# Patient Record
Sex: Female | Born: 1955
Health system: Southern US, Community
[De-identification: ages and names within clinical notes are randomized; demographics above are authoritative.]

## PROBLEM LIST (undated history)

## (undated) DIAGNOSIS — I10 Essential (primary) hypertension: Secondary | ICD-10-CM

## (undated) DIAGNOSIS — E0500 Thyrotoxicosis with diffuse goiter without thyrotoxic crisis or storm: Secondary | ICD-10-CM

## (undated) DIAGNOSIS — E039 Hypothyroidism, unspecified: Secondary | ICD-10-CM

## (undated) DIAGNOSIS — I8390 Asymptomatic varicose veins of unspecified lower extremity: Secondary | ICD-10-CM

## (undated) DIAGNOSIS — M1990 Unspecified osteoarthritis, unspecified site: Secondary | ICD-10-CM

## (undated) DIAGNOSIS — K5730 Diverticulosis of large intestine without perforation or abscess without bleeding: Secondary | ICD-10-CM

## (undated) DIAGNOSIS — J309 Allergic rhinitis, unspecified: Secondary | ICD-10-CM

## (undated) DIAGNOSIS — G4730 Sleep apnea, unspecified: Secondary | ICD-10-CM

## (undated) DIAGNOSIS — I519 Heart disease, unspecified: Secondary | ICD-10-CM

## (undated) DIAGNOSIS — I272 Pulmonary hypertension, unspecified: Secondary | ICD-10-CM

## (undated) DIAGNOSIS — M549 Dorsalgia, unspecified: Secondary | ICD-10-CM

## (undated) DIAGNOSIS — G8929 Other chronic pain: Secondary | ICD-10-CM

## (undated) DIAGNOSIS — J45990 Exercise induced bronchospasm: Secondary | ICD-10-CM

## (undated) DIAGNOSIS — E669 Obesity, unspecified: Secondary | ICD-10-CM

## (undated) DIAGNOSIS — I5032 Chronic diastolic (congestive) heart failure: Secondary | ICD-10-CM

## (undated) DIAGNOSIS — K219 Gastro-esophageal reflux disease without esophagitis: Secondary | ICD-10-CM

## (undated) HISTORY — DX: Morbid (severe) obesity due to excess calories: E66.01

## (undated) HISTORY — DX: Allergic rhinitis, unspecified: J30.9

## (undated) HISTORY — DX: Dorsalgia, unspecified: M54.9

## (undated) HISTORY — PX: ABDOMINAL HYSTERECTOMY: SHX81

## (undated) HISTORY — PX: FOOT ARTHROPLASTY: SHX1657

## (undated) HISTORY — DX: Heart disease, unspecified: I51.9

## (undated) HISTORY — PX: CHOLECYSTECTOMY: SHX55

## (undated) HISTORY — DX: Obesity, unspecified: E66.9

## (undated) HISTORY — DX: Exercise induced bronchospasm: J45.990

## (undated) HISTORY — PX: VARICOSE VEIN SURGERY: SHX832

## (undated) HISTORY — DX: Chronic diastolic (congestive) heart failure: I50.32

## (undated) HISTORY — PX: EYE SURGERY: SHX253

## (undated) HISTORY — PX: APPENDECTOMY: SHX54

## (undated) HISTORY — DX: Pulmonary hypertension, unspecified: I27.20

## (undated) HISTORY — DX: Thyrotoxicosis with diffuse goiter without thyrotoxic crisis or storm: E05.00

## (undated) HISTORY — PX: UPPER GASTROINTESTINAL ENDOSCOPY: SHX188

## (undated) HISTORY — DX: Other chronic pain: G89.29

---

## 1992-04-12 HISTORY — PX: BREAST BIOPSY: SHX20

## 2004-06-02 ENCOUNTER — Encounter: Admission: RE | Admit: 2004-06-02 | Discharge: 2004-06-02 | Payer: Self-pay | Admitting: Obstetrics and Gynecology

## 2005-03-24 ENCOUNTER — Encounter: Admission: RE | Admit: 2005-03-24 | Discharge: 2005-03-24 | Payer: Self-pay | Admitting: Obstetrics and Gynecology

## 2005-08-24 ENCOUNTER — Encounter (HOSPITAL_COMMUNITY): Admission: RE | Admit: 2005-08-24 | Discharge: 2005-11-22 | Payer: Self-pay | Admitting: Family Medicine

## 2005-09-20 ENCOUNTER — Encounter: Admission: RE | Admit: 2005-09-20 | Discharge: 2005-09-20 | Payer: Self-pay | Admitting: Family Medicine

## 2005-11-22 ENCOUNTER — Ambulatory Visit: Payer: Self-pay | Admitting: Internal Medicine

## 2006-03-07 ENCOUNTER — Ambulatory Visit: Payer: Self-pay | Admitting: Internal Medicine

## 2006-04-18 ENCOUNTER — Ambulatory Visit (HOSPITAL_COMMUNITY): Admission: RE | Admit: 2006-04-18 | Discharge: 2006-04-18 | Payer: Self-pay | Admitting: Family Medicine

## 2006-08-09 ENCOUNTER — Encounter: Admission: RE | Admit: 2006-08-09 | Discharge: 2006-11-07 | Payer: Self-pay | Admitting: Family Medicine

## 2007-05-24 ENCOUNTER — Ambulatory Visit (HOSPITAL_COMMUNITY): Admission: RE | Admit: 2007-05-24 | Discharge: 2007-05-24 | Payer: Self-pay | Admitting: Gastroenterology

## 2007-05-24 ENCOUNTER — Encounter (INDEPENDENT_AMBULATORY_CARE_PROVIDER_SITE_OTHER): Payer: Self-pay | Admitting: Gastroenterology

## 2007-09-11 ENCOUNTER — Ambulatory Visit (HOSPITAL_COMMUNITY): Admission: RE | Admit: 2007-09-11 | Discharge: 2007-09-11 | Payer: Self-pay | Admitting: Family Medicine

## 2007-11-19 ENCOUNTER — Emergency Department (HOSPITAL_COMMUNITY): Admission: EM | Admit: 2007-11-19 | Discharge: 2007-11-20 | Payer: Self-pay | Admitting: Emergency Medicine

## 2007-12-04 ENCOUNTER — Emergency Department (HOSPITAL_COMMUNITY): Admission: EM | Admit: 2007-12-04 | Discharge: 2007-12-04 | Payer: Self-pay | Admitting: Emergency Medicine

## 2008-05-03 ENCOUNTER — Ambulatory Visit (HOSPITAL_COMMUNITY): Admission: RE | Admit: 2008-05-03 | Discharge: 2008-05-03 | Payer: Self-pay | Admitting: Gastroenterology

## 2008-05-08 ENCOUNTER — Encounter (INDEPENDENT_AMBULATORY_CARE_PROVIDER_SITE_OTHER): Payer: Self-pay | Admitting: Gastroenterology

## 2008-05-08 ENCOUNTER — Ambulatory Visit (HOSPITAL_COMMUNITY): Admission: RE | Admit: 2008-05-08 | Discharge: 2008-05-08 | Payer: Self-pay | Admitting: Gastroenterology

## 2008-08-14 ENCOUNTER — Ambulatory Visit (HOSPITAL_COMMUNITY): Admission: RE | Admit: 2008-08-14 | Discharge: 2008-08-14 | Payer: Self-pay | Admitting: Gastroenterology

## 2008-10-28 ENCOUNTER — Ambulatory Visit (HOSPITAL_COMMUNITY): Admission: RE | Admit: 2008-10-28 | Discharge: 2008-10-28 | Payer: Self-pay | Admitting: Family Medicine

## 2008-11-25 ENCOUNTER — Encounter: Payer: Self-pay | Admitting: Internal Medicine

## 2009-04-21 ENCOUNTER — Emergency Department (HOSPITAL_COMMUNITY): Admission: EM | Admit: 2009-04-21 | Discharge: 2009-04-21 | Payer: Self-pay | Admitting: Family Medicine

## 2009-11-18 ENCOUNTER — Ambulatory Visit (HOSPITAL_COMMUNITY): Admission: RE | Admit: 2009-11-18 | Discharge: 2009-11-18 | Payer: Self-pay | Admitting: Family Medicine

## 2010-01-30 ENCOUNTER — Ambulatory Visit (HOSPITAL_COMMUNITY): Admission: RE | Admit: 2010-01-30 | Discharge: 2010-01-30 | Payer: Self-pay | Admitting: Orthopaedic Surgery

## 2010-05-03 ENCOUNTER — Encounter: Payer: Self-pay | Admitting: Family Medicine

## 2010-08-25 NOTE — Op Note (Signed)
NAME:  Melanie Hood, Melanie Hood                  ACCOUNT NO.:  [id number]   MEDICAL RECORD NO.:  [id number]          PATIENT TYPE:  AMB   LOCATION:  ENDO                         FACILITY:  [facility]   PHYSICIAN:  Anselmo Rod, M.D.  DATE OF BIRTH:  03-Jan-1956   DATE OF PROCEDURE:  05/24/2007  DATE OF DISCHARGE:  05/24/2007                               OPERATIVE REPORT   PROCEDURE PERFORMED:  Colonoscopy with cold biopsies x 2.   ENDOSCOPIST:  Anselmo Rod, M.D.   INSTRUMENT USED:  Pentax video colonoscope.   INDICATIONS FOR PROCEDURE:  A 55 year old [place] American female with a  family history of colon cancer in a sister undergoing screening  colonoscopy to rule out colonic polyps, mass, etc.   PREPROCEDURE PREPARATION:  Informed consent was procured from the  patient.  The patient fasted for 8 hours prior to the procedure after  being prepped with a bottle of magnesium citrate and a gallon of  NuLytely the night prior to the procedure.  The risks and benefits of  the procedure including a 10% miss rate of cancer and polyp were  discussed with the patient as well.   PREPROCEDURE PHYSICAL:  The patient had stable vital signs.  Neck  supple.  Chest clear to auscultation.  S1, S2 regular.  Abdomen soft  with normal bowel sounds.   DESCRIPTION OF PROCEDURE:  The patient was placed in left lateral  decubitus position and sedated with an additional 75 mcg of Fentanyl and  6 mg of Versed given intravenously in slow incremental doses.  Once the  patient was adequately sedated and maintained on low-flow oxygen and  continuous cardiac monitoring, the Pentax video colonoscope was advanced  from the rectum to cecum.  The appendiceal orifice and ileocecal valve  were clearly visualized and photographed.  The patient had very  redundant colon.  The procedure was technically difficult due to her  large abdomen.  Sigmoid diverticulosis was noted.  The polyp was  biopsied at 50 cm.  Retroflexion in the  rectum revealed no  abnormalities.  The patient tolerated the procedure well without  complications.  The appendiceal orifice and ileocecal valve were  visualized and photographed.   IMPRESSION:  1. Sigmoid diverticulosis.  2. Small sessile polyp biopsied at 50 cm.  3. No abnormalities noted on retroflexion.   RECOMMENDATIONS:  1. Await pathology results.  2. Repeat colonoscopy depending on pathology results.  3. Continue high fiber diet with liberal fluid intake.  4. Outpatient follow-up as need arises in the future.  5. Avoid all nonsteroidals for the next two weeks.      Anselmo Rod, M.D.  Electronically Signed     JNM/MEDQ  D:  05/30/2007  T:  05/31/2007  Job:  [id number]   cc:   Pam Drown, M.D.  Fax: [phone]

## 2010-08-25 NOTE — Op Note (Signed)
NAME:  Melanie Hood, Melanie Hood                  ACCOUNT NO.:  [id number]   MEDICAL RECORD NO.:  [id number]          PATIENT TYPE:  AMB   LOCATION:  ENDO                         FACILITY:  [facility]   PHYSICIAN:  Anselmo Rod, M.D.  DATE OF BIRTH:  04-23-1955   DATE OF PROCEDURE:  05/24/2007  DATE OF DISCHARGE:  05/24/2007                               OPERATIVE REPORT   PROCEDURE PERFORMED:  Esophagogastroduodenoscopy with multiple cold  biopsies.   ENDOSCOPIST:  Anselmo Rod, MD   INSTRUMENT USED:  Pentax video panendoscope.   INDICATIONS FOR PROCEDURE:  A 55 year old [place] American female with a  history of epigastric pain undergoing EGD to rule out peptic ulcer  disease, esophagitis, gastritis, etc.   PREPROCEDURE PREPARATION:  Informed consent was procured from the  patient.  The patient fasted for 8 hours prior to the procedure.  Risks  and benefits of the procedure were discussed with the patient in detail.   PREPROCEDURE PHYSICAL:  The patient had stable vital signs.  Neck  supple.  Chest clear to auscultation.  S1, S2 regular.  Abdomen soft  with normal bowel sounds.   DESCRIPTION OF PROCEDURE:  The patient was placed in the left lateral  decubitus position and sedated with 75 mcg of Fentanyl and 8 mg of  Versed given intravenously in slow incremental doses.  Once the patient  was adequately sedated and maintained on low flow oxygen and continuous  cardiac monitoring the Pentax video panendoscope was advanced through  the mouthpiece over the tongue into the esophagus under direct vision.  The vocal cords appeared healthy.  The entire esophagus was widely  patent.  A small patch of pinkish mucosa was biopsied above the Z-line  to rule out Barrett's esophagus.  The scope was advanced in the stomach.  There was no evidence of a hiatal hernia.  Antral gastritis was noted.  Biopsies were done to rule out presence of Helicobacter pylori by  CLOtest.  The proximal small bowel appeared  normal.  There was no  obstruction.  The patient tolerated the procedure well without  complications.   IMPRESSION:  1. Small patch of pinkish mucosa above the Z-line, biopsied to rule      out Barrett's.  2. Antral gastritis CLOtest done.  3. Normal proximal small bowel.   RECOMMENDATIONS:  1. Await pathology results.  2. Treat with antibiotics if it is CLOtest positive.  3. Proceed with colonoscopy at this time.  Further recommendations      made thereafter.      Anselmo Rod, M.D.  Electronically Signed     JNM/MEDQ  D:  05/30/2007  T:  05/31/2007  Job:  [id number]   cc:   Pam Drown, M.D.  Fax: [phone]

## 2010-08-25 NOTE — Op Note (Signed)
NAME:  Melanie Hood, Melanie Hood                  ACCOUNT NO.:  [id number]   MEDICAL RECORD NO.:  [id number]          PATIENT TYPE:  AMB   LOCATION:  ENDO                         FACILITY:  [facility]   PHYSICIAN:  Anselmo Rod, M.D.  DATE OF BIRTH:  [birth year]   DATE OF PROCEDURE:  05/08/2008  DATE OF DISCHARGE:                               OPERATIVE REPORT   PROCEDURE PERFORMED:  Esophagogastroduodenoscopy with esophageal  biopsies.   ENDOSCOPIST:  Dr. Anselmo Rod.   INSTRUMENT USED:  Pentax video panendoscope.   INDICATIONS FOR PROCEDURE:  A 55 year old black female undergoing an EGD  for dysphagia and abdominal pain, rule out peptic ulcer disease,  esophagitis, gastritis, etc.   PREPROCEDURE PREPARATION:  Informed consent was procured from the  patient.  The patient fasted for 8 hours prior to the procedure.  The  risks and benefits of the procedure were discussed with the patient in  great detail.   PREPROCEDURE PHYSICAL:  VITAL SIGNS:  The patient has stable vital  signs.  NECK:  Supple.  CHEST:  Clear to auscultation.  S1-S2 regular.  ABDOMEN:  Soft with normal bowel sounds.   DESCRIPTION OF PROCEDURE:  The patient was placed in the left lateral  decubitus position and sedated with 100 mcg of Fentanyl and 9 mg of  Versed given intravenously in slow incremental doses.  She had gargled  with 10 mL of 2% viscous lidocaine prior to the endoscopy being started.  Once the patient was adequately positioned and maintained on low-flow  oxygen and continuous cardiac monitoring, the Pentax video panendoscope  was advanced through the mouthpiece over the tongue into the esophagus  under direct vision.  The proximal and the mid esophagus appeared  normal.  A small patch of Barrett's mucosa was biopsied above the Z-line  to rule out dysplasia.  Distal esophageal biopsies were also done to  rule out eosinophilic esophagitis.  The gastric mucosa and the proximal  small bowel appeared normal.   There was no outlet obstruction.  The  patient tolerated the procedure well without complications.   IMPRESSION:  1. Widely patent esophagus with Barrett's like changes in the distal      esophagus.  Biopsies done to rule out dysplasia.  2. Distal esophageal biopsies done to rule out eosinophilic      esophagitis.  3. Normal-appearing gastric mucosa and proximal bowel.   RECOMMENDATIONS:  1. Await pathology results.  2. Avoid all nonsteroidals for now.  3. Avoid very hot or very cold liquids to prevent esophageal spasm.  4. Continue PPIs.  5. Outpatient follow-up in the next 2 weeks for further      recommendations.      Anselmo Rod, M.D.  Electronically Signed     JNM/MEDQ  D:  05/08/2008  T:  05/08/2008  Job:  [id number]   cc:   Pam Drown, M.D.  Fax: [phone]

## 2010-08-28 NOTE — Assessment & Plan Note (Signed)
[facility]                              CARDIOLOGY OFFICE NOTE   NAME:Mulvaney, Melanie Hood                         MRN:          [id number]  DATE:11/22/2005                            DOB:          04-Oct-1955    IDENTIFICATION:  The patient is a 55 year old woman who was referred by Dr.  Alberteen Sam for continued cardiac care.   The patient has a history of chest discomfort.  She has had intermittent  chest discomfort in the past.  She has had treadmill tests done.  The latest  was in 2004 that was negative for ischemia.  The patient walked into stage 3  of the Bruce protocol.   On talking about the chest discomfort the patient says this is more recent.  The chest pressure can come and go with and without activity.  Sometimes it  is a pressure.  Sometimes it is a pain.  She notes occasional reflux.  Does  not think she has been bothered by it recently.   She also has palpitations.  She has had these in the past.  She has been  told she has mitral valve prolapse attributed to this.  Echocardiogram again  in 1993 showed mild MVP, but the one prior did not.  She says these are not  too bad.   Her biggest complaint is she was diagnosed with Grave's disease this spring,  underwent radiation therapy and is being followed as in the recovery.  For  this, she is followed by Dr. Marisa Sprinkles at [place].   The patient just now is beginning to really exercise.  She denies any  problems right now with this.  No chest pain with activity.   ALLERGIES:  Erythromycin, tetracycline, NSAIDs, Tylenol with codeine.   CURRENT MEDICATIONS:  Claritin-D, multivitamin, cod liver oil and calcium.   PAST MEDICAL HISTORY:  MVP by report.  Occasional palpitations.  Allergies.  Grave's disease.   PAST SURGICAL HISTORY:  Tonsillectomy, vein ligation, gallbladder surgery,  appendectomy, hysterectomy.   FAMILY HISTORY:  Grandfather had coronary artery disease.  Had an MI in his  45s.   Was a smoker.  Grandmother had CHF question nonischemic versus  ischemic.  No records.  Mother and father are not alive.  No known history  of heart disease.  One sister alive.  One sister died of colon cancer.  One  brother alive.  One brother deceased.   SOCIAL HISTORY:  The patient is a Engineer, civil (consulting) at [employer].  She moved down  here about 2 years ago.  Quit tobacco in 1998.  Had a history of remote  marijuana use.  Drinks alcohol rarely.   REVIEW OF SYSTEMS:  All systems reviewed and are negative to the above  problem except as noted above.   PHYSICAL EXAMINATION:  GENERAL:  The patient is in no distress.  VITAL SIGNS:  Blood pressure 126/88, pulse 71 and regular.  Weight 261  pounds.  LUNGS:  Clear.  NECK:  JVP is normal.  No bruits.  CARDIAC:  Regular rate and rhythm.  S1 and S2.  No S3.  No significant  murmurs.  ABDOMEN:  Benign.  EXTREMITIES:  Good distal pulses.   A 12-lead EKG:  Normal sinus rhythm at 71 beats/min.   IMPRESSION:  1. Melanie Hood is a 55 year old woman with no known history of coronary artery      disease.  She has intermittent chest pressure that I am not convinced      is cardiac.  It does not go along with this as it comes and goes with      and without activity.  She is working out and seems to be tolerating      it.  Thyroid has thrown things off of it in interpreting because she      got short of breath when her thyroid was way off.  I do not know what      the status is now, but she is beginning to work out and tolerating it.      I will set followup to December to see how she is doing.  2. Mitral valve prolapse.  On exam I do not see any evidence of this.      Indeed if she has it it must be a very mild case, and we have discussed      this.  3. Palpitations.  Seems to be tolerating.  Do not seem to be severe.   I would like to have the patient bring her lipid panel the next time she  comes.                                Pricilla Riffle, MD,  [facility]    PVR/MedQ  DD:  11/22/2005  DT:  11/23/2005  Job #:  [id number]   cc:   Birdena Jubilee, MD  Holli Humbles

## 2010-12-11 ENCOUNTER — Other Ambulatory Visit (HOSPITAL_COMMUNITY): Payer: Self-pay | Admitting: Family Medicine

## 2010-12-11 DIAGNOSIS — Z1231 Encounter for screening mammogram for malignant neoplasm of breast: Secondary | ICD-10-CM

## 2010-12-21 ENCOUNTER — Ambulatory Visit (HOSPITAL_COMMUNITY)
Admission: RE | Admit: 2010-12-21 | Discharge: 2010-12-21 | Disposition: A | Discharge status: Home / Self Care | Payer: 59 | Source: Ambulatory Visit | Attending: Family Medicine | Admitting: Family Medicine

## 2010-12-21 DIAGNOSIS — Z1231 Encounter for screening mammogram for malignant neoplasm of breast: Secondary | ICD-10-CM | POA: Insufficient documentation

## 2011-01-01 LAB — CLOTEST (H. PYLORI), BIOPSY: Helicobacter screen: NEGATIVE

## 2011-03-18 ENCOUNTER — Other Ambulatory Visit (HOSPITAL_COMMUNITY)
Admission: RE | Admit: 2011-03-18 | Discharge: 2011-03-18 | Disposition: A | Discharge status: Home / Self Care | Payer: 59 | Source: Ambulatory Visit | Attending: Family Medicine | Admitting: Family Medicine

## 2011-03-18 ENCOUNTER — Other Ambulatory Visit: Payer: Self-pay | Admitting: Family Medicine

## 2011-03-18 DIAGNOSIS — Z124 Encounter for screening for malignant neoplasm of cervix: Secondary | ICD-10-CM | POA: Insufficient documentation

## 2011-03-25 ENCOUNTER — Other Ambulatory Visit: Payer: Self-pay | Admitting: Orthopaedic Surgery

## 2011-03-31 ENCOUNTER — Other Ambulatory Visit (HOSPITAL_COMMUNITY): Payer: Self-pay | Admitting: Gastroenterology

## 2011-04-08 ENCOUNTER — Ambulatory Visit (HOSPITAL_COMMUNITY)
Admission: RE | Admit: 2011-04-08 | Discharge: 2011-04-08 | Disposition: A | Discharge status: Home / Self Care | Payer: 59 | Source: Ambulatory Visit | Attending: Gastroenterology | Admitting: Gastroenterology

## 2011-04-08 DIAGNOSIS — J984 Other disorders of lung: Secondary | ICD-10-CM | POA: Insufficient documentation

## 2011-04-08 DIAGNOSIS — R109 Unspecified abdominal pain: Secondary | ICD-10-CM | POA: Insufficient documentation

## 2011-04-08 DIAGNOSIS — K5730 Diverticulosis of large intestine without perforation or abscess without bleeding: Secondary | ICD-10-CM | POA: Insufficient documentation

## 2011-04-15 ENCOUNTER — Other Ambulatory Visit: Payer: Self-pay

## 2011-04-15 ENCOUNTER — Encounter (HOSPITAL_BASED_OUTPATIENT_CLINIC_OR_DEPARTMENT_OTHER): Payer: Self-pay | Admitting: *Deleted

## 2011-04-15 ENCOUNTER — Encounter (HOSPITAL_BASED_OUTPATIENT_CLINIC_OR_DEPARTMENT_OTHER)
Admission: RE | Admit: 2011-04-15 | Discharge: 2011-04-15 | Disposition: A | Discharge status: Home / Self Care | Payer: 59 | Source: Ambulatory Visit | Attending: Orthopaedic Surgery | Admitting: Orthopaedic Surgery

## 2011-04-15 LAB — BASIC METABOLIC PANEL
BUN: 19 mg/dL (ref 6–23)
CO2: 26 mEq/L (ref 19–32)
Chloride: 104 mEq/L (ref 96–112)
Creatinine, Ser: 1.09 mg/dL (ref 0.50–1.10)
Potassium: 3.6 mEq/L (ref 3.5–5.1)

## 2011-04-15 NOTE — Progress Notes (Signed)
Pt used to work OR-now at [employer] To come in for bmet-ekg-had an echo with [place] [facility]-no ekg done [place] for notes

## 2011-04-18 NOTE — H&P (Signed)
Melanie Hood is an 56 y.o. female.   Chief Complaint: Right knee pain HPI: Pt has had months of right knee medial joint line pain .  MRI confirms a medial meniscus tear. She has failed conservative treatment so we have discussed going ahead with a arthroscopy  Past Medical History  Diagnosis Date  . Hypertension   . Hypothyroidism   . GERD (gastroesophageal reflux disease)   . Diverticular disease of colon   . Arthritis   . Vein, varicose     Past Surgical History  Procedure Date  . Appendectomy   . Cholecystectomy   . Abdominal hysterectomy   . Varicose vein surgery   . Foot arthroplasty     rt foot as child  . Upper gastrointestinal endoscopy     History reviewed. No pertinent family history. Social History:  reports that she has never smoked. She does not have any smokeless tobacco history on file. She reports that she does not drink alcohol or use illicit drugs.  Allergies:  Allergies  Allergen Reactions  . Betadine (Povidone Iodine)     Topical-itch  . Codeine     hallucinate  . Nsaids Nausea And Vomiting  . Septra (Sulfamethoxazole W/Trimethoprim (Co-Trimoxazole))     Ht rate up  . Tetracyclines & Related Itching  . Erythromycin Rash    No current facility-administered medications on file as of .   Medications Prior to Admission  Medication Sig Dispense Refill  . ciprofloxacin (CIPRO) 500 MG tablet Take 500 mg by mouth 2 (two) times daily.        Marland Kitchen diltiazem (CARDIZEM) 120 MG tablet Take 120 mg by mouth 1 day or 1 dose.       . gabapentin (NEURONTIN) 300 MG capsule Take 300 mg by mouth at bedtime as needed.        Marland Kitchen levothyroxine (SYNTHROID, LEVOTHROID) 150 MCG tablet Take 150 mcg by mouth daily.        Marland Kitchen loratadine-pseudoephedrine (CLARITIN-D 12-HOUR) 5-120 MG per tablet Take 1 tablet by mouth 1 day or 1 dose.        . metroNIDAZOLE (FLAGYL) 250 MG tablet Take 250 mg by mouth 3 (three) times daily.        . pantoprazole (PROTONIX) 40 MG tablet Take 40 mg by  mouth 2 (two) times daily.        Marland Kitchen therapeutic multivitamin-minerals (THERAGRAN-M) tablet Take 1 tablet by mouth daily.        . traMADol (ULTRAM) 50 MG tablet Take 50 mg by mouth every 6 (six) hours as needed.        . Vitamin D, Ergocalciferol, (DRISDOL) 50000 UNITS CAPS Take 50,000 Units by mouth every 7 (seven) days.          No results found for this or any previous visit (from the past 48 hour(s)). No results found.  Review of Systems  All other systems reviewed and are negative.    Height 5\' 10"  (1.778 m), weight 129.275 kg (285 lb). Physical Exam  Constitutional: She is oriented to person, place, and time. She appears well-developed.  HENT:  Head: Normocephalic.  Eyes: Pupils are equal, round, and reactive to light.  Neck: Normal range of motion.  Cardiovascular: Normal rate and regular rhythm.   Respiratory: Effort normal and breath sounds normal.  GI: Soft.  Musculoskeletal:       Right knee 0-120 rom. Pain along medial joint line and a positive McMurrays  Neurological: She is alert and oriented  to person, place, and time. She has normal reflexes.  Skin: Skin is warm.  Psychiatric: She has a normal mood and affect.     Assessment/Plan A: Right knee torn medial meniscus. P.Plan is to do a right knee arthroscopy to take care of the tmm.  Faye Sanfilippo R 04/18/2011, 10:31 AM

## 2011-04-20 ENCOUNTER — Ambulatory Visit (HOSPITAL_BASED_OUTPATIENT_CLINIC_OR_DEPARTMENT_OTHER): Payer: 59 | Admitting: Certified Registered"

## 2011-04-20 ENCOUNTER — Encounter (HOSPITAL_BASED_OUTPATIENT_CLINIC_OR_DEPARTMENT_OTHER)
Admission: RE | Disposition: A | Discharge status: Home / Self Care | Payer: Self-pay | Source: Ambulatory Visit | Attending: Orthopaedic Surgery

## 2011-04-20 ENCOUNTER — Ambulatory Visit (HOSPITAL_BASED_OUTPATIENT_CLINIC_OR_DEPARTMENT_OTHER)
Admission: RE | Admit: 2011-04-20 | Discharge: 2011-04-20 | Disposition: A | Discharge status: Home / Self Care | Payer: 59 | Source: Ambulatory Visit | Attending: Orthopaedic Surgery | Admitting: Orthopaedic Surgery

## 2011-04-20 ENCOUNTER — Encounter (HOSPITAL_BASED_OUTPATIENT_CLINIC_OR_DEPARTMENT_OTHER): Payer: Self-pay | Admitting: *Deleted

## 2011-04-20 ENCOUNTER — Encounter (HOSPITAL_BASED_OUTPATIENT_CLINIC_OR_DEPARTMENT_OTHER): Payer: Self-pay | Admitting: Certified Registered"

## 2011-04-20 DIAGNOSIS — M2240 Chondromalacia patellae, unspecified knee: Secondary | ICD-10-CM | POA: Insufficient documentation

## 2011-04-20 DIAGNOSIS — S83241A Other tear of medial meniscus, current injury, right knee, initial encounter: Secondary | ICD-10-CM

## 2011-04-20 DIAGNOSIS — Z01810 Encounter for preprocedural cardiovascular examination: Secondary | ICD-10-CM | POA: Insufficient documentation

## 2011-04-20 DIAGNOSIS — M23305 Other meniscus derangements, unspecified medial meniscus, unspecified knee: Secondary | ICD-10-CM | POA: Insufficient documentation

## 2011-04-20 DIAGNOSIS — Z01812 Encounter for preprocedural laboratory examination: Secondary | ICD-10-CM | POA: Insufficient documentation

## 2011-04-20 HISTORY — DX: Diverticulosis of large intestine without perforation or abscess without bleeding: K57.30

## 2011-04-20 HISTORY — PX: KNEE ARTHROSCOPY: SHX127

## 2011-04-20 HISTORY — DX: Asymptomatic varicose veins of unspecified lower extremity: I83.90

## 2011-04-20 HISTORY — DX: Gastro-esophageal reflux disease without esophagitis: K21.9

## 2011-04-20 HISTORY — DX: Unspecified osteoarthritis, unspecified site: M19.90

## 2011-04-20 HISTORY — DX: Essential (primary) hypertension: I10

## 2011-04-20 HISTORY — DX: Hypothyroidism, unspecified: E03.9

## 2011-04-20 SURGERY — ARTHROSCOPY, KNEE
Anesthesia: General | Site: Knee | Laterality: Right | Wound class: Clean

## 2011-04-20 MED ORDER — CHLORHEXIDINE GLUCONATE 4 % EX LIQD: 60.0000 mL | Freq: Once | CUTANEOUS | Status: DC

## 2011-04-20 MED ORDER — BUPIVACAINE HCL (PF) 0.5 % IJ SOLN
INTRAMUSCULAR | Status: DC | PRN
  Administered 2011-04-20: 20 mL

## 2011-04-20 MED ORDER — METHYLPREDNISOLONE ACETATE 80 MG/ML IJ SUSP
INTRAMUSCULAR | Status: DC | PRN
  Administered 2011-04-20: 80 mg via INTRA_ARTICULAR

## 2011-04-20 MED ORDER — ONDANSETRON HCL 4 MG/2ML IJ SOLN: 4.0000 mg | Freq: Once | INTRAMUSCULAR | Status: DC | PRN

## 2011-04-20 MED ORDER — LIDOCAINE HCL (CARDIAC) 20 MG/ML IV SOLN
INTRAVENOUS | Status: DC | PRN
  Administered 2011-04-20: 40 mg via INTRAVENOUS
  Administered 2011-04-20: 60 mg via INTRAVENOUS

## 2011-04-20 MED ORDER — LACTATED RINGERS IV SOLN
INTRAVENOUS | Status: DC
Start: 2011-04-20 — End: 2011-04-20

## 2011-04-20 MED ORDER — FENTANYL CITRATE 0.05 MG/ML IJ SOLN
INTRAMUSCULAR | Status: DC | PRN
  Administered 2011-04-20: 100 ug via INTRAVENOUS
  Administered 2011-04-20: 25 ug via INTRAVENOUS

## 2011-04-20 MED ORDER — HYDROMORPHONE HCL PF 1 MG/ML IJ SOLN: 0.2500 mg | INTRAMUSCULAR | Status: DC | PRN

## 2011-04-20 MED ORDER — CEFAZOLIN SODIUM-DEXTROSE 2-3 GM-% IV SOLR
2.0000 g | INTRAVENOUS | Status: AC
  Administered 2011-04-20: 2 g via INTRAVENOUS

## 2011-04-20 MED ORDER — ONDANSETRON HCL 4 MG/2ML IJ SOLN
INTRAMUSCULAR | Status: DC | PRN
  Administered 2011-04-20: 4 mg via INTRAVENOUS

## 2011-04-20 MED ORDER — MORPHINE SULFATE 2 MG/ML IJ SOLN: 0.0500 mg/kg | INTRAMUSCULAR | Status: DC | PRN

## 2011-04-20 MED ORDER — PROPOFOL 10 MG/ML IV EMUL
INTRAVENOUS | Status: DC | PRN
  Administered 2011-04-20: 300 mg via INTRAVENOUS

## 2011-04-20 MED ORDER — MEPERIDINE HCL 25 MG/ML IJ SOLN: 6.2500 mg | INTRAMUSCULAR | Status: DC | PRN

## 2011-04-20 MED ORDER — LACTATED RINGERS IV SOLN
INTRAVENOUS | Status: DC
  Administered 2011-04-20: 09:00:00 via INTRAVENOUS

## 2011-04-20 MED ORDER — DEXAMETHASONE SODIUM PHOSPHATE 4 MG/ML IJ SOLN
INTRAMUSCULAR | Status: DC | PRN
  Administered 2011-04-20: 10 mg via INTRAVENOUS

## 2011-04-20 MED ORDER — MIDAZOLAM HCL 5 MG/5ML IJ SOLN
INTRAMUSCULAR | Status: DC | PRN
  Administered 2011-04-20: 2 mg via INTRAVENOUS

## 2011-04-20 MED ORDER — CEFAZOLIN SODIUM 1-5 GM-% IV SOLN: 1.0000 g | INTRAVENOUS | Status: DC

## 2011-04-20 SURGICAL SUPPLY — 39 items
BANDAGE ELASTIC 6 VELCRO ST LF (GAUZE/BANDAGES/DRESSINGS) ×2 IMPLANT
BANDAGE GAUZE ELAST BULKY 4 IN (GAUZE/BANDAGES/DRESSINGS) ×2 IMPLANT
BLADE CUDA 5.5 (BLADE) IMPLANT
BLADE GREAT WHITE 4.2 (BLADE) ×2 IMPLANT
CANISTER OMNI JUG 16 LITER (MISCELLANEOUS) ×2 IMPLANT
CANISTER SUCTION 2500CC (MISCELLANEOUS) IMPLANT
DRAPE ARTHROSCOPY W/POUCH 114 (DRAPES) ×2 IMPLANT
DRAPE U-SHAPE 47X51 STRL (DRAPES) ×2 IMPLANT
DRSG EMULSION OIL 3X3 NADH (GAUZE/BANDAGES/DRESSINGS) ×2 IMPLANT
DURAPREP 26ML APPLICATOR (WOUND CARE) ×2 IMPLANT
ELECT MENISCUS 165MM 90D (ELECTRODE) IMPLANT
ELECT REM PT RETURN 9FT ADLT (ELECTROSURGICAL)
ELECTRODE REM PT RTRN 9FT ADLT (ELECTROSURGICAL) IMPLANT
GAUZE KERLIX 2  STERILE LF (GAUZE/BANDAGES/DRESSINGS) ×2 IMPLANT
GLOVE BIO SURGEON STRL SZ8.5 (GLOVE) ×2 IMPLANT
GLOVE BIOGEL PI IND STRL 8 (GLOVE) ×1 IMPLANT
GLOVE BIOGEL PI IND STRL 8.5 (GLOVE) ×1 IMPLANT
GLOVE BIOGEL PI INDICATOR 8 (GLOVE) ×1
GLOVE BIOGEL PI INDICATOR 8.5 (GLOVE) ×1
GLOVE ECLIPSE 6.5 STRL STRAW (GLOVE) ×2 IMPLANT
GLOVE SS BIOGEL STRL SZ 8 (GLOVE) ×1 IMPLANT
GLOVE SUPERSENSE BIOGEL SZ 8 (GLOVE) ×1
GOWN PREVENTION PLUS XLARGE (GOWN DISPOSABLE) ×4 IMPLANT
GOWN PREVENTION PLUS XXLARGE (GOWN DISPOSABLE) ×2 IMPLANT
KNEE WRAP E Z 3 GEL PACK (MISCELLANEOUS) ×2 IMPLANT
PACK ARTHROSCOPY DSU (CUSTOM PROCEDURE TRAY) ×2 IMPLANT
PACK BASIN DAY SURGERY FS (CUSTOM PROCEDURE TRAY) ×2 IMPLANT
PADDING WEBRIL 4 STERILE (GAUZE/BANDAGES/DRESSINGS) ×2 IMPLANT
PENCIL BUTTON HOLSTER BLD 10FT (ELECTRODE) IMPLANT
SET ARTHROSCOPY TUBING (MISCELLANEOUS) ×1
SET ARTHROSCOPY TUBING LN (MISCELLANEOUS) ×1 IMPLANT
SHEET MEDIUM DRAPE 40X70 STRL (DRAPES) ×2 IMPLANT
SPONGE GAUZE 4X4 12PLY (GAUZE/BANDAGES/DRESSINGS) ×2 IMPLANT
SYR 3ML 18GX1 1/2 (SYRINGE) IMPLANT
TOWEL OR 17X24 6PK STRL BLUE (TOWEL DISPOSABLE) ×2 IMPLANT
TOWEL OR NON WOVEN STRL DISP B (DISPOSABLE) ×2 IMPLANT
WAND 30 DEG SABER W/CORD (SURGICAL WAND) IMPLANT
WAND STAR VAC 90 (SURGICAL WAND) IMPLANT
WATER STERILE IRR 1000ML POUR (IV SOLUTION) ×2 IMPLANT

## 2011-04-20 NOTE — Anesthesia Procedure Notes (Signed)
Procedure Name: LMA Insertion Date/Time: 04/20/2011 10:34 AM Performed by: Radford Pax Pre-anesthesia Checklist: Patient identified, Emergency Drugs available, Suction available, Patient being monitored and Timeout performed Patient Re-evaluated:Patient Re-evaluated prior to inductionOxygen Delivery Method: Circle System Utilized Preoxygenation: Pre-oxygenation with 100% oxygen Intubation Type: IV induction Ventilation: Mask ventilation without difficulty LMA: LMA with gastric port inserted LMA Size: 4.0 Number of attempts: 1 (atraumatic) Placement Confirmation: positive ETCO2 Tube secured with: Tape (clear tape used) Dental Injury: Teeth and Oropharynx as per pre-operative assessment

## 2011-04-20 NOTE — Anesthesia Postprocedure Evaluation (Signed)
  Anesthesia Post-op Note  Patient: Melanie Hood  Procedure(s) Performed:  ARTHROSCOPY KNEE - right knee arthroscopy partial medial menisectomy and chondroplasty.  Patient Location: PACU  Anesthesia Type: General  Level of Consciousness: awake, alert  and oriented  Airway and Oxygen Therapy: Patient Spontanous Breathing  Post-op Pain: mild  Post-op Assessment: Post-op Vital signs reviewed, Patient's Cardiovascular Status Stable, Respiratory Function Stable, Patent Airway, No signs of Nausea or vomiting and Adequate PO intake  Post-op Vital Signs: Reviewed and stable  Complications: No apparent anesthesia complications

## 2011-04-20 NOTE — Interval H&P Note (Signed)
History and Physical Interval Note:  04/20/2011 9:53 AM  Melanie Hood  has presented today for surgery, with the diagnosis of right knee mmt, chondromalacia  The various methods of treatment have been discussed with the patient and family. After consideration of risks, benefits and other options for treatment, the patient has consented to  Procedure(s): ARTHROSCOPY KNEE as a surgical intervention .  The patients' history has been reviewed, patient examined, no change in status, stable for surgery.  I have reviewed the patients' chart and labs.  Questions were answered to the patient's satisfaction.     Adriel Kessen G

## 2011-04-20 NOTE — Anesthesia Preprocedure Evaluation (Signed)
Anesthesia Evaluation  Patient identified by MRN, date of birth, ID band Patient awake    Reviewed: Allergy & Precautions, H&P , NPO status , Patient's Chart, lab work & pertinent test results  Airway Mallampati: I TM Distance: >3 FB Neck ROM: Full    Dental  (+) Teeth Intact and Dental Advisory Given   Pulmonary  clear to auscultation        Cardiovascular Regular     Neuro/Psych    GI/Hepatic   Endo/Other  Morbid obesity  Renal/GU      Musculoskeletal   Abdominal   Peds  Hematology   Anesthesia Other Findings   Reproductive/Obstetrics                           Anesthesia Physical Anesthesia Plan  ASA: III  Anesthesia Plan: General   Post-op Pain Management:    Induction: Intravenous  Airway Management Planned: LMA  Additional Equipment:   Intra-op Plan:   Post-operative Plan: Extubation in OR  Informed Consent: I have reviewed the patients History and Physical, chart, labs and discussed the procedure including the risks, benefits and alternatives for the proposed anesthesia with the patient or authorized representative who has indicated his/her understanding and acceptance.   Dental advisory given  Plan Discussed with: CRNA, Anesthesiologist and Surgeon  Anesthesia Plan Comments:         Anesthesia Quick Evaluation

## 2011-04-20 NOTE — Transfer of Care (Signed)
Immediate Anesthesia Transfer of Care Note  Patient: Melanie Hood  Procedure(s) Performed:  ARTHROSCOPY KNEE - right knee arthroscopy partial medial menisectomy and chondroplasty.  Patient Location: PACU  Anesthesia Type: General  Level of Consciousness: awake, alert , oriented and patient cooperative  Airway & Oxygen Therapy: Patient Spontanous Breathing and Patient connected to face mask oxygen  Post-op Assessment: Report given to PACU RN and Post -op Vital signs reviewed and stable  Post vital signs: Reviewed and stable  Complications: No apparent anesthesia complications

## 2011-04-20 NOTE — Brief Op Note (Signed)
Melanie Hood [id number] 04/20/2011   PRE-OP DIAGNOSIS: right TMM and CP  POST-OP DIAGNOSIS: same  PROCEDURE: right knee scope PMM and AB/CP  ANESTHESIA: GA  Velna Ochs   Dictation #:  [id number]

## 2011-04-21 ENCOUNTER — Encounter (HOSPITAL_BASED_OUTPATIENT_CLINIC_OR_DEPARTMENT_OTHER): Payer: Self-pay | Admitting: Orthopaedic Surgery

## 2011-04-21 NOTE — Op Note (Signed)
Melanie Hood, Melanie Hood                  ACCOUNT NO.:  [id number]  MEDICAL RECORD NO.:  [id number]  LOCATION:  CATS                         FACILITY:  [facility]  PHYSICIAN:  Lubertha Basque. Sumer Moorehouse, M.D.DATE OF BIRTH:  03-18-1956  DATE OF PROCEDURE:  04/20/2011 DATE OF DISCHARGE:  04/08/2011                              OPERATIVE REPORT   PREOPERATIVE DIAGNOSES: 1. Right knee torn medial meniscus. 2. Right knee chondromalacia patella.  POSTOPERATIVE DIAGNOSES: 1. Right knee torn medial meniscus. 2. Right knee chondromalacia patella.  PROCEDURES: 1. Right knee partial medial meniscectomy. 2. Right knee abrasion chondroplasty patellofemoral.  ANESTHESIA:  General.  ATTENDING SURGEON:  Lubertha Basque. 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  This has persisted despite various pills and injections.  By MRI scan done some time ago, she has a medial meniscus tear along with some chondromalacia medial and patellofemoral. She has pain which limits her ability to rest and walk and she is offered an arthroscopy.  Informed operative consent was obtained after discussion of possible complications including reaction to anesthesia and infection.  SUMMARY OF FINDINGS AND PROCEDURE:  Under general anesthesia, an arthroscopy of the right knee was performed.  The suprapatellar pouch was benign while the patellofemoral joint exhibited grade 3 and 4 change across broad areas on both aspects of this joint.  A thorough chondroplasty was done along with abrasion to bleeding bone in small areas.  The medial compartment did have a far posterior horn medial meniscus tear addressed about 10% partial medial meniscectomy.  She also had some grade 3 change in this compartment and chondroplasties were done here.  The lateral compartment was benign and ACL was intact.  DESCRIPTION OF PROCEDURE:  The patient was  taken to operative suite where general anesthetic was applied without difficulty.  She was positioned supine and prepped and draped in normal sterile fashion. After administration of IV Kefzol, an arthroscopy of the right knee was performed through a total of 2 portals.  Findings were as noted above and procedure consisted predominantly of the partial medial meniscectomy done with basket and shaver.  We also performed the chondroplasty medial and abrasion chondroplasty patellofemoral to small areas of bleeding bone in the intertrochlear groove.  The knee was thoroughly irrigated, followed by placement of Marcaine with epinephrine and morphine plus Depo-Medrol.  Adaptic was placed over the portals, followed by dry gauze, and a loose Ace wrap.  Estimated blood loss and intraoperative fluids can be obtained from anesthesia records.  DISPOSITION:  The patient was extubated in the operating room and taken to recovery room in stable addition.  She was to go home the same day and follow up in the office in less than a week.  I will contact her by phone tonight.     Lubertha Basque Jerl Santos, M.D.     PGD/MEDQ  D:  04/20/2011  T:  04/21/2011  Job:  [id number]

## 2011-05-12 ENCOUNTER — Encounter: Payer: 59 | Attending: Family Medicine | Admitting: *Deleted

## 2011-05-13 ENCOUNTER — Encounter: Payer: Self-pay | Admitting: *Deleted

## 2011-05-13 NOTE — Progress Notes (Signed)
  Patient was seen on 05/12/11 for the first of a series of three diabetes self-management courses at the [facility]. The following learning objectives were met by the patient during this course:   Defines the role of glucose and insulin  Identifies type of diabetes and pathophysiology  Defines the diagnostic criteria for diabetes and prediabetes  States the risk factors for Type 2 Diabetes  States the symptoms of Type 2 Diabetes  Defines Type 2 Diabetes treatment goals  Defines Type 2 Diabetes treatment options  States the rationale for glucose monitoring  Identifies A1C, glucose targets, and testing times  Identifies proper sharps disposal  Defines the purpose of a diabetes food plan  Identifies carbohydrate food groups  Defines effects of carbohydrate foods on glucose levels  Identifies carbohydrate choices/grams/food labels  States benefits of physical activity and effect on glucose  Review of suggested activity guidelines  Last A1c: 6.3% (03/18/11 per pt; 04/15/11 @ [facility])  Handouts given during class include:  Type 2 Diabetes: Basics Book  My Food Plan Book  Food and Activity Log  My Plate Planner (tear off)  Patient has established the following initial goals:  Increase exercise.  Monitor blood glucose.  Follow a diabetes meal plan.  Lose weight.  Follow-Up Plan: Patient will attend Core Diabetes Courses II and III in Feb 2013 as scheduled or follow up prn.

## 2011-05-13 NOTE — Patient Instructions (Signed)
Patient will attend Core Diabetes Courses II and III as scheduled or follow up prn.  

## 2011-05-27 ENCOUNTER — Encounter: Payer: 59 | Attending: Family Medicine | Admitting: *Deleted

## 2011-05-31 ENCOUNTER — Encounter: Payer: Self-pay | Admitting: *Deleted

## 2011-05-31 NOTE — Progress Notes (Signed)
  Patient was seen on 05/27/2011 for the second of a series of three diabetes self-management courses at the [facility]. The following learning objectives were met by the patient during this course:   Explain basic nutrition maintenance and quality assurance  Describe causes, symptoms and treatment of hypoglycemia and hyperglycemia  Explain how to manage diabetes during illness  Describe the importance of good nutrition for health and healthy eating strategies  List strategies to follow meal plan when dining out  Describe the effects of alcohol on glucose and how to use it safely  Describe problem solving skills for day-to-day glucose challenges  Describe strategies to use when treatment plan needs to change  Identify important factors involved in successful weight loss  Describe ways to remain physically active  Describe the impact of regular activity on insulin resistance  Patient updated their initials goals from Core Class I to include:  Count Carbs and most of my meals and snacks  Increase my activity at least 4-5 times a week  Be active 30-40 minutes or more 7 times a week  Test my BG 1 time a day, 7 days a week  Handouts given in class:  Carb Counting and Beyond handout  [facility] Oral medication/insulin handout  Follow-Up Plan: Patient will attend the final class of the ADA Diabetes Self-Care Education.

## 2011-06-03 ENCOUNTER — Encounter: Payer: Self-pay | Admitting: *Deleted

## 2011-06-03 ENCOUNTER — Encounter: Payer: 59 | Admitting: *Deleted

## 2011-06-03 NOTE — Progress Notes (Signed)
  Patient was seen on 06/03/2011 for the third of a series of three diabetes self-management courses at the [facility]. The following learning objectives were met by the patient during this course:    Describe how diabetes changes over time   Identify diabetes complications and ways to prevent them   Describe strategies that can promote heart health including lowering blood pressure and cholesterol   Describe strategies to lower dietary fat and sodium in the diet   Identify physical activities that benefit cardiovascular health   Evaluate success in meeting personal goal   Describe the belief that they can live successfully with diabetes day to day   Establish 2-3 goals that they will plan to diligently work on until they return for the free 18-month follow-up visit  The following handouts were given in class:  3 Month Follow Up Visit handout  Goal setting handout  Class evaluation form  Your patient has established the following 3 month goals for diabetes self-care:  Count carbs at most of my meals and snacks  Be active 45 minutes or more 4-5 days a week  Follow-Up Plan: Patient will attend a 3 month follow-up visit for diabetes self-management education.

## 2011-09-09 ENCOUNTER — Ambulatory Visit: Payer: 59 | Admitting: *Deleted

## 2011-09-24 ENCOUNTER — Ambulatory Visit: Payer: 59 | Admitting: *Deleted

## 2011-10-18 ENCOUNTER — Ambulatory Visit: Payer: 59 | Admitting: *Deleted

## 2011-10-21 ENCOUNTER — Encounter: Payer: 59 | Attending: Family Medicine | Admitting: *Deleted

## 2011-10-21 ENCOUNTER — Encounter: Payer: Self-pay | Admitting: *Deleted

## 2011-10-21 NOTE — Patient Instructions (Signed)
Plan

## 2011-10-21 NOTE — Progress Notes (Signed)
  Patient was seen on 10/21/2011 for their 3 month follow-up as a part of the diabetes self-management courses at the [facility]. The following learning objectives were met by your patient during this course:  Patient self reports the following: Last A1c in July was 6.6% Diabetes control has improved since diabetes self-management training: YES Number of days blood glucose is >200: NONE Last MD appointment for diabetes: [facility], 2013 Changes in treatment plan: CHECKING BG 2 HOURS AFTER MEALS @ 2-3 TIMES A DAY Confidence with ability to manage diabetes: GOOD Areas for improvement with diabetes self-care: CONTINUE WITH FITNESS PAL TO TRACK FOOD AND ACTIVITY LEVELS Willingness to participate in diabetes support group: NO, THAT IS HER SWIMMING CLASS NIGHT Has increased Carb Choices to 3-4 per meal (45-60 grams) with increase in activity level Current activity level includes: [employer], step aerobics, swimming lessons, treadmill with small incline  Please see Diabetes Flow sheet for findings related to patient's self-care.  Follow-Up Plan: Patient is eligible for a 30 minute diabetes self-care appointment in the next year. Patient to call and schedule as needed.

## 2012-01-26 ENCOUNTER — Other Ambulatory Visit (HOSPITAL_COMMUNITY): Payer: Self-pay | Admitting: Family Medicine

## 2012-01-26 DIAGNOSIS — Z780 Asymptomatic menopausal state: Secondary | ICD-10-CM

## 2012-01-26 DIAGNOSIS — Z1231 Encounter for screening mammogram for malignant neoplasm of breast: Secondary | ICD-10-CM

## 2012-02-14 ENCOUNTER — Ambulatory Visit (HOSPITAL_COMMUNITY): Payer: 59

## 2012-03-08 ENCOUNTER — Other Ambulatory Visit (HOSPITAL_COMMUNITY): Payer: 59

## 2012-03-08 ENCOUNTER — Ambulatory Visit (HOSPITAL_COMMUNITY): Payer: 59

## 2012-03-23 ENCOUNTER — Ambulatory Visit (HOSPITAL_COMMUNITY)
Admission: RE | Admit: 2012-03-23 | Discharge: 2012-03-23 | Disposition: A | Discharge status: Home / Self Care | Payer: 59 | Source: Ambulatory Visit | Attending: Family Medicine | Admitting: Family Medicine

## 2012-03-23 ENCOUNTER — Other Ambulatory Visit: Payer: Self-pay | Admitting: Family Medicine

## 2012-03-23 DIAGNOSIS — Z13820 Encounter for screening for osteoporosis: Secondary | ICD-10-CM | POA: Insufficient documentation

## 2012-03-23 DIAGNOSIS — Z780 Asymptomatic menopausal state: Secondary | ICD-10-CM

## 2012-03-23 DIAGNOSIS — Z1231 Encounter for screening mammogram for malignant neoplasm of breast: Secondary | ICD-10-CM | POA: Insufficient documentation

## 2012-03-23 DIAGNOSIS — N644 Mastodynia: Secondary | ICD-10-CM

## 2012-04-07 ENCOUNTER — Ambulatory Visit
Admission: RE | Admit: 2012-04-07 | Discharge: 2012-04-07 | Disposition: A | Discharge status: Home / Self Care | Payer: 59 | Source: Ambulatory Visit | Attending: Family Medicine | Admitting: Family Medicine

## 2012-04-07 DIAGNOSIS — N644 Mastodynia: Secondary | ICD-10-CM

## 2012-04-28 ENCOUNTER — Other Ambulatory Visit (HOSPITAL_COMMUNITY): Payer: Self-pay | Admitting: *Deleted

## 2012-04-28 DIAGNOSIS — R109 Unspecified abdominal pain: Secondary | ICD-10-CM

## 2012-05-03 ENCOUNTER — Encounter (HOSPITAL_COMMUNITY): Payer: Self-pay

## 2012-05-03 ENCOUNTER — Other Ambulatory Visit (HOSPITAL_COMMUNITY): Payer: Self-pay | Admitting: Gastroenterology

## 2012-05-03 ENCOUNTER — Ambulatory Visit (HOSPITAL_COMMUNITY): Payer: 59

## 2012-05-03 ENCOUNTER — Ambulatory Visit (HOSPITAL_COMMUNITY)
Admission: RE | Admit: 2012-05-03 | Discharge: 2012-05-03 | Disposition: A | Discharge status: Home / Self Care | Payer: 59 | Source: Ambulatory Visit | Attending: Family Medicine | Admitting: Family Medicine

## 2012-05-03 DIAGNOSIS — R1013 Epigastric pain: Secondary | ICD-10-CM | POA: Insufficient documentation

## 2012-05-03 DIAGNOSIS — R109 Unspecified abdominal pain: Secondary | ICD-10-CM

## 2012-05-03 DIAGNOSIS — R911 Solitary pulmonary nodule: Secondary | ICD-10-CM | POA: Insufficient documentation

## 2012-05-03 DIAGNOSIS — K5730 Diverticulosis of large intestine without perforation or abscess without bleeding: Secondary | ICD-10-CM | POA: Insufficient documentation

## 2012-05-03 DIAGNOSIS — Z90710 Acquired absence of both cervix and uterus: Secondary | ICD-10-CM | POA: Insufficient documentation

## 2012-05-03 MED ORDER — IOHEXOL 300 MG/ML  SOLN
100.0000 mL | Freq: Once | INTRAMUSCULAR | Status: AC | PRN
  Administered 2012-05-03: 100 mL via INTRAVENOUS

## 2012-05-04 ENCOUNTER — Other Ambulatory Visit: Payer: Self-pay | Admitting: Dermatology

## 2013-02-12 ENCOUNTER — Ambulatory Visit (INDEPENDENT_AMBULATORY_CARE_PROVIDER_SITE_OTHER): Payer: Self-pay | Admitting: Family Medicine

## 2013-02-12 DIAGNOSIS — E119 Type 2 diabetes mellitus without complications: Secondary | ICD-10-CM

## 2013-02-14 NOTE — Progress Notes (Signed)
Subjective/Objective:  Patient presents today at the [facility] for 3 month DM follow-up through the employer-sponsored Link to Wellness program. Patient is in good spirits. Admits that she had been struggling in previous months due to the anniversary of her grandson's death and had "fallen off the wagon." She is now back on track with managing her diabetes and other health conditions. Currently takes metformin ER for diabetes management and is taking ACE-inhibitor and a daily aspirin.  Last A1c (6.4%) was done at appointment with Dr. Corliss Blacker in April. Patient is due for A1c check and all labs in December. Offered to do POC A1c check here at the pharmacy, but patient wanted to wait and do A1c when she has all other lab work done before appointment with Dr. Corliss Blacker in December.  BP 114/81; P 71; Ht 5'10"; Wt 276 lbs; 7 day CBG average 98; 14 day CBG average 98; 30 day CBG average 98   Assessment:  Diagnosed with Type 2 Diabetes in 2010. Currently sees Dr. Corliss Blacker 2 times per year. Patient is managing diabetes fairly well. Last reported A1c was 6.4% in April. Patient sees Dr. Corliss Blacker for lab work and exam in early December and will get A1c at that time. Did not want POC A1c at today's visit. Patient admits that in previous months she had "fallen off the wagon" at the one year anniversary of her grandson's death. Since that time, she is now back on track with managing her diabetes and other medical conditions. Patient is very pleased at her recent weight loss of almost 20 pounds. She is highly knowledgeable about diet and carbohydrate counting. She usually consumes 45-60 grams of carbohydrates per meal when she exercises and decreases to 30-45 grams of carbohydrates per meal when she does not exercise. Patient is currently exercising about 3-4 times per week. She normally walks on the treadmill as tolerated and uses the stationary bike. Patient has knee problems and is in need of a right knee  replacement currently. She cannot have the surgery until she loses weight. She does have a "chair gym" at home that she uses for resistance and strengthening. Of note, patient states that she may begin drinking a 4-ounce glass of red wine a night due to a recent study she saw that showed the benefits of drinking one glass of red wine per day.  Checks blood glucose 2 times a day with her highest CBG being 108 and lowest CBG 98. Patient reports no hypoglycemia but has hypoglycemia awareness. Currently takes metformin ER 500mg  at bedtime to control diabetes. Has dental appointment in December. Annual eye exam is due in November, and patient will make appointment. Checks feet daily. Is on a daily aspirin and ACE-inhibitor.  Plan:  1. Goal weight of 250 pounds within 3 months of today's appointment (02/08/13). 2. Make yearly eye exam appointment. 3. Follow up with Paulino Door at Outpatient Pharmacy in 3 months. Morrie Sheldon will call patient to make appointment.

## 2013-03-20 ENCOUNTER — Other Ambulatory Visit (HOSPITAL_COMMUNITY): Payer: Self-pay | Admitting: Family Medicine

## 2013-03-20 DIAGNOSIS — Z1231 Encounter for screening mammogram for malignant neoplasm of breast: Secondary | ICD-10-CM

## 2013-04-09 ENCOUNTER — Ambulatory Visit (HOSPITAL_COMMUNITY)
Admission: RE | Admit: 2013-04-09 | Discharge: 2013-04-09 | Disposition: A | Discharge status: Home / Self Care | Payer: 59 | Source: Ambulatory Visit | Attending: Family Medicine | Admitting: Family Medicine

## 2013-04-09 DIAGNOSIS — Z1231 Encounter for screening mammogram for malignant neoplasm of breast: Secondary | ICD-10-CM | POA: Insufficient documentation

## 2013-06-28 ENCOUNTER — Ambulatory Visit (INDEPENDENT_AMBULATORY_CARE_PROVIDER_SITE_OTHER): Payer: Self-pay | Admitting: Family Medicine

## 2013-06-28 DIAGNOSIS — IMO0001 Reserved for inherently not codable concepts without codable children: Secondary | ICD-10-CM

## 2013-06-28 DIAGNOSIS — E1165 Type 2 diabetes mellitus with hyperglycemia: Principal | ICD-10-CM

## 2013-06-28 NOTE — Progress Notes (Signed)
Subjective/Objective:  Patient presents today at the [facility] for 3 month DM follow-up through the employer-sponsored Link to Wellness program. Patient admits that she had been struggling with management of diabetes in previous months due to the loss of her cat that she had for 18 years. She is now ready to get back on track with managing her diabetes and other health conditions. Patient currently takes metformin ER for diabetes medication management.  A1c: 6.4% (03/28/13); BP 110/70 mmHg; P 82 bpm; Ht: 5'9"; Wt 296 lbs; BMI 43.9; Pain 3/10  Fasting average home blood glucose readings are as follows: 7-day: 104; 14-day: 105; 30-day: 110   Assessment:  Patient is managing diabetes fairly well considering patient had some setbacks since our last visit. Last reported A1c was 6.4% in December at visit with Dr. Addison Lank. Patient will see Dr. Addison Lank for lab work and exam in early April and will get A1c at that time. Patient admits that in previous months she had "fallen off the wagon" since the passing of her cat of 18 years. She has been slightly depressed and unmotivated since that time and has not been exercising or being compliant with her diet. She is going to start seeing her counselor again to work through some of her emotional issues. Patient has gained about 20 pounds since her last visit with me. However, she is now ready to get back on track with managing her diabetes and other medical conditions. She is highly knowledgeable about diet and carbohydrate counting. Patient is joining [employer] and actually has her first meeting tomorrow (06/29/13). Patient has just recently started back exercising about 3-4 times per week. She normally walks on the treadmill as tolerated and uses the stationary bike. Patient has knee problems and is in need of a right knee replacement currently. She cannot have the surgery until she loses weight. Checks blood sugar approximately two times daily  (fasting and post-prandial). 7, 14, and 30-day CBG averages are 104, 105, and 110, respectively. Patient reports no hypoglycemia but has hypoglycemia awareness. Currently takes metformin ER 500mg  at bedtime to control diabetes. Has dental appointment every 3 months. Annual eye exam was due in November, and patient will make appointment within the next month. Checks feet daily. Is on a daily aspirin and ACE-inhibitor.    Plan:  Patient's personal goals before next visit are as follows: 1. Goal weight loss: 30 pounds within 3 months starting today.  2. Decrease blood sugars by 10 points.  3. Make yearly eye exam appointment.  4. Exercise 3-4 times a week (treadmill and stationary bike).  5. Follow up with Caryl Pina at the Outpatient Pharmacy on Thursday, June 18th at 8:30am

## 2013-08-07 ENCOUNTER — Other Ambulatory Visit: Payer: Self-pay | Admitting: Family Medicine

## 2013-08-07 ENCOUNTER — Other Ambulatory Visit (HOSPITAL_COMMUNITY)
Admission: RE | Admit: 2013-08-07 | Discharge: 2013-08-07 | Disposition: A | Discharge status: Home / Self Care | Payer: 59 | Source: Ambulatory Visit | Attending: Family Medicine | Admitting: Family Medicine

## 2013-08-07 DIAGNOSIS — Z124 Encounter for screening for malignant neoplasm of cervix: Secondary | ICD-10-CM | POA: Insufficient documentation

## 2013-10-16 ENCOUNTER — Ambulatory Visit (INDEPENDENT_AMBULATORY_CARE_PROVIDER_SITE_OTHER): Payer: Self-pay | Admitting: Family Medicine

## 2013-10-16 VITALS — BP 118/72 | Wt 295.0 lb

## 2013-10-16 DIAGNOSIS — E119 Type 2 diabetes mellitus without complications: Secondary | ICD-10-CM

## 2013-10-16 NOTE — Progress Notes (Signed)
Patient presents today for 3 month diabetes follow-up as part of the employer-sponsored Link to Wellness program. Current diabetes regimen includes Metformin. Patient also continues on daily ASA and ACEi. Pt is not on a statin at this time, but will be evaluated for one at her next PCP visit due to elevated labs at previous visit. Most recent MD follow-up was March 2015. Patient has a pending appt for October. No med changes at this time. Of note, patient has recently been struggling with depression but seems to be improving, she also has underwent colonoscopy and endoscopy for blood in stool. Results were clear, and pt was very relieved. Patient has also started a weight loss competition with a coworker and is working hard to lose weight and gain better control of health.   Diabetes Assessment: Type of Diabetes: Type 2; Year of diagnosis 2010; [employer] classes [place]; uses glucometer; takes medications as prescribed; takes an aspirin a day; checks feet weekly; Sees Diabetes provider 2 times per year; MD managing Diabetes Dr. Theadore [facility]; checks blood glucose 2 times a day; A1c 6.6 (prev 6.4) Other Diabetes History: Current med regimen includes Metformin ER 500 mg daily. Patient does maintain good medication compliance. Patient did bring meter today and is currently testing 1-2 times per day; however, she does sometimes get off course and neglects to test daily. Glucose monitoring occurs fasting/post-prandial/when symptomatic. Fasting glucose typically runs 90s and post prandial generally 110-120s. Hypoglycemia frequency is rare. Patient does demonstrate appropriate correction of hypoglycemia. Patient denies signs and symptoms of neuropathy including numbness/tingling/burning and symptoms of foot infection. Patient is not due for yearly eye exam. A1c has increased slightly today, but is still at goal of <7.0, with a reading of 6.6 via point-of-care.   Lifestyle  Factors: Exercise - Patient has increased physical activity and is now exercising three days per week. She is using treadmill for 30 min, and stationary bike for 10 minutes three days per week, and is working up to 5 days per week. She owns her own exercise equipment and admits there is no reason not to exercise. She also enjoys zumba on xbox, which she modifies to accomadate her knee pain. Also, patient will begin seeing a personal trainer 1-2 times per week, starting this week.  Diet - Patient is making dietary changes. She is eating more fruit and vegetables including cherries, watermelon, brocolli (roasted with onion and olive oil), and spinach. She is also making mason jar salads for lunch. She is also limiting fried foods and has fried fish or chicken only once every 2-3 weeks, she mostly grills. Patient is attempting to limit sweets and is trying to avoid baking and is instead choosing 100 calorie snacks. Patient is a stress eater and compares her food addiction to being an alcoholic. I hope patient will be able to stay on track with diet.   Assessment: Patient presents today with increased A1c of 6.6 (prev 6.4), but remains at goal of <7.0. Patient has struggled with lifestyle over the past several months, but is getting back on track now. She is dieting, exercising 3 times per week, has entered a weight loss challenge, and seems to be doing well overall. She will attempt to continue on this path and will follow-up in three months.  Plan: 1) Continue making healthy dietary choices and limiting sweets, great job with changes in this area! 2) Continue exercising at least three days per week, attempt to work up to 5 days per  week 3) Continue taking medications every day 4) Continue testing regularly 5) Follow-up with [facility] in October on Thursday Oct 8th @ 4:00 pm Wt loss goal of 30 lbs over next three months

## 2013-11-19 ENCOUNTER — Other Ambulatory Visit: Payer: Self-pay | Admitting: Orthopaedic Surgery

## 2013-12-03 ENCOUNTER — Other Ambulatory Visit (HOSPITAL_COMMUNITY): Payer: Self-pay | Admitting: *Deleted

## 2013-12-03 ENCOUNTER — Encounter (HOSPITAL_COMMUNITY): Payer: Self-pay | Admitting: Pharmacy Technician

## 2013-12-03 NOTE — Pre-Procedure Instructions (Signed)
Melanie Hood  12/03/2013   Your procedure is scheduled on:  Tuesday, December 11, 2013 at 10:15 AM.   Report to [facility] Entrance "A" Admitting Office at 8:15 AM.   Call this number if you have problems the morning of surgery: [phone]   Remember:   Do not eat food or drink liquids after midnight Monday, 12/10/13.   Take these medicines the morning of surgery with A SIP OF WATER: cetirizine (ZYRTEC), diltiazem (CARDIZEM LA), levothyroxine (SYNTHROID, LEVOTHROID),  valACYclovir (VALTREX), pantoprazole (PROTONIX), HYDROcodone-acetaminophen (NORCO) or traMADol (ULTRAM) - if needed, albuterol (PROVENTIL HFA;VENTOLIN HFA) inhaler - if needed.  Stop Aspirin and Vitamins as of today.  Please bring Albuterol inhaler with you day of surgery.    Do not wear jewelry, make-up or nail polish.  Do not wear lotions, powders, or perfumes. You may wear deodorant.  Do not shave 48 hours prior to surgery.   Do not bring valuables to the hospital.  [facility] is not responsible                  for any belongings or valuables.               Contacts, dentures or bridgework may not be worn into surgery.  Leave suitcase in the car. After surgery it may be brought to your room.  For patients admitted to the hospital, discharge time is determined by your                treatment team.             Special Instructions: Melanie Hood - Preparing for Surgery  Before surgery, you can play an important role.  Because skin is not sterile, your skin needs to be as free of germs as possible.  You can reduce the number of germs on you skin by washing with CHG (chlorahexidine gluconate) soap before surgery.  CHG is an antiseptic cleaner which kills germs and bonds with the skin to continue killing germs even after washing.  Please DO NOT use if you have an allergy to CHG or antibacterial soaps.  If your skin becomes reddened/irritated stop using the CHG and inform your nurse when you arrive at Short  Stay.  Do not shave (including legs and underarms) for at least 48 hours prior to the first CHG shower.  You may shave your face.  Please follow these instructions carefully:   1.  Shower with CHG Soap the night before surgery and the                                morning of Surgery.  2.  If you choose to wash your hair, wash your hair first as usual with your       normal shampoo.  3.  After you shampoo, rinse your hair and body thoroughly to remove the                      Shampoo.  4.  Use CHG as you would any other liquid soap.  You can apply chg directly       to the skin and wash gently with scrungie or a clean washcloth.  5.  Apply the CHG Soap to your body ONLY FROM THE NECK DOWN.        Do not use on open wounds or open sores.  Avoid contact with your  eyes, ears, mouth and genitals (private parts).  Wash genitals (private parts) with your normal soap.  6.  Wash thoroughly, paying special attention to the area where your surgery        will be performed.  7.  Thoroughly rinse your body with warm water from the neck down.  8.  DO NOT shower/wash with your normal soap after using and rinsing off       the CHG Soap.  9.  Pat yourself dry with a clean towel.            10.  Wear clean pajamas.            11.  Place clean sheets on your bed the night of your first shower and do not        sleep with pets.  Day of Surgery  Do not apply any lotions the morning of surgery.  Please wear clean clothes to the hospital/surgery center.     Please read over the following fact sheets that you were given: Pain Booklet, Coughing and Deep Breathing, Blood Transfusion Information, MRSA Information and Surgical Site Infection Prevention

## 2013-12-04 ENCOUNTER — Encounter (HOSPITAL_COMMUNITY): Payer: Self-pay

## 2013-12-04 ENCOUNTER — Encounter (HOSPITAL_COMMUNITY)
Admission: RE | Admit: 2013-12-04 | Discharge: 2013-12-04 | Disposition: A | Discharge status: Home / Self Care | Payer: 59 | Source: Ambulatory Visit | Attending: Orthopaedic Surgery | Admitting: Orthopaedic Surgery

## 2013-12-04 DIAGNOSIS — M1710 Unilateral primary osteoarthritis, unspecified knee: Secondary | ICD-10-CM | POA: Diagnosis not present

## 2013-12-04 DIAGNOSIS — Z01818 Encounter for other preprocedural examination: Secondary | ICD-10-CM | POA: Diagnosis present

## 2013-12-04 HISTORY — DX: Sleep apnea, unspecified: G47.30

## 2013-12-04 LAB — URINALYSIS, ROUTINE W REFLEX MICROSCOPIC
Bilirubin Urine: NEGATIVE
GLUCOSE, UA: NEGATIVE mg/dL
HGB URINE DIPSTICK: NEGATIVE
Ketones, ur: NEGATIVE mg/dL
Leukocytes, UA: NEGATIVE
Nitrite: NEGATIVE
Protein, ur: NEGATIVE mg/dL
SPECIFIC GRAVITY, URINE: 1.022 (ref 1.005–1.030)
Urobilinogen, UA: 0.2 mg/dL (ref 0.0–1.0)
pH: 5 (ref 5.0–8.0)

## 2013-12-04 LAB — CBC WITH DIFFERENTIAL/PLATELET
BASOS ABS: 0 10*3/uL (ref 0.0–0.1)
BASOS PCT: 0 % (ref 0–1)
EOS PCT: 2 % (ref 0–5)
Eosinophils Absolute: 0.1 10*3/uL (ref 0.0–0.7)
HEMATOCRIT: 37.5 % (ref 36.0–46.0)
Hemoglobin: 12.4 g/dL (ref 12.0–15.0)
Lymphocytes Relative: 39 % (ref 12–46)
Lymphs Abs: 3 10*3/uL (ref 0.7–4.0)
MCH: 29.2 pg (ref 26.0–34.0)
MCHC: 33.1 g/dL (ref 30.0–36.0)
MCV: 88.2 fL (ref 78.0–100.0)
MONO ABS: 0.7 10*3/uL (ref 0.1–1.0)
Monocytes Relative: 9 % (ref 3–12)
Neutro Abs: 3.8 10*3/uL (ref 1.7–7.7)
Neutrophils Relative %: 50 % (ref 43–77)
PLATELETS: 266 10*3/uL (ref 150–400)
RBC: 4.25 MIL/uL (ref 3.87–5.11)
RDW: 13.9 % (ref 11.5–15.5)
WBC: 7.6 10*3/uL (ref 4.0–10.5)

## 2013-12-04 LAB — BASIC METABOLIC PANEL
Anion gap: 13 (ref 5–15)
BUN: 12 mg/dL (ref 6–23)
CALCIUM: 9 mg/dL (ref 8.4–10.5)
CO2: 23 meq/L (ref 19–32)
Chloride: 104 mEq/L (ref 96–112)
Creatinine, Ser: 0.83 mg/dL (ref 0.50–1.10)
GFR calc Af Amer: 88 mL/min — ABNORMAL LOW (ref >90)
GFR, EST NON AFRICAN AMERICAN: 76 mL/min — AB (ref >90)
Glucose, Bld: 108 mg/dL — ABNORMAL HIGH (ref 70–99)
Potassium: 3.9 mEq/L (ref 3.7–5.3)
SODIUM: 140 meq/L (ref 137–147)

## 2013-12-04 LAB — SURGICAL PCR SCREEN
MRSA, PCR: NEGATIVE
STAPHYLOCOCCUS AUREUS: NEGATIVE

## 2013-12-04 LAB — PROTIME-INR
INR: 1.08 (ref 0.00–1.49)
PROTHROMBIN TIME: 14 s (ref 11.6–15.2)

## 2013-12-04 LAB — APTT: APTT: 30 s (ref 24–37)

## 2013-12-04 LAB — TYPE AND SCREEN
ABO/RH(D): B POS
Antibody Screen: NEGATIVE

## 2013-12-04 LAB — ABO/RH: ABO/RH(D): B POS

## 2013-12-06 ENCOUNTER — Encounter: Payer: Self-pay | Admitting: Family Medicine

## 2013-12-06 NOTE — Progress Notes (Signed)
Patient ID: Melanie Hood, female   DOB: August 06, 1955, 58 y.o.   MRN: [id number] Reviewed: Agree with the documentation and management by my [place].

## 2013-12-06 NOTE — Progress Notes (Signed)
Patient ID: Melanie Hood, female   DOB: October 22, 1955, 58 y.o.   MRN: [id number] Reviewed: Agree with the documentation and management by my [place].

## 2013-12-06 NOTE — Progress Notes (Signed)
Patient ID: Melanie Hood, female   DOB: 10/01/1955, 58 y.o.   MRN: [id number] Reviewed: Agree with the documentation and management by my [place].

## 2013-12-10 MED ORDER — DEXTROSE 5 % IV SOLN
3.0000 g | INTRAVENOUS | Status: AC
  Administered 2013-12-11: 3 g via INTRAVENOUS
  Filled 2013-12-10: qty 3000

## 2013-12-10 NOTE — H&P (Signed)
TOTAL KNEE ADMISSION H&P  Patient is being admitted for right total knee arthroplasty.  Subjective:  Chief Complaint:right knee pain.  HPI: Melanie Hood, 58 y.o. female, has a history of pain and functional disability in the right knee due to arthritis and has failed non-surgical conservative treatments for greater than 12 weeks to includeNSAID's and/or analgesics, corticosteriod injections, viscosupplementation injections, flexibility and strengthening excercises, weight reduction as appropriate and activity modification.  Onset of symptoms was gradual, starting 5 years ago with gradually worsening course since that time. The patient noted prior procedures on the knee to include  arthroscopy on the right knee(s).  Patient currently rates pain in the right knee(s) at 10 out of 10 with activity. Patient has night pain, worsening of pain with activity and weight bearing, pain that interferes with activities of daily living, crepitus and joint swelling.  Patient has evidence of subchondral cysts, subchondral sclerosis, periarticular osteophytes and joint space narrowing by imaging studies. There is no active infection.  Patient Active Problem List   Diagnosis Date Noted  . Diabetes 10/16/2013   Past Medical History  Diagnosis Date  . Hypothyroidism   . GERD (gastroesophageal reflux disease)   . Diverticular disease of colon   . Arthritis   . Vein, varicose   . Heart disease   . Hypertension     mild left vent dysfunction   . Sleep apnea   . Diabetes mellitus     type 11     Past Surgical History  Procedure Laterality Date  . Appendectomy    . Cholecystectomy    . Abdominal hysterectomy    . Varicose vein surgery    . Foot arthroplasty      rt foot as child  . Upper gastrointestinal endoscopy    . Knee arthroscopy  04/20/2011    Procedure: ARTHROSCOPY KNEE;  Surgeon: Hessie Dibble, MD;  Location: [place];  Service: Orthopedics;  Laterality: Right;  right knee  arthroscopy partial medial menisectomy and chondroplasty.    No prescriptions prior to admission   Allergies  Allergen Reactions  . Codeine     hallucinate  . Nsaids Nausea And Vomiting  . Septra [Sulfamethoxazole W/Trimethoprim (Co-Trimoxazole)]     Ht rate up  . Tetracyclines & Related Itching  . Erythromycin     abd pain    History  Substance Use Topics  . Smoking status: Former Research scientist (life sciences)  . Smokeless tobacco: Not on file  . Alcohol Use: Yes     Comment: rarely     No family history on file.   Review of Systems  Musculoskeletal: Positive for joint pain.  All other systems reviewed and are negative.   Objective:  Physical Exam  Constitutional: She is oriented to person, place, and time.  HENT:  Head: Normocephalic and atraumatic.  Eyes: Conjunctivae are normal. Pupils are equal, round, and reactive to light.  Neck: Normal range of motion.  Cardiovascular: Normal rate and regular rhythm.   Respiratory: Effort normal.  Musculoskeletal:  Right knee motion is about 0-95. She has trace effusion. There is some crepitation and medial joint line pain. Hip motion is good and straight leg raise is negative. Sensation and motor function are intact in her feet with palpable pulses on both sides. There is no palpable lymphadenopathy behind her knee.   Neurological: She is alert and oriented to person, place, and time.  Skin: Skin is warm and dry.  Psychiatric: She has a normal mood and affect. Her  behavior is normal. Judgment and thought content normal.    Vital signs in last 24 hours:    Labs:   Estimated body mass index is 42.95 kg/(m^2) as calculated from the following:   Height as of 10/21/11: 5' 9.5" (1.765 m).   Weight as of 10/16/13: 133.811 kg (295 lb).   Imaging Review Plain radiographs demonstrate severe degenerative joint disease of the right knee(s). The overall alignment ismild varus. The bone quality appears to be good for age and reported activity  level.  Assessment/Plan:  End stage arthritis, right knee   The patient history, physical examination, clinical judgment of the provider and imaging studies are consistent with end stage degenerative joint disease of the right knee(s) and total knee arthroplasty is deemed medically necessary. The treatment options including medical management, injection therapy arthroscopy and arthroplasty were discussed at length. The risks and benefits of total knee arthroplasty were presented and reviewed. The risks due to aseptic loosening, infection, stiffness, patella tracking problems, thromboembolic complications and other imponderables were discussed. The patient acknowledged the explanation, agreed to proceed with the plan and consent was signed. Patient is being admitted for inpatient treatment for surgery, pain control, PT, OT, prophylactic antibiotics, VTE prophylaxis, progressive ambulation and ADL's and discharge planning. The patient is planning to be discharged home with home health services

## 2013-12-11 ENCOUNTER — Encounter (HOSPITAL_COMMUNITY)
Admission: RE | Disposition: A | Discharge status: Home Health | Payer: Self-pay | Source: Ambulatory Visit | Attending: Orthopaedic Surgery

## 2013-12-11 ENCOUNTER — Inpatient Hospital Stay (HOSPITAL_COMMUNITY): Payer: 59 | Admitting: Anesthesiology

## 2013-12-11 ENCOUNTER — Encounter (HOSPITAL_COMMUNITY): Payer: 59 | Admitting: Anesthesiology

## 2013-12-11 ENCOUNTER — Encounter (HOSPITAL_COMMUNITY): Payer: Self-pay | Admitting: *Deleted

## 2013-12-11 ENCOUNTER — Inpatient Hospital Stay (HOSPITAL_COMMUNITY)
Admission: RE | Admit: 2013-12-11 | Discharge: 2013-12-13 | DRG: 470 | Disposition: A | Discharge status: Home Health | Payer: 59 | Source: Ambulatory Visit | Attending: Orthopaedic Surgery | Admitting: Orthopaedic Surgery

## 2013-12-11 DIAGNOSIS — M1711 Unilateral primary osteoarthritis, right knee: Secondary | ICD-10-CM

## 2013-12-11 DIAGNOSIS — Z7982 Long term (current) use of aspirin: Secondary | ICD-10-CM | POA: Diagnosis not present

## 2013-12-11 DIAGNOSIS — K219 Gastro-esophageal reflux disease without esophagitis: Secondary | ICD-10-CM | POA: Diagnosis present

## 2013-12-11 DIAGNOSIS — E039 Hypothyroidism, unspecified: Secondary | ICD-10-CM | POA: Diagnosis present

## 2013-12-11 DIAGNOSIS — Z886 Allergy status to analgesic agent status: Secondary | ICD-10-CM

## 2013-12-11 DIAGNOSIS — Z79899 Other long term (current) drug therapy: Secondary | ICD-10-CM | POA: Diagnosis not present

## 2013-12-11 DIAGNOSIS — Z87891 Personal history of nicotine dependence: Secondary | ICD-10-CM

## 2013-12-11 DIAGNOSIS — Z888 Allergy status to other drugs, medicaments and biological substances status: Secondary | ICD-10-CM

## 2013-12-11 DIAGNOSIS — I10 Essential (primary) hypertension: Secondary | ICD-10-CM | POA: Diagnosis present

## 2013-12-11 DIAGNOSIS — G8918 Other acute postprocedural pain: Secondary | ICD-10-CM | POA: Diagnosis not present

## 2013-12-11 DIAGNOSIS — Z6841 Body Mass Index (BMI) 40.0 and over, adult: Secondary | ICD-10-CM | POA: Diagnosis not present

## 2013-12-11 DIAGNOSIS — M25569 Pain in unspecified knee: Secondary | ICD-10-CM | POA: Diagnosis present

## 2013-12-11 DIAGNOSIS — G4730 Sleep apnea, unspecified: Secondary | ICD-10-CM | POA: Diagnosis present

## 2013-12-11 DIAGNOSIS — K5730 Diverticulosis of large intestine without perforation or abscess without bleeding: Secondary | ICD-10-CM | POA: Diagnosis present

## 2013-12-11 DIAGNOSIS — E119 Type 2 diabetes mellitus without complications: Secondary | ICD-10-CM | POA: Diagnosis present

## 2013-12-11 DIAGNOSIS — M1710 Unilateral primary osteoarthritis, unspecified knee: Principal | ICD-10-CM | POA: Diagnosis present

## 2013-12-11 HISTORY — PX: TOTAL KNEE ARTHROPLASTY: SHX125

## 2013-12-11 LAB — GLUCOSE, CAPILLARY
GLUCOSE-CAPILLARY: 106 mg/dL — AB (ref 70–99)
GLUCOSE-CAPILLARY: 161 mg/dL — AB (ref 70–99)
Glucose-Capillary: 113 mg/dL — ABNORMAL HIGH (ref 70–99)
Glucose-Capillary: 100 mg/dL — ABNORMAL HIGH (ref 70–99)

## 2013-12-11 SURGERY — ARTHROPLASTY, KNEE, TOTAL
Anesthesia: General | Laterality: Right

## 2013-12-11 MED ORDER — INSULIN ASPART 100 UNIT/ML ~~LOC~~ SOLN
0.0000 [IU] | Freq: Three times a day (TID) | SUBCUTANEOUS | Status: DC
  Administered 2013-12-12: 4 [IU] via SUBCUTANEOUS
  Administered 2013-12-12 – 2013-12-13 (×4): 3 [IU] via SUBCUTANEOUS

## 2013-12-11 MED ORDER — SUCCINYLCHOLINE CHLORIDE 20 MG/ML IJ SOLN
INTRAMUSCULAR | Status: AC
  Filled 2013-12-11: qty 1

## 2013-12-11 MED ORDER — ARTIFICIAL TEARS OP OINT
TOPICAL_OINTMENT | OPHTHALMIC | Status: DC | PRN
  Administered 2013-12-11: 1 via OPHTHALMIC

## 2013-12-11 MED ORDER — ACETAMINOPHEN 325 MG PO TABS: 650.0000 mg | ORAL_TABLET | Freq: Four times a day (QID) | ORAL | Status: DC | PRN

## 2013-12-11 MED ORDER — TRANEXAMIC ACID 100 MG/ML IV SOLN
1000.0000 mg | INTRAVENOUS | Status: AC
  Administered 2013-12-11: 1000 mg via INTRAVENOUS
  Filled 2013-12-11: qty 10

## 2013-12-11 MED ORDER — VALACYCLOVIR HCL 500 MG PO TABS
500.0000 mg | ORAL_TABLET | Freq: Every day | ORAL | Status: DC
  Administered 2013-12-12 – 2013-12-13 (×2): 500 mg via ORAL
  Filled 2013-12-11 (×2): qty 1

## 2013-12-11 MED ORDER — LIDOCAINE HCL (CARDIAC) 20 MG/ML IV SOLN
INTRAVENOUS | Status: AC
  Filled 2013-12-11: qty 5

## 2013-12-11 MED ORDER — METHOCARBAMOL 1000 MG/10ML IJ SOLN
500.0000 mg | Freq: Four times a day (QID) | INTRAVENOUS | Status: DC | PRN
  Filled 2013-12-11: qty 5

## 2013-12-11 MED ORDER — METHOCARBAMOL 500 MG PO TABS
500.0000 mg | ORAL_TABLET | Freq: Four times a day (QID) | ORAL | Status: DC | PRN
  Administered 2013-12-12 (×2): 500 mg via ORAL
  Filled 2013-12-11 (×3): qty 1

## 2013-12-11 MED ORDER — METOCLOPRAMIDE HCL 5 MG/ML IJ SOLN: 5.0000 mg | Freq: Three times a day (TID) | INTRAMUSCULAR | Status: DC | PRN

## 2013-12-11 MED ORDER — BUPIVACAINE LIPOSOME 1.3 % IJ SUSP
20.0000 mL | Freq: Once | INTRAMUSCULAR | Status: DC
  Filled 2013-12-11: qty 20

## 2013-12-11 MED ORDER — PROPOFOL 10 MG/ML IV BOLUS
INTRAVENOUS | Status: AC
  Filled 2013-12-11: qty 20

## 2013-12-11 MED ORDER — LORAZEPAM 0.5 MG PO TABS: 0.2500 mg | ORAL_TABLET | Freq: Two times a day (BID) | ORAL | Status: DC | PRN

## 2013-12-11 MED ORDER — CHROMIUM-CINNAMON 50-500 MCG-MG PO CAPS: 1.0000 | ORAL_CAPSULE | Freq: Every day | ORAL | Status: DC

## 2013-12-11 MED ORDER — ONDANSETRON HCL 4 MG/2ML IJ SOLN
INTRAMUSCULAR | Status: DC | PRN
Start: 2013-12-11 — End: 2013-12-11
  Administered 2013-12-11: 4 mg via INTRAVENOUS

## 2013-12-11 MED ORDER — ONDANSETRON HCL 4 MG/2ML IJ SOLN
INTRAMUSCULAR | Status: AC
  Filled 2013-12-11: qty 2

## 2013-12-11 MED ORDER — METOCLOPRAMIDE HCL 5 MG PO TABS
5.0000 mg | ORAL_TABLET | Freq: Three times a day (TID) | ORAL | Status: DC | PRN
  Filled 2013-12-11: qty 2

## 2013-12-11 MED ORDER — PROPOFOL 10 MG/ML IV BOLUS
INTRAVENOUS | Status: DC | PRN
  Administered 2013-12-11: 320 mg via INTRAVENOUS

## 2013-12-11 MED ORDER — SODIUM CHLORIDE 0.9 % IJ SOLN
INTRAMUSCULAR | Status: DC | PRN
  Administered 2013-12-11 (×2): 10 mL

## 2013-12-11 MED ORDER — FENTANYL CITRATE 0.05 MG/ML IJ SOLN
INTRAMUSCULAR | Status: AC
  Filled 2013-12-11: qty 2

## 2013-12-11 MED ORDER — ONDANSETRON HCL 4 MG/2ML IJ SOLN
4.0000 mg | Freq: Four times a day (QID) | INTRAMUSCULAR | Status: DC | PRN
Start: 2013-12-11 — End: 2013-12-13

## 2013-12-11 MED ORDER — NEOSTIGMINE METHYLSULFATE 10 MG/10ML IV SOLN
INTRAVENOUS | Status: DC | PRN
  Administered 2013-12-11: 3 mg via INTRAVENOUS

## 2013-12-11 MED ORDER — GLYCOPYRROLATE 0.2 MG/ML IJ SOLN
INTRAMUSCULAR | Status: AC
  Filled 2013-12-11: qty 3

## 2013-12-11 MED ORDER — METFORMIN HCL ER 500 MG PO TB24
500.0000 mg | ORAL_TABLET | Freq: Every day | ORAL | Status: DC
  Administered 2013-12-11 – 2013-12-12 (×2): 500 mg via ORAL
  Filled 2013-12-11 (×3): qty 1

## 2013-12-11 MED ORDER — MENTHOL 3 MG MT LOZG: 1.0000 | LOZENGE | OROMUCOSAL | Status: DC | PRN

## 2013-12-11 MED ORDER — HYDROMORPHONE HCL PF 1 MG/ML IJ SOLN
INTRAMUSCULAR | Status: AC
  Filled 2013-12-11: qty 1

## 2013-12-11 MED ORDER — DICYCLOMINE HCL 10 MG PO CAPS
10.0000 mg | ORAL_CAPSULE | Freq: Four times a day (QID) | ORAL | Status: DC | PRN
  Filled 2013-12-11: qty 1

## 2013-12-11 MED ORDER — HYDROMORPHONE HCL PF 1 MG/ML IJ SOLN
0.2500 mg | INTRAMUSCULAR | Status: DC | PRN
  Administered 2013-12-11 (×2): 0.5 mg via INTRAVENOUS

## 2013-12-11 MED ORDER — DILTIAZEM HCL ER COATED BEADS 120 MG PO TB24
120.0000 mg | ORAL_TABLET | Freq: Every day | ORAL | Status: DC
  Administered 2013-12-12 – 2013-12-13 (×2): 120 mg via ORAL
  Filled 2013-12-11 (×2): qty 1

## 2013-12-11 MED ORDER — ALBUTEROL SULFATE (2.5 MG/3ML) 0.083% IN NEBU: 2.0000 mL | INHALATION_SOLUTION | Freq: Four times a day (QID) | RESPIRATORY_TRACT | Status: DC | PRN

## 2013-12-11 MED ORDER — CHLORHEXIDINE GLUCONATE 4 % EX LIQD
60.0000 mL | Freq: Once | CUTANEOUS | Status: DC
  Filled 2013-12-11: qty 60

## 2013-12-11 MED ORDER — LEVOTHYROXINE SODIUM 175 MCG PO TABS: 87.5000 ug | ORAL_TABLET | ORAL | Status: DC

## 2013-12-11 MED ORDER — LEVOTHYROXINE SODIUM 175 MCG PO TABS
175.0000 ug | ORAL_TABLET | ORAL | Status: DC
  Administered 2013-12-12 – 2013-12-13 (×2): 175 ug via ORAL
  Filled 2013-12-11 (×3): qty 1

## 2013-12-11 MED ORDER — HYDROCHLOROTHIAZIDE 12.5 MG PO CAPS
12.5000 mg | ORAL_CAPSULE | Freq: Every day | ORAL | Status: DC
  Administered 2013-12-12 – 2013-12-13 (×2): 12.5 mg via ORAL
  Filled 2013-12-11 (×2): qty 1

## 2013-12-11 MED ORDER — LIDOCAINE HCL (CARDIAC) 20 MG/ML IV SOLN
INTRAVENOUS | Status: DC | PRN
  Administered 2013-12-11: 60 mg via INTRAVENOUS

## 2013-12-11 MED ORDER — LACTATED RINGERS IV SOLN
INTRAVENOUS | Status: DC
  Administered 2013-12-11: 08:00:00 via INTRAVENOUS

## 2013-12-11 MED ORDER — PANTOPRAZOLE SODIUM 40 MG PO TBEC
40.0000 mg | DELAYED_RELEASE_TABLET | Freq: Every day | ORAL | Status: DC
  Administered 2013-12-11 – 2013-12-13 (×3): 40 mg via ORAL
  Filled 2013-12-11 (×3): qty 1

## 2013-12-11 MED ORDER — FENTANYL CITRATE 0.05 MG/ML IJ SOLN
INTRAMUSCULAR | Status: DC | PRN
  Administered 2013-12-11: 50 ug via INTRAVENOUS
  Administered 2013-12-11: 100 ug via INTRAVENOUS
  Administered 2013-12-11: 50 ug via INTRAVENOUS

## 2013-12-11 MED ORDER — FENTANYL CITRATE 0.05 MG/ML IJ SOLN
INTRAMUSCULAR | Status: AC
  Filled 2013-12-11: qty 5

## 2013-12-11 MED ORDER — MIDAZOLAM HCL 2 MG/2ML IJ SOLN
INTRAMUSCULAR | Status: AC
Start: 2013-12-11 — End: 2013-12-11
  Filled 2013-12-11: qty 2

## 2013-12-11 MED ORDER — ARTIFICIAL TEARS OP OINT
TOPICAL_OINTMENT | OPHTHALMIC | Status: AC
  Filled 2013-12-11: qty 3.5

## 2013-12-11 MED ORDER — ONDANSETRON HCL 4 MG PO TABS: 4.0000 mg | ORAL_TABLET | Freq: Four times a day (QID) | ORAL | Status: DC | PRN

## 2013-12-11 MED ORDER — FLUTICASONE PROPIONATE 50 MCG/ACT NA SUSP
2.0000 | Freq: Every day | NASAL | Status: DC | PRN
  Filled 2013-12-11: qty 16

## 2013-12-11 MED ORDER — DOCUSATE SODIUM 100 MG PO CAPS
100.0000 mg | ORAL_CAPSULE | Freq: Two times a day (BID) | ORAL | Status: DC
Start: 2013-12-11 — End: 2013-12-13
  Administered 2013-12-11 – 2013-12-13 (×4): 100 mg via ORAL
  Filled 2013-12-11 (×5): qty 1

## 2013-12-11 MED ORDER — CEFAZOLIN SODIUM-DEXTROSE 2-3 GM-% IV SOLR
2.0000 g | Freq: Four times a day (QID) | INTRAVENOUS | Status: AC
  Administered 2013-12-11 (×2): 2 g via INTRAVENOUS
  Filled 2013-12-11 (×2): qty 50

## 2013-12-11 MED ORDER — SODIUM CHLORIDE 0.9 % IR SOLN
Status: DC | PRN
  Administered 2013-12-11 (×2): 1000 mL

## 2013-12-11 MED ORDER — FENTANYL CITRATE 0.05 MG/ML IJ SOLN
100.0000 ug | Freq: Once | INTRAMUSCULAR | Status: AC
  Administered 2013-12-11: 100 ug via INTRAVENOUS

## 2013-12-11 MED ORDER — 0.9 % SODIUM CHLORIDE (POUR BTL) OPTIME
TOPICAL | Status: DC | PRN
  Administered 2013-12-11: 1000 mL

## 2013-12-11 MED ORDER — LORATADINE 10 MG PO TABS
10.0000 mg | ORAL_TABLET | Freq: Every day | ORAL | Status: DC
  Administered 2013-12-12 – 2013-12-13 (×2): 10 mg via ORAL
  Filled 2013-12-11 (×2): qty 1

## 2013-12-11 MED ORDER — VITAMIN D3 25 MCG (1000 UNIT) PO TABS
5000.0000 [IU] | ORAL_TABLET | Freq: Every evening | ORAL | Status: DC
  Administered 2013-12-11 – 2013-12-12 (×2): 5000 [IU] via ORAL
  Filled 2013-12-11 (×3): qty 5

## 2013-12-11 MED ORDER — HYDROMORPHONE HCL PF 1 MG/ML IJ SOLN
0.5000 mg | INTRAMUSCULAR | Status: DC | PRN
  Administered 2013-12-11 – 2013-12-12 (×3): 1 mg via INTRAVENOUS
  Filled 2013-12-11 (×3): qty 1

## 2013-12-11 MED ORDER — BUPIVACAINE LIPOSOME 1.3 % IJ SUSP
INTRAMUSCULAR | Status: DC | PRN
  Administered 2013-12-11: 20 mL

## 2013-12-11 MED ORDER — ATORVASTATIN CALCIUM 10 MG PO TABS
10.0000 mg | ORAL_TABLET | Freq: Every day | ORAL | Status: DC
  Administered 2013-12-11 – 2013-12-12 (×2): 10 mg via ORAL
  Filled 2013-12-11 (×3): qty 1

## 2013-12-11 MED ORDER — DIPHENHYDRAMINE HCL 12.5 MG/5ML PO ELIX: 12.5000 mg | ORAL_SOLUTION | ORAL | Status: DC | PRN

## 2013-12-11 MED ORDER — ACETAMINOPHEN 650 MG RE SUPP: 650.0000 mg | Freq: Four times a day (QID) | RECTAL | Status: DC | PRN

## 2013-12-11 MED ORDER — ASPIRIN EC 325 MG PO TBEC
325.0000 mg | DELAYED_RELEASE_TABLET | Freq: Two times a day (BID) | ORAL | Status: DC
  Administered 2013-12-12 – 2013-12-13 (×3): 325 mg via ORAL
  Filled 2013-12-11 (×5): qty 1

## 2013-12-11 MED ORDER — HYDROCODONE-ACETAMINOPHEN 7.5-325 MG PO TABS
1.0000 | ORAL_TABLET | ORAL | Status: DC | PRN
Start: 2013-12-11 — End: 2013-12-12
  Administered 2013-12-11 – 2013-12-12 (×3): 2 via ORAL
  Filled 2013-12-11 (×4): qty 2

## 2013-12-11 MED ORDER — LACTATED RINGERS IV SOLN
INTRAVENOUS | Status: DC | PRN
  Administered 2013-12-11 (×2): via INTRAVENOUS

## 2013-12-11 MED ORDER — SUCCINYLCHOLINE CHLORIDE 20 MG/ML IJ SOLN
INTRAMUSCULAR | Status: DC | PRN
  Administered 2013-12-11: 120 mg via INTRAVENOUS

## 2013-12-11 MED ORDER — GLYCOPYRROLATE 0.2 MG/ML IJ SOLN
INTRAMUSCULAR | Status: DC | PRN
  Administered 2013-12-11: 0.4 mg via INTRAVENOUS

## 2013-12-11 MED ORDER — ALUM & MAG HYDROXIDE-SIMETH 200-200-20 MG/5ML PO SUSP: 30.0000 mL | ORAL | Status: DC | PRN

## 2013-12-11 MED ORDER — LACTATED RINGERS IV SOLN
INTRAVENOUS | Status: DC
Start: 2013-12-11 — End: 2013-12-12
  Administered 2013-12-12: via INTRAVENOUS

## 2013-12-11 MED ORDER — ROCURONIUM BROMIDE 100 MG/10ML IV SOLN
INTRAVENOUS | Status: DC | PRN
  Administered 2013-12-11: 30 mg via INTRAVENOUS

## 2013-12-11 MED ORDER — LISINOPRIL 2.5 MG PO TABS
2.5000 mg | ORAL_TABLET | Freq: Every day | ORAL | Status: DC
  Administered 2013-12-12 – 2013-12-13 (×2): 2.5 mg via ORAL
  Filled 2013-12-11 (×2): qty 1

## 2013-12-11 MED ORDER — VITAMIN D-3 125 MCG (5000 UT) PO TABS: 5000.0000 [IU] | ORAL_TABLET | Freq: Every evening | ORAL | Status: DC

## 2013-12-11 MED ORDER — NEOSTIGMINE METHYLSULFATE 10 MG/10ML IV SOLN
INTRAVENOUS | Status: AC
  Filled 2013-12-11: qty 1

## 2013-12-11 MED ORDER — BISACODYL 5 MG PO TBEC: 5.0000 mg | DELAYED_RELEASE_TABLET | Freq: Every day | ORAL | Status: DC | PRN

## 2013-12-11 MED ORDER — PHENOL 1.4 % MT LIQD: 1.0000 | OROMUCOSAL | Status: DC | PRN

## 2013-12-11 MED ORDER — ONDANSETRON HCL 4 MG/2ML IJ SOLN
4.0000 mg | Freq: Once | INTRAMUSCULAR | Status: DC | PRN
Start: 2013-12-11 — End: 2013-12-11

## 2013-12-11 SURGICAL SUPPLY — 70 items
BANDAGE ELASTIC 4 VELCRO ST LF (GAUZE/BANDAGES/DRESSINGS) IMPLANT
BANDAGE ESMARK 6X9 LF (GAUZE/BANDAGES/DRESSINGS) ×1 IMPLANT
BENZOIN TINCTURE PRP APPL 2/3 (GAUZE/BANDAGES/DRESSINGS) ×3 IMPLANT
BLADE SAG 18X100X1.27 (BLADE) IMPLANT
BLADE SAGITTAL 13X1.27X60 (BLADE) IMPLANT
BLADE SAGITTAL 13X1.27X60MM (BLADE)
BLADE SAGITTAL 25.0X1.19X90 (BLADE) ×2 IMPLANT
BLADE SAGITTAL 25.0X1.19X90MM (BLADE) ×1
BLADE SURG ROTATE 9660 (MISCELLANEOUS) IMPLANT
BNDG ELASTIC 6X10 VLCR STRL LF (GAUZE/BANDAGES/DRESSINGS) ×3 IMPLANT
BNDG ESMARK 6X9 LF (GAUZE/BANDAGES/DRESSINGS) ×3
BNDG GAUZE ELAST 4 BULKY (GAUZE/BANDAGES/DRESSINGS) ×3 IMPLANT
BOWL SMART MIX CTS (DISPOSABLE) ×3 IMPLANT
CAP UPCHARGE REVISION TRAY ×3 IMPLANT
CAPT RP KNEE ×3 IMPLANT
CEMENT HV SMART SET (Cement) ×6 IMPLANT
CLOSURE STERI-STRIP 1/2X4 (GAUZE/BANDAGES/DRESSINGS) ×1
CLSR STERI-STRIP ANTIMIC 1/2X4 (GAUZE/BANDAGES/DRESSINGS) ×2 IMPLANT
COVER SURGICAL LIGHT HANDLE (MISCELLANEOUS) ×3 IMPLANT
CUFF TOURNIQUET SINGLE 34IN LL (TOURNIQUET CUFF) ×3 IMPLANT
CUFF TOURNIQUET SINGLE 44IN (TOURNIQUET CUFF) IMPLANT
DRAPE EXTREMITY T 121X128X90 (DRAPE) ×3 IMPLANT
DRAPE PROXIMA HALF (DRAPES) ×3 IMPLANT
DRAPE U-SHAPE 47X51 STRL (DRAPES) ×3 IMPLANT
DRSG ADAPTIC 3X8 NADH LF (GAUZE/BANDAGES/DRESSINGS) IMPLANT
DRSG PAD ABDOMINAL 8X10 ST (GAUZE/BANDAGES/DRESSINGS) ×3 IMPLANT
DURAPREP 26ML APPLICATOR (WOUND CARE) ×3 IMPLANT
ELECT REM PT RETURN 9FT ADLT (ELECTROSURGICAL) ×3
ELECTRODE REM PT RTRN 9FT ADLT (ELECTROSURGICAL) ×1 IMPLANT
GAUZE SPONGE 4X4 12PLY STRL (GAUZE/BANDAGES/DRESSINGS) ×3 IMPLANT
GLOVE BIO SURGEON STRL SZ8 (GLOVE) ×6 IMPLANT
GLOVE BIOGEL PI IND STRL 7.0 (GLOVE) ×2 IMPLANT
GLOVE BIOGEL PI IND STRL 8 (GLOVE) ×2 IMPLANT
GLOVE BIOGEL PI INDICATOR 7.0 (GLOVE) ×4
GLOVE BIOGEL PI INDICATOR 8 (GLOVE) ×4
GLOVE SURG SS PI 7.0 STRL IVOR (GLOVE) ×6 IMPLANT
GOWN STRL REUS W/ TWL LRG LVL3 (GOWN DISPOSABLE) ×2 IMPLANT
GOWN STRL REUS W/ TWL XL LVL3 (GOWN DISPOSABLE) ×1 IMPLANT
GOWN STRL REUS W/TWL 2XL LVL3 (GOWN DISPOSABLE) ×3 IMPLANT
GOWN STRL REUS W/TWL LRG LVL3 (GOWN DISPOSABLE) ×4
GOWN STRL REUS W/TWL XL LVL3 (GOWN DISPOSABLE) ×2
HANDPIECE INTERPULSE COAX TIP (DISPOSABLE) ×2
HOOD PEEL AWAY FACE SHEILD DIS (HOOD) ×9 IMPLANT
IMMOBILIZER KNEE 20 (SOFTGOODS) IMPLANT
IMMOBILIZER KNEE 22 (SOFTGOODS) ×3 IMPLANT
IMMOBILIZER KNEE 22 UNIV (SOFTGOODS) ×3 IMPLANT
IMMOBILIZER KNEE 24 THIGH 36 (MISCELLANEOUS) IMPLANT
IMMOBILIZER KNEE 24 UNIV (MISCELLANEOUS)
KIT BASIN OR (CUSTOM PROCEDURE TRAY) ×3 IMPLANT
KIT ROOM TURNOVER OR (KITS) ×3 IMPLANT
MANIFOLD NEPTUNE II (INSTRUMENTS) ×3 IMPLANT
NEEDLE HYPO 21X1 ECLIPSE (NEEDLE) IMPLANT
NS IRRIG 1000ML POUR BTL (IV SOLUTION) ×3 IMPLANT
PACK TOTAL JOINT (CUSTOM PROCEDURE TRAY) ×3 IMPLANT
PAD ARMBOARD 7.5X6 YLW CONV (MISCELLANEOUS) ×6 IMPLANT
SET HNDPC FAN SPRY TIP SCT (DISPOSABLE) ×1 IMPLANT
SPONGE GAUZE 4X4 12PLY STER LF (GAUZE/BANDAGES/DRESSINGS) ×3 IMPLANT
STAPLER VISISTAT 35W (STAPLE) IMPLANT
SUCTION FRAZIER TIP 10 FR DISP (SUCTIONS) IMPLANT
SUT MNCRL AB 3-0 PS2 18 (SUTURE) IMPLANT
SUT VIC AB 0 CT1 27 (SUTURE) ×4
SUT VIC AB 0 CT1 27XBRD ANBCTR (SUTURE) ×2 IMPLANT
SUT VIC AB 2-0 CT1 27 (SUTURE) ×4
SUT VIC AB 2-0 CT1 TAPERPNT 27 (SUTURE) ×2 IMPLANT
SUT VLOC 180 0 24IN GS25 (SUTURE) ×3 IMPLANT
SYR 50ML LL SCALE MARK (SYRINGE) ×3 IMPLANT
TOWEL OR 17X24 6PK STRL BLUE (TOWEL DISPOSABLE) ×3 IMPLANT
TOWEL OR 17X26 10 PK STRL BLUE (TOWEL DISPOSABLE) ×3 IMPLANT
TRAY FOLEY CATH 14FR (SET/KITS/TRAYS/PACK) IMPLANT
WATER STERILE IRR 1000ML POUR (IV SOLUTION) IMPLANT

## 2013-12-11 NOTE — Interval H&P Note (Signed)
History and Physical Interval Note:  12/11/2013 9:18 AM  Melanie Hood  has presented today for surgery, with the diagnosis of [name]  The various methods of treatment have been discussed with the patient and family. After consideration of risks, benefits and other options for treatment, the patient has consented to  Procedure(s): TOTAL KNEE ARTHROPLASTY (Right) as a surgical intervention .  The patient's history has been reviewed, patient examined, no change in status, stable for surgery.  I have reviewed the patient's chart and labs.  Questions were answered to the patient's satisfaction.     Yulian Gosney G

## 2013-12-11 NOTE — Op Note (Signed)
PREOP DIAGNOSIS: DJD RIGHT KNEE POSTOP DIAGNOSIS: same PROCEDURE: RIGHT TKR ANESTHESIA: General and block ATTENDING SURGEON: Nazirah Tri G ASSISTANTLoni Dolly PA  INDICATIONS FOR PROCEDURE: Melanie Hood is a 58 y.o. female who has struggled for a long time with pain due to degenerative arthritis of the right knee.  The patient has failed many conservative non-operative measures and at this point has pain which limits the ability to sleep and walk.  The patient is offered total knee replacement.  Informed operative consent was obtained after discussion of possible risks of anesthesia, infection, neurovascular injury, DVT, and death.  The importance of the post-operative rehabilitation protocol to optimize result was stressed extensively with the patient.  SUMMARY OF FINDINGS AND PROCEDURE:  Melanie Hood was taken to the operative suite where under the above anesthesia a right knee replacement was performed.  There were advanced degenerative changes and the bone quality was good.  We used the DePuy LCS system and placed size standard plus femur, 4 MBT revision tibia, 38 mm all polyethylene patella, and a size 12.5 mm spacer.  Loni Dolly PA-C assisted throughout and was invaluable to the completion of the case in that he helped retract and maintain exposure while I placed components.  He also helped close thereby minimizing OR time.  The patient was admitted for appropriate post-op care to include perioperative antibiotics and mechanical and pharmacologic measures for DVT prophylaxis.  DESCRIPTION OF PROCEDURE:  Melanie Hood was taken to the operative suite where the above anesthesia was applied.  The patient was positioned supine and prepped and draped in normal sterile fashion.  An appropriate time out was performed.  After the administration of kefzol pre-op antibiotic the leg was elevated and exsanguinated and a tourniquet inflated. A standard longitudinal incision was made on the anterior knee.   Dissection was carried down to the extensor mechanism.  All appropriate anti-infective measures were used including the pre-operative antibiotic, betadine impregnated drape, and closed hooded exhaust systems for each member of the surgical team.  A medial parapatellar incision was made in the extensor mechanism and the knee cap flipped and the knee flexed.  Some residual meniscal tissues were removed along with any remaining ACL/PCL tissue.  A guide was placed on the tibia and a flat cut was made on it's superior surface.  An intramedullary guide was placed in the femur and was utilized to make anterior and posterior cuts creating an appropriate flexion gap.  A second intramedullary guide was placed in the femur to make a distal cut properly balancing the knee with an extension gap equal to the flexion gap.  The three bones sized to the above mentioned sizes and the appropriate guides were placed and utilized.  A trial reduction was done and the knee easily came to full extension and the patella tracked well on flexion.  The trial components were removed and all bones were cleaned with pulsatile lavage and then dried thoroughly.  Cement was mixed and was pressurized onto the bones followed by placement of the aforementioned components.  Excess cement was trimmed and pressure was held on the components until the cement had hardened.  The tourniquet was deflated and a small amount of bleeding was controlled with cautery and pressure.  The knee was irrigated thoroughly.  The extensor mechanism was re-approximated with V-loc suture in running fashion.  The knee was flexed and the repair was solid.  The subcutaneous tissues were re-approximated with #0 and #2-0 vicryl and the skin  closed with a subcuticular stitch and steristrips.  A sterile dressing was applied.  Intraoperative fluids, EBL, and tourniquet time can be obtained from anesthesia records.  DISPOSITION:  The patient was taken to recovery room in stable  condition and admitted for appropriate post-op care to include peri-operative antibiotic and DVT prophylaxis with mechanical and pharmacologic measures.  Montavis Schubring G 12/11/2013, 12:08 PM

## 2013-12-11 NOTE — Progress Notes (Signed)
Placed pt. On cpap. Pt. Tolerating well at this time. 

## 2013-12-11 NOTE — Progress Notes (Signed)
Orthopedic Tech Progress Note Patient Details:  Melanie Hood July 01, 1955 [id number]  CPM Right Knee CPM Right Knee: On Right Knee Flexion (Degrees): 60 Right Knee Extension (Degrees): 0 Additional Comments: Trapeze bar   Cammer, Theodoro Parma 12/11/2013, 2:27 PM

## 2013-12-11 NOTE — Anesthesia Procedure Notes (Addendum)
Procedure Name: Intubation Date/Time: 12/11/2013 10:29 AM Performed by: Susa Loffler Pre-anesthesia Checklist: Patient identified, Timeout performed, Emergency Drugs available, Suction available and Patient being monitored Patient Re-evaluated:Patient Re-evaluated prior to inductionOxygen Delivery Method: Circle system utilized Preoxygenation: Pre-oxygenation with 100% oxygen Intubation Type: IV induction Ventilation: Mask ventilation without difficulty Laryngoscope Size: Mac and 4 Grade View: Grade IV Tube type: Oral Tube size: 7.0 mm Number of attempts: 1 Airway Equipment and Method: Bougie stylet and Stylet Placement Confirmation: ETT inserted through vocal cords under direct vision,  positive ETCO2 and breath sounds checked- equal and bilateral Secured at: 23 cm Tube secured with: Tape Dental Injury: Teeth and Oropharynx as per pre-operative assessment  Comments: DLx1, inadequate view with Mac 4 blade, bougie stylet used instead without complication, atraumatic. VSS.   Anesthesia Regional Block:  Femoral nerve block  Pre-Anesthetic Checklist: ,, timeout performed, Correct Patient, Correct Site, Correct Laterality, Correct Procedure, Correct Position, site marked, Risks and benefits discussed,  Surgical consent,  Pre-op evaluation,  At surgeon's request and post-op pain management  Laterality: Right  Prep: Maximum Sterile Barrier Precautions used, chloraprep and alcohol swabs       Needles:  Injection technique: Single-shot  Needle Type: Stimulator Needle - 80        Needle insertion depth: 7 cm   Additional Needles:  Procedures: nerve stimulator Femoral nerve block  Nerve Stimulator or Paresthesia:  Response: 0.5 mA, 0.1 ms, 7 cm  Additional Responses:   Narrative:  Start time: 12/11/2013 10:15 AM End time: 12/11/2013 10:20 AM Injection made incrementally with aspirations every 5 mL.  Performed by: Personally  Anesthesiologist: Sharolyn Douglas  MD  Additional Notes: Pt accepts procedure w/ risks. 20cc 0.5% Marcaine w/ epi w/o difficulty or discomfort. GES

## 2013-12-11 NOTE — Progress Notes (Signed)
Orthopedic Tech Progress Note Patient Details:  Melanie Hood 1955/06/05 [id number] On cpm at 8:10 RLE 0-60 Patient ID: [name], female   DOB: April 08, 1956, 58 y.o.   MRN: [id number]   [name] 12/11/2013, 8:10 PM

## 2013-12-11 NOTE — Progress Notes (Signed)
Lunch relief by S. Gregson RN 

## 2013-12-11 NOTE — Progress Notes (Signed)
PHARMACIST - PHYSICIAN ORDER COMMUNICATION  CONCERNING: P&T Medication Policy on Herbal Medications  DESCRIPTION:  This patient's order for: Chromium-Cinnamon  has been noted.  This product(s) is classified as an "herbal" or natural product. Due to a lack of definitive safety studies or FDA approval, nonstandard manufacturing practices, plus the potential risk of unknown drug-drug interactions while on inpatient medications, the Pharmacy and Therapeutics Committee does not permit the use of "herbal" or natural products of this type within [facility].   ACTION TAKEN: The pharmacy department is unable to verify this order at this time and your patient has been informed of this safety policy. Please reevaluate patient's clinical condition at discharge and address if the herbal or natural product(s) should be resumed at that time.  Kelvin Cellar, RPh Pager: [phone] 12/11/2013 4:00 PM

## 2013-12-11 NOTE — Transfer of Care (Signed)
Immediate Anesthesia Transfer of Care Note  Patient: Melanie Hood  Procedure(s) Performed: Procedure(s): TOTAL KNEE ARTHROPLASTY (Right)  Patient Location: PACU  Anesthesia Type:General  Level of Consciousness: awake, alert  and oriented  Airway & Oxygen Therapy: Patient Spontanous Breathing and Patient connected to nasal cannula oxygen  Post-op Assessment: Report given to PACU RN and Post -op Vital signs reviewed and stable  Post vital signs: Reviewed and stable  Complications: No apparent anesthesia complications

## 2013-12-11 NOTE — Plan of Care (Signed)
Problem: Consults Goal: Diagnosis- Total Joint Replacement Primary Total Knee Right     

## 2013-12-11 NOTE — Evaluation (Signed)
Physical Therapy Evaluation Patient Details Name: Melanie Hood MRN: [id number] DOB: 1956-01-29 Today's Date: 12/11/2013   History of Present Illness  Pt is a 58 y/o female admitted s/p elective R TKA.   Clinical Impression  Pt is s/p TKA resulting in the deficits listed below (see PT Problem List). At the time of PT eval pt was able to transfer bed>chair with min assist - mod assist to stand from bed in lowest position. Did note buckling of knee during any weightbearing, and would recommend use of knee immobilizer (present in room) during first ambulation. Pt will benefit from skilled PT to increase their independence and safety with mobility to allow discharge to the venue listed below.     Follow Up Recommendations Home health PT;Supervision/Assistance - 24 hour    Equipment Recommendations  Rolling walker with 5" wheels;3in1 (PT)    Recommendations for Other Services       Precautions / Restrictions Precautions Precautions: Fall;Knee Precaution Comments: Discussed towel roll under heel and NO pillow under knee.  Restrictions Weight Bearing Restrictions: Yes RLE Weight Bearing: Weight bearing as tolerated      Mobility  Bed Mobility Overal bed mobility: Needs Assistance Bed Mobility: Supine to Sit     Supine to sit: Min assist     General bed mobility comments: Assist for movement and support of RLE. Pt moving well and was able to elevate trunk to full sitting position without assist.   Transfers Overall transfer level: Needs assistance Equipment used: Rolling walker (2 wheeled) Transfers: Sit to/from Stand Sit to Stand: Mod assist         General transfer comment: 3 attempts for successful sit>stand. Increased VC's required for hand placement on seated surface for safety, and rocking was utilized to build momentum for transfer. Once standing, pt cued to wait and gain balance prior to initiating ambulation.   Ambulation/Gait Ambulation/Gait assistance: Min  assist Ambulation Distance (Feet): 3 Feet Assistive device: Rolling walker (2 wheeled) Gait Pattern/deviations: Step-to pattern;Decreased stride length;Trunk flexed Gait velocity: Decreased Gait velocity interpretation: Below normal speed for age/gender General Gait Details: Pt able to take pivotal steps around to the recliner which was about 3 feet away from the bed. VC's for improved posture and sequencing with the RW.   Stairs            Wheelchair Mobility    Modified Rankin (Stroke Patients Only)       Balance Overall balance assessment: Needs assistance Sitting-balance support: Feet supported;No upper extremity supported Sitting balance-Leahy Scale: Fair     Standing balance support: Bilateral upper extremity supported;During functional activity Standing balance-Leahy Scale: Poor                               Pertinent Vitals/Pain Pain Assessment: 0-10 Pain Score: 4  Pain Location: R knee Pain Intervention(s): Monitored during session;Premedicated before session    Home Living Family/patient expects to be discharged to:: Private residence Living Arrangements: Children;Other relatives Available Help at Discharge: Family;Available 24 hours/day Type of Home: House Home Access: Stairs to enter   [employer] of Steps: 1 Home Layout: Two level;Able to live on main level with bedroom/bathroom Home Equipment: Kasandra Knudsen - single point      Prior Function Level of Independence: Independent               Hand Dominance   Dominant Hand: Right    Extremity/Trunk Assessment   Upper Extremity  Assessment: Defer to OT evaluation           Lower Extremity Assessment: RLE deficits/detail RLE Deficits / Details: Decreased strength and AROM consistent with TKA    Cervical / Trunk Assessment: Normal  Communication   Communication: No difficulties  Cognition Arousal/Alertness: Awake/alert Behavior During Therapy: WFL for tasks  assessed/performed Overall Cognitive Status: Within Functional Limits for tasks assessed                      General Comments      Exercises Total Joint Exercises Ankle Circles/Pumps: 10 reps Quad Sets: 10 reps Gluteal Sets: 10 reps      Assessment/Plan    PT Assessment Patient needs continued PT services  PT Diagnosis Difficulty walking;Acute pain   PT Problem List Decreased strength;Decreased range of motion;Decreased activity tolerance;Decreased balance;Decreased mobility;Decreased knowledge of use of DME;Decreased safety awareness;Decreased knowledge of precautions;Pain  PT Treatment Interventions DME instruction;Gait training;Stair training;Functional mobility training;Therapeutic activities;Therapeutic exercise;Neuromuscular re-education;Patient/family education   PT Goals (Current goals can be found in the Care Plan section) Acute Rehab PT Goals Patient Stated Goal: To return home independently PT Goal Formulation: With patient Time For Goal Achievement: 12/18/13 Potential to Achieve Goals: Good    Frequency 7X/week   Barriers to discharge        Co-evaluation               End of Session Equipment Utilized During Treatment: Gait belt;Oxygen Activity Tolerance: Patient tolerated treatment well Patient left: in chair;with call bell/phone within reach Nurse Communication: Mobility status         Time: 1634-1700 PT Time Calculation (min): 26 min   Charges:   PT Evaluation $Initial PT Evaluation Tier I: 1 Procedure PT Treatments $Gait Training: 8-22 mins   PT G CodesJolyn Lent 12/11/2013, 5:10 PM  Jolyn Lent, PT, DPT Acute Rehabilitation Services Pager: [phone]

## 2013-12-11 NOTE — Anesthesia Postprocedure Evaluation (Signed)
  Anesthesia Post-op Note  Patient: Melanie Hood  Procedure(s) Performed: Procedure(s): TOTAL KNEE ARTHROPLASTY (Right)  Patient Location: PACU  Anesthesia Type:General  Level of Consciousness: awake, alert , oriented and patient cooperative  Airway and Oxygen Therapy: Patient Spontanous Breathing  Post-op Pain: none  Post-op Assessment: Post-op Vital signs reviewed, Patient's Cardiovascular Status Stable, Respiratory Function Stable, Patent Airway, No signs of Nausea or vomiting and Pain level controlled  Post-op Vital Signs: stable  Last Vitals:  Filed Vitals:   12/11/13 1313  BP:   Pulse: 82  Temp:   Resp: 16    Complications: No apparent anesthesia complications

## 2013-12-11 NOTE — Anesthesia Preprocedure Evaluation (Signed)
Anesthesia Evaluation  Patient identified by MRN, date of birth, ID band Patient awake    Reviewed: Allergy & Precautions, H&P , NPO status , Patient's Chart, lab work & pertinent test results  Airway       Dental   Pulmonary sleep apnea , former smoker,          Cardiovascular hypertension,     Neuro/Psych    GI/Hepatic GERD-  ,  Endo/Other  diabetes, Type 2, Oral Hypoglycemic AgentsHypothyroidism Morbid obesity  Renal/GU      Musculoskeletal  (+) Arthritis -,   Abdominal   Peds  Hematology   Anesthesia Other Findings   Reproductive/Obstetrics                           Anesthesia Physical Anesthesia Plan  ASA: III  Anesthesia Plan: General   Post-op Pain Management:    Induction: Intravenous  Airway Management Planned: Oral ETT  Additional Equipment:   Intra-op Plan:   Post-operative Plan: Extubation in OR  Informed Consent: I have reviewed the patients History and Physical, chart, labs and discussed the procedure including the risks, benefits and alternatives for the proposed anesthesia with the patient or authorized representative who has indicated his/her understanding and acceptance.     Plan Discussed with: CRNA, Surgeon and Anesthesiologist  Anesthesia Plan Comments:         Anesthesia Quick Evaluation

## 2013-12-12 LAB — GLUCOSE, CAPILLARY
GLUCOSE-CAPILLARY: 132 mg/dL — AB (ref 70–99)
Glucose-Capillary: 140 mg/dL — ABNORMAL HIGH (ref 70–99)
Glucose-Capillary: 169 mg/dL — ABNORMAL HIGH (ref 70–99)
Glucose-Capillary: 124 mg/dL — ABNORMAL HIGH (ref 70–99)

## 2013-12-12 LAB — BASIC METABOLIC PANEL
Anion gap: 11 (ref 5–15)
BUN: 11 mg/dL (ref 6–23)
CALCIUM: 8.4 mg/dL (ref 8.4–10.5)
CO2: 25 mEq/L (ref 19–32)
Chloride: 102 mEq/L (ref 96–112)
Creatinine, Ser: 0.85 mg/dL (ref 0.50–1.10)
GFR calc Af Amer: 85 mL/min — ABNORMAL LOW (ref >90)
GFR calc non Af Amer: 74 mL/min — ABNORMAL LOW (ref >90)
Glucose, Bld: 142 mg/dL — ABNORMAL HIGH (ref 70–99)
Potassium: 3.6 mEq/L — ABNORMAL LOW (ref 3.7–5.3)
Sodium: 138 mEq/L (ref 137–147)

## 2013-12-12 LAB — CBC
HEMATOCRIT: 32.4 % — AB (ref 36.0–46.0)
Hemoglobin: 10.7 g/dL — ABNORMAL LOW (ref 12.0–15.0)
MCH: 28.9 pg (ref 26.0–34.0)
MCHC: 33 g/dL (ref 30.0–36.0)
MCV: 87.6 fL (ref 78.0–100.0)
Platelets: 229 10*3/uL (ref 150–400)
RBC: 3.7 MIL/uL — ABNORMAL LOW (ref 3.87–5.11)
RDW: 14.3 % (ref 11.5–15.5)
WBC: 10.8 10*3/uL — ABNORMAL HIGH (ref 4.0–10.5)

## 2013-12-12 MED ORDER — METHOCARBAMOL 750 MG PO TABS
750.0000 mg | ORAL_TABLET | Freq: Four times a day (QID) | ORAL | Status: DC | PRN
  Administered 2013-12-12 – 2013-12-13 (×6): 750 mg via ORAL
  Filled 2013-12-12 (×5): qty 1

## 2013-12-12 MED ORDER — HYDROCODONE-ACETAMINOPHEN 10-325 MG PO TABS
1.0000 | ORAL_TABLET | ORAL | Status: DC | PRN
  Administered 2013-12-12 – 2013-12-13 (×4): 2 via ORAL
  Administered 2013-12-13: 1 via ORAL
  Filled 2013-12-12 (×5): qty 2

## 2013-12-12 NOTE — Progress Notes (Signed)
OT Cancellation Note  Patient Details Name: MAHATI VAJDA MRN: [id number] DOB: 01-11-1956   Cancelled Treatment:    Reason Eval/Treat Not Completed: Fatigue/lethargy limiting ability to participate. Pt unable to keep eyes open during eval/education and requested that OT come back later to complete. Will follow up this afternoon as available.   Jaala, Bohle [phone] 12/12/2013, 11:49 AM

## 2013-12-12 NOTE — Progress Notes (Signed)
Occupational Therapy Evaluation Patient Details Name: Melanie Hood MRN: [id number] DOB: 1955-10-29 Today's Date: 12/12/2013    History of Present Illness Pt is a 58 y/o female admitted s/p elective R TKA.    Clinical Impression   PTA pt lived at home and was independent with use of SPC as needed for ADLs and functional mobility. Pt declined OOB activities after just working with PT but requested to practiced donning/doffing KI. Education and training completed with pt for compensatory techniques for LB ADLs. Discussed shower transfer technique with pt and provided handout. Pt would benefit from skilled OT to progress to Mod Independent level as she will be on her own at times.     Follow Up Recommendations  No OT follow up;Supervision/Assistance - 24 hour    Equipment Recommendations  3 in 1 bedside comode    Recommendations for Other Services       Precautions / Restrictions Precautions Precautions: Fall;Knee Required Braces or Orthoses: Knee Immobilizer - Right Restrictions Weight Bearing Restrictions: No RLE Weight Bearing: Weight bearing as tolerated      Mobility Bed Mobility               General bed mobility comments: Patient in chair before and after session  Transfers         General transfer comment: Pt declined OOB activities.         ADL Overall ADL's : Needs assistance/impaired Eating/Feeding: Independent;Sitting   Grooming: Set up;Sitting   Upper Body Bathing: Set up;Sitting       Upper Body Dressing : Set up;Sitting                     General ADL Comments: Pt declined OOB activities, however wanted to practice donning/doffing KI Independently as her daughter sleeps in the morning following her night shift. Educated pt on use of leg lifter (tie,towel,sheet,etc) to assist with bed mobility and donning/doffing KI. Pt able to use gait belt to lift leg and don KI with Min (A) and VC's for sequencing from OT. Pt practiced x2 before  feeling comfortable with task.      Vision  No apparent visual deficits.                    Perception Perception Perception Tested?: No   Praxis Praxis Praxis tested?: Within functional limits    Pertinent Vitals/Pain Pt denies pain; reports "it's just starting to come back"     Hand Dominance Right   Extremity/Trunk Assessment Upper Extremity Assessment Upper Extremity Assessment: Overall WFL for tasks assessed   Lower Extremity Assessment Lower Extremity Assessment: Defer to PT evaluation   Cervical / Trunk Assessment Cervical / Trunk Assessment: Normal   Communication Communication Communication: No difficulties   Cognition Arousal/Alertness: Awake/alert Behavior During Therapy: WFL for tasks assessed/performed Overall Cognitive Status: Within Functional Limits for tasks assessed                                Home Living Family/patient expects to be discharged to:: Private residence Living Arrangements: Children;Other relatives Available Help at Discharge: Family;Available 24 hours/day Type of Home: House Home Access: Stairs to enter [employer] of Steps: 1   Home Layout: Two level;Able to live on main level with bedroom/bathroom     Bathroom Shower/Tub: Occupational psychologist: Standard     Home Equipment: Cane - single point  Prior Functioning/Environment Level of Independence: Independent with assistive device(s)        Comments: pt reports use of SPC toward the end of the day and night time to improve balance.    OT Diagnosis: Generalized weakness;Acute pain   OT Problem List: Decreased strength;Decreased range of motion;Decreased knowledge of use of DME or AE;Pain   OT Treatment/Interventions: Self-care/ADL training;Therapeutic exercise;Energy conservation;DME and/or AE instruction;Therapeutic activities;Patient/family education;Balance training    OT Goals(Current goals can be found in  the care plan section) Acute Rehab OT Goals Patient Stated Goal: To return home independently OT Goal Formulation: With patient Time For Goal Achievement: 12/19/13 Potential to Achieve Goals: Good ADL Goals Pt Will Perform Lower Body Bathing: with modified independence;with adaptive equipment;sit to/from stand Pt Will Perform Lower Body Dressing: with modified independence;with adaptive equipment;sit to/from stand Pt Will Transfer to Toilet: with modified independence;ambulating;bedside commode Pt Will Perform Toileting - Clothing Manipulation and hygiene: with modified independence;sit to/from stand Pt Will Perform Tub/Shower Transfer: Shower transfer;with modified independence;ambulating;3 in 1;rolling walker  OT Frequency: Min 2X/week    End of Session Equipment Utilized During Treatment: Gait belt CPM Right Knee CPM Right Knee: Off  Activity Tolerance: Patient tolerated treatment well Patient left: in chair;with call bell/phone within reach   Time: 1457-1513 OT Time Calculation (min): 16 min Charges:  OT General Charges $OT Visit: 1 Procedure OT Evaluation $Initial OT Evaluation Tier I: 1 Procedure OT Treatments $Self Care/Home Management : 8-22 mins  Melanie Hood, Melanie Hood [phone] 12/12/2013, 3:40 PM

## 2013-12-12 NOTE — Progress Notes (Signed)
Physical Therapy Treatment Patient Details Name: Melanie Hood MRN: [id number] DOB: 28-Apr-1955 Today's Date: 12/12/2013    History of Present Illness Pt is a 58 y/o female admitted s/p elective R TKA.     PT Comments    Patient did progress well today however pain did limit hall ambulation. Will attempt later today as patient can tolerate. Patient planning to DC home tomorrow  Follow Up Recommendations  Home health PT;Supervision/Assistance - 24 hour     Equipment Recommendations  Rolling walker with 5" wheels;3in1 (PT)    Recommendations for Other Services       Precautions / Restrictions Precautions Precautions: Fall;Knee Restrictions RLE Weight Bearing: Weight bearing as tolerated    Mobility  Bed Mobility               General bed mobility comments: Patient in chair before and after session  Transfers Overall transfer level: Needs assistance Equipment used: Rolling walker (2 wheeled) Transfers: Sit to/from Stand Sit to Stand: Min guard         General transfer comment: Cues for safe hand placement  Ambulation/Gait Ambulation/Gait assistance: Min guard Ambulation Distance (Feet): 25 Feet Assistive device: Rolling walker (2 wheeled) Gait Pattern/deviations: Step-to pattern;Decreased step length - left;Decreased stance time - right Gait velocity: Decreased   General Gait Details: Patient able to walk to restroom and back with cues for RW management and gait sequence. Limited by pain.    Stairs            Wheelchair Mobility    Modified Rankin (Stroke Patients Only)       Balance                                    Cognition Arousal/Alertness: Awake/alert Behavior During Therapy: WFL for tasks assessed/performed Overall Cognitive Status: Within Functional Limits for tasks assessed                      Exercises Total Joint Exercises Quad Sets: AROM;Right;10 reps Heel Slides: AAROM;Right;10 reps Hip  ABduction/ADduction: AAROM;Right;10 reps Straight Leg Raises: AAROM;Right;10 reps    General Comments        Pertinent Vitals/Pain Pain Score: 8  Pain Location: R KNEe Pain Intervention(s): Monitored during session    Home Living                      Prior Function            PT Goals (current goals can now be found in the care plan section) Progress towards PT goals: Progressing toward goals    Frequency  7X/week    PT Plan Current plan remains appropriate    Co-evaluation             End of Session Equipment Utilized During Treatment: Gait belt Activity Tolerance: Patient tolerated treatment well;Patient limited by pain Patient left: in chair;with call bell/phone within reach     Time: 1021-1046 PT Time Calculation (min): 25 min  Charges:  $Gait Training: 8-22 mins $Therapeutic Exercise: 8-22 mins                    G Codes:      Jacqualyn Posey 12/12/2013, 2:53 PM 12/12/2013 Jacqualyn Posey PTA [phone] pager [phone] office

## 2013-12-12 NOTE — Progress Notes (Signed)
Subjective: 1 Day Post-Op Procedure(s) (LRB): TOTAL KNEE ARTHROPLASTY (Right)  Activity level:  wbat Diet tolerance:  Eating well Voiding:  ok Patient reports pain as moderate.    Objective: Vital signs in last 24 hours: Temp:  [97 F (36.1 C)-99.7 F (37.6 C)] 99.4 F (37.4 C) (09/02 0542) Pulse Rate:  [64-91] 91 (09/02 0542) Resp:  [14-32] 16 (09/02 0542) BP: (109-154)/(59-88) 126/65 mmHg (09/02 0542) SpO2:  [99 %-100 %] 100 % (09/02 0542) FiO2 (%):  [28 %] 28 % (09/01 1455) Weight:  [138.347 kg (305 lb)] 138.347 kg (305 lb) (09/01 1930)  Labs:  Recent Labs  12/12/13 0550  HGB 10.7*    Recent Labs  12/12/13 0550  WBC 10.8*  RBC 3.70*  HCT 32.4*  PLT 229    Recent Labs  12/12/13 0550  NA 138  K 3.6*  CL 102  CO2 25  BUN 11  CREATININE 0.85  GLUCOSE 142*  CALCIUM 8.4   No results found for this basename: LABPT, INR,  in the last 72 hours  Physical Exam:  Neurologically intact ABD soft Neurovascular intact Sensation intact distally Intact pulses distally Dorsiflexion/Plantar flexion intact Incision: dressing C/D/I No cellulitis present Compartment soft  Assessment/Plan:  1 Day Post-Op Procedure(s) (LRB): TOTAL KNEE ARTHROPLASTY (Right) Advance diet Up with therapy D/C IV fluids Plan for discharge tomorrow Discharge home with home health if cleared by PT and doing well Dressing change to mepilex. Increase hydrocodone from 7.5 to 10mg  Increase robaxin from 500mg  to 750mg . Continue on ASA 325mg  BID x 2 weeks for DVT prevention Follow up in office 2 weeks post op.    Melanie Hood, Larwance Sachs 12/12/2013, 7:42 AM

## 2013-12-12 NOTE — Progress Notes (Signed)
Physical Therapy Treatment Patient Details Name: Melanie Hood MRN: [id number] DOB: Oct 16, 1955 Today's Date: 12/12/2013    History of Present Illness Pt is a 58 y/o female admitted s/p elective R TKA.     PT Comments    Pt progressing well with mobility. Hopeful to D/C home tomorrow. Has 1 STE house that can be addressed in next session.   Follow Up Recommendations  Home health PT;Supervision/Assistance - 24 hour     Equipment Recommendations  Rolling walker with 5" wheels;3in1 (PT)    Recommendations for Other Services       Precautions / Restrictions Precautions Precautions: Knee Precaution Comments: reinforced no pillow under knee Required Braces or Orthoses: Knee Immobilizer - Right Restrictions Weight Bearing Restrictions: No RLE Weight Bearing: Weight bearing as tolerated    Mobility  Bed Mobility Overal bed mobility: Needs Assistance       Supine to sit: Supervision     General bed mobility comments: pt able to use gt belt to lift Rt LE into bed  Transfers Overall transfer level: Needs assistance Equipment used: Rolling walker (2 wheeled) Transfers: Sit to/from Stand Sit to Stand: Supervision         General transfer comment: cues for hand placement on RW  Ambulation/Gait Ambulation/Gait assistance: Supervision Ambulation Distance (Feet): 150 Feet Assistive device: Rolling walker (2 wheeled) Gait Pattern/deviations: Step-to pattern;Step-through pattern;Decreased step length - left;Decreased stance time - right;Antalgic;Wide base of support;Trunk flexed Gait velocity: very decreased Gait velocity interpretation: Below normal speed for age/gender General Gait Details: pt initially with step to gt; was able to progress to step through with visual and verbal cueing; pt with heavy lean anteriorly; RW adjusted; cues for upright posture    Stairs            Wheelchair Mobility    Modified Rankin (Stroke Patients Only)       Balance Overall  balance assessment: Needs assistance Sitting-balance support: No upper extremity supported;Feet supported Sitting balance-Leahy Scale: Good Sitting balance - Comments: performed exercises sitting EOB   Standing balance support: During functional activity;Bilateral upper extremity supported Standing balance-Leahy Scale: Poor Standing balance comment: relies heavily on RW for support                    Cognition Arousal/Alertness: Awake/alert Behavior During Therapy: WFL for tasks assessed/performed Overall Cognitive Status: Within Functional Limits for tasks assessed                      Exercises Total Joint Exercises Ankle Circles/Pumps: AROM;Both;10 reps;Seated Quad Sets: AROM;Right;10 reps Heel Slides: AAROM;Right;Both;10 reps;Supine Hip ABduction/ADduction: AAROM;Right;10 reps Straight Leg Raises: AAROM;Right;10 reps Long Arc Quad: AROM;Strengthening;Right;10 reps;Supine Goniometric ROM: AROM flexing in sitting is 50degrees     General Comments General comments (skin integrity, edema, etc.): CPM readjusted multiple times for comfort; pt called PT back in room for more adjusting after session concluded       Pertinent Vitals/Pain Pain Assessment: 0-10 Pain Score: 5  Pain Location: Rt knee  Pain Descriptors / Indicators: Aching Pain Intervention(s): Monitored during session;RN gave pain meds during session;Repositioned    Home Living Family/patient expects to be discharged to:: Private residence Living Arrangements: Children;Other relatives Available Help at Discharge: Family;Available 24 hours/day Type of Home: House Home Access: Stairs to enter   Home Layout: Two level;Able to live on main level with bedroom/bathroom Home Equipment: Kasandra Knudsen - single point      Prior Function Level of Independence: Independent with assistive  device(s)      Comments: pt reports use of SPC toward the end of the day and night time to improve balance.   PT Goals  (current goals can now be found in the care plan section) Acute Rehab PT Goals Patient Stated Goal: to go home tomorrow  PT Goal Formulation: With patient Time For Goal Achievement: 12/18/13 Potential to Achieve Goals: Good Progress towards PT goals: Progressing toward goals    Frequency  7X/week    PT Plan Current plan remains appropriate    Co-evaluation             End of Session Equipment Utilized During Treatment: Gait belt;Right knee immobilizer Activity Tolerance: Patient tolerated treatment well Patient left: in bed;with call bell/phone within reach;in CPM     Time: 4503-8882 PT Time Calculation (min): 37 min  Charges:  $Gait Training: 8-22 mins $Therapeutic Exercise: 8-22 mins                    G CodesGustavus Bryant, [place] 12/12/2013, 5:20 PM

## 2013-12-13 ENCOUNTER — Encounter (HOSPITAL_COMMUNITY): Payer: Self-pay | Admitting: Orthopaedic Surgery

## 2013-12-13 LAB — CBC
HCT: 30.9 % — ABNORMAL LOW (ref 36.0–46.0)
Hemoglobin: 10.4 g/dL — ABNORMAL LOW (ref 12.0–15.0)
MCH: 29.1 pg (ref 26.0–34.0)
MCHC: 33.7 g/dL (ref 30.0–36.0)
MCV: 86.3 fL (ref 78.0–100.0)
PLATELETS: 193 10*3/uL (ref 150–400)
RBC: 3.58 MIL/uL — ABNORMAL LOW (ref 3.87–5.11)
RDW: 14 % (ref 11.5–15.5)
WBC: 15.2 10*3/uL — AB (ref 4.0–10.5)

## 2013-12-13 LAB — GLUCOSE, CAPILLARY
GLUCOSE-CAPILLARY: 135 mg/dL — AB (ref 70–99)
Glucose-Capillary: 131 mg/dL — ABNORMAL HIGH (ref 70–99)
Glucose-Capillary: 138 mg/dL — ABNORMAL HIGH (ref 70–99)

## 2013-12-13 MED ORDER — ASPIRIN 325 MG PO TBEC: 325.0000 mg | DELAYED_RELEASE_TABLET | Freq: Two times a day (BID) | ORAL | Status: DC

## 2013-12-13 MED ORDER — HYDROCODONE-ACETAMINOPHEN 10-325 MG PO TABS: 1.0000 | ORAL_TABLET | ORAL | Status: DC | PRN

## 2013-12-13 MED ORDER — METHOCARBAMOL 750 MG PO TABS: 750.0000 mg | ORAL_TABLET | Freq: Four times a day (QID) | ORAL | Status: DC | PRN

## 2013-12-13 NOTE — Care Management Note (Signed)
CARE MANAGEMENT NOTE 12/13/2013  Patient:  Melanie Hood, Melanie Hood   Account Number:  [id number]  Date Initiated:  12/13/2013  Documentation initiated by:  Ricki Miller  Subjective/Objective Assessment:   58 yr old admitted with DJD of right knee.     Action/Plan:   patient was preoperatively setup with [place], no changes. Patient has family support at discharge.   Anticipated DC Date:  12/14/2013   Anticipated DC Plan:  [place]  CM consult      PAC Choice  [place]   Choice offered to / List presented to:  C-1 Patient   DME arranged  [facility]  3-N-1  CPM      DME agency  [employer]     [place] arranged  HH-2 PT      [place].   Status of service:  Completed, signed off Medicare Important Message given?   (If response is "NO", the following Medicare IM given date fields will be blank) Date Medicare IM given:   Medicare IM given by:   Date Additional Medicare IM given:   Additional Medicare IM given by:    Discharge Disposition:  [place]  Per UR Regulation:  Reviewed for med. necessity/level of care/duration of stay

## 2013-12-13 NOTE — Progress Notes (Signed)
Physical Therapy Treatment Patient Details Name: Melanie Hood MRN: [id number] DOB: Feb 27, 1956 Today's Date: 12/13/2013    History of Present Illness Pt is a 58 y/o female admitted s/p elective R TKA.     PT Comments    Patient progressing well and able to complete stair training this AM. Reviewed HEP handout. Patient safe to D/C from a mobility standpoint based on progression towards goals set on PT eval.    Follow Up Recommendations  Home health PT;Supervision/Assistance - 24 hour     Equipment Recommendations  Rolling walker with 5" wheels;3in1 (PT)    Recommendations for Other Services       Precautions / Restrictions Precautions Precautions: Knee Precaution Comments: reinforced no pillow under knee Required Braces or Orthoses: Knee Immobilizer - Right Restrictions RLE Weight Bearing: Weight bearing as tolerated    Mobility  Bed Mobility Overal bed mobility: Modified Independent                Transfers Overall transfer level: Independent Equipment used: Rolling walker (2 wheeled)   Sit to Stand: Supervision         General transfer comment: cues for hand placement on RW. rocking technique to stand from lower surface  Ambulation/Gait Ambulation/Gait assistance: Supervision Ambulation Distance (Feet): 200 Feet Assistive device: Rolling walker (2 wheeled) Gait Pattern/deviations: Step-through pattern;Decreased stride length Gait velocity: increasing   General Gait Details: Patient with step through pattern this session. Cues for upright posture as patient tends to lean over top of RW.    Stairs Stairs: Yes Stairs assistance: Min guard Stair Management: Step to pattern;Forwards;With walker Number of Stairs: 1 General stair comments: Patient cued for sequence and technique with RW  Wheelchair Mobility    Modified Rankin (Stroke Patients Only)       Balance                                    Cognition Arousal/Alertness:  Awake/alert Behavior During Therapy: WFL for tasks assessed/performed Overall Cognitive Status: Within Functional Limits for tasks assessed                      Exercises Total Joint Exercises Quad Sets: AROM;Right;10 reps Heel Slides: AROM;Right;10 reps Hip ABduction/ADduction: Right;10 reps;AROM Straight Leg Raises: AAROM;Right;10 reps    General Comments        Pertinent Vitals/Pain Pain Assessment: No/denies pain    Home Living                      Prior Function            PT Goals (current goals can now be found in the care plan section) Progress towards PT goals: Progressing toward goals    Frequency  7X/week    PT Plan Current plan remains appropriate    Co-evaluation             End of Session Equipment Utilized During Treatment: Gait belt;Right knee immobilizer Activity Tolerance: Patient tolerated treatment well Patient left: in chair;with call bell/phone within reach     Time: 0755-0826 PT Time Calculation (min): 31 min  Charges:  $Gait Training: 8-22 mins $Therapeutic Exercise: 8-22 mins                    G Codes:      Jacqualyn Posey 12/13/2013, 8:32 AM 12/13/2013 Jacqualyn Posey PTA [phone]  pager [phone] office

## 2013-12-13 NOTE — Progress Notes (Signed)
Occupational Therapy Treatment Patient Details Name: Melanie Hood MRN: [id number] DOB: [birth year] Today's Date: 12/13/2013    History of present illness Pt is a 58 y/o female admitted s/p elective R TKA.    OT comments  Pt seen today for ADL session. Pt practiced LB dressing with therapist assist to don socks and shoes and is progressing with mobility. Pt has desire to be independent and plans to return for surgery on her left knee.   Follow Up Recommendations  No OT follow up;Supervision/Assistance - 24 hour    Equipment Recommendations  3 in 1 bedside comode    Recommendations for Other Services      Precautions / Restrictions Precautions Precautions: Knee Required Braces or Orthoses: Knee Immobilizer - Right Restrictions Weight Bearing Restrictions: Yes RLE Weight Bearing: Weight bearing as tolerated       Mobility Bed Mobility               General bed mobility comments: Pt sitting up in recliner.  Transfers Overall transfer level: Modified independent Equipment used: Rolling walker (2 wheeled)                      ADL Overall ADL's : Needs assistance/impaired             Lower Body Bathing: Minimal assistance;Sit to/from stand Lower Body Bathing Details (indicate cue type and reason): eduacted pt on use of long handled sponge for ease of washing/drying Bil feet.      Lower Body Dressing: Minimal assistance;Sit to/from stand Lower Body Dressing Details (indicate cue type and reason): Pt able to don underwear and shorts but required assistance with socks and shoes. Pt reports that her family can assist with LB dressing. Toilet Transfer: Supervision/safety;Ambulation;BSC;RW   Toileting- Clothing Manipulation and Hygiene: Supervision/safety;Sit to/from stand       Functional mobility during ADLs: Supervision/safety;Rolling walker General ADL Comments: Pt ambulated to bathroom for toileting and performed oral care at sink with Supervision. Pt  returned to recliner and dressed herself with assistance for donning socks and shoes to prepare for d/c.                 Cognition  Arousal/Alertness: Awake/Alert Behavior During Therapy: WFL for tasks assessed/performed Overall Cognitive Status: Within Functional Limits for tasks assessed                                    Pertinent Vitals/ Pain       Pain Assessment: No/denies pain         Frequency Min 2X/week     Progress Toward Goals  OT Goals(current goals can now be found in the care plan section)  Progress towards OT goals: Progressing toward goals     Plan Discharge plan remains appropriate       End of Session Equipment Utilized During Treatment: Rolling walker;Right knee immobilizer   Activity Tolerance Patient tolerated treatment well   Patient Left in chair;with call bell/phone within reach   Nurse Communication Other (comment) (pt dressed and ready for d/c from OT standpoint)        Time: 0263-7858 OT Time Calculation (min): 41 min  Charges: OT General Charges $OT Visit: 1 Procedure OT Treatments $Self Care/Home Management : 38-52 mins  Melanie, Hood [phone] 12/13/2013, 3:03 PM

## 2013-12-13 NOTE — Progress Notes (Signed)
Came to visit patient at bedside on behalf of [employer] program for [place] employees/dependents with [employer]. She is already active with [facility] for DM management with the PharmD. Currently working with therapy. However, working with therapy. Agreeable to a post hospital discharge call. Left contact information at bedside. Made inpatient RNCM aware that patient active with [employer] [facility] [employer] program.  Marthenia Rolling, MSN- RN,BSN- [facility] Liaison- [phone]

## 2013-12-13 NOTE — Discharge Summary (Signed)
Patient ID: Melanie Hood MRN: [id number] DOB/AGE: Jul 19, 1955 58 y.o.  Admit date: 12/11/2013 Discharge date: 12/13/2013  Admission Diagnoses:  Principal Problem:   Right knee DJD Active Problems:   Diabetes   Morbid obesity   Discharge Diagnoses:  Same  Past Medical History  Diagnosis Date  . Hypothyroidism   . GERD (gastroesophageal reflux disease)   . Diverticular disease of colon   . Arthritis   . Vein, varicose   . Heart disease   . Hypertension     mild left vent dysfunction   . Sleep apnea   . Diabetes mellitus     type 11     Surgeries: Procedure(s): TOTAL KNEE ARTHROPLASTY on 12/11/2013   Consultants:    Discharged Condition: Improved  Hospital Course: Melanie Hood is an 58 y.o. female who was admitted 12/11/2013 for operative treatment ofRight knee DJD. Patient has severe unremitting pain that affects sleep, daily activities, and work/hobbies. After pre-op clearance the patient was taken to the operating room on 12/11/2013 and underwent  Procedure(s): TOTAL KNEE ARTHROPLASTY.    Patient was given perioperative antibiotics: Anti-infectives   Start     Dose/Rate Route Frequency Ordered Stop   12/12/13 1000  valACYclovir (VALTREX) tablet 500 mg     500 mg Oral Daily 12/11/13 1454     12/11/13 1630  ceFAZolin (ANCEF) IVPB 2 g/50 mL premix     2 g 100 mL/hr over 30 Minutes Intravenous Every 6 hours 12/11/13 1454 12/11/13 2208   12/11/13 0600  ceFAZolin (ANCEF) 3 g in dextrose 5 % 50 mL IVPB     3 g 160 mL/hr over 30 Minutes Intravenous On call to O.R. 12/10/13 1417 12/11/13 1030       Patient was given sequential compression devices, early ambulation, and chemoprophylaxis to prevent DVT.  Patient benefited maximally from hospital stay and there were no complications.    Recent vital signs: Patient Vitals for the past 24 hrs:  BP Temp Temp src Pulse Resp SpO2  12/13/13 1021 126/62 mmHg - - - - -  12/13/13 0654 126/62 mmHg 98.7 F (37.1 C) - 88 16 100 %   12/13/13 0000 - - - - 16 -  12/12/13 2102 124/68 mmHg 98.5 F (36.9 C) - 89 16 100 %  12/12/13 2000 - - - - 16 -  12/12/13 1328 123/64 mmHg 98.1 F (36.7 C) Oral 88 - 100 %     Recent laboratory studies:  Recent Labs  12/12/13 0550 12/13/13 0739  WBC 10.8* 15.2*  HGB 10.7* 10.4*  HCT 32.4* 30.9*  PLT 229 193  NA 138  --   K 3.6*  --   CL 102  --   CO2 25  --   BUN 11  --   CREATININE 0.85  --   GLUCOSE 142*  --   CALCIUM 8.4  --      Discharge Medications:     Medication List    STOP taking these medications       aspirin 81 MG tablet  Replaced by:  aspirin 325 MG EC tablet     HYDROcodone-acetaminophen 5-325 MG per tablet  Commonly known as:  NORCO/VICODIN  Replaced by:  HYDROcodone-acetaminophen 10-325 MG per tablet     lidocaine 5 %  Commonly known as:  LIDODERM     traMADol 50 MG tablet  Commonly known as:  ULTRAM      TAKE these medications       acetaminophen 650  MG CR tablet  Commonly known as:  TYLENOL  Take 1,300 mg by mouth every 12 (twelve) hours as needed for pain.     albuterol 108 (90 BASE) MCG/ACT inhaler  Commonly known as:  PROVENTIL HFA;VENTOLIN HFA  Inhale 2 puffs into the lungs every 6 (six) hours as needed for wheezing or shortness of breath.     aspirin 325 MG EC tablet  Take 1 tablet (325 mg total) by mouth 2 (two) times daily after a meal.     atorvastatin 10 MG tablet  Commonly known as:  LIPITOR  Take 10 mg by mouth daily.     Biotin 5000 MCG Caps  Take 1 capsule by mouth daily.     cetirizine 10 MG tablet  Commonly known as:  ZYRTEC  Take 10 mg by mouth daily.     Chromium-Cinnamon 50-500 MCG-MG Caps  Take 1 capsule by mouth daily.     dicyclomine 10 MG capsule  Commonly known as:  BENTYL  Take 10 mg by mouth every 6 (six) hours as needed for spasms.     diltiazem 120 MG 24 hr tablet  Commonly known as:  CARDIZEM LA  Take 120 mg by mouth daily.     fluticasone 50 MCG/ACT nasal spray  Commonly known as:   FLONASE  Place 2 sprays into both nostrils daily as needed for allergies.     hydrochlorothiazide 12.5 MG capsule  Commonly known as:  MICROZIDE  Take 12.5 mg by mouth daily.     HYDROcodone-acetaminophen 10-325 MG per tablet  Commonly known as:  NORCO  Take 1-2 tablets by mouth every 4 (four) hours as needed for moderate pain or severe pain.     levothyroxine 175 MCG tablet  Commonly known as:  SYNTHROID, LEVOTHROID  Take 87.5-175 mcg by mouth daily before breakfast. Take a whole tablet (175) mon-sat and Sunday take a half tablet (87.5)     lisinopril 2.5 MG tablet  Commonly known as:  PRINIVIL,ZESTRIL  Take 2.5 mg by mouth daily.     LORazepam 0.5 MG tablet  Commonly known as:  ATIVAN  Take 0.25-0.5 mg by mouth 2 (two) times daily as needed for anxiety. as needed for anxiety     metFORMIN 500 MG (MOD) 24 hr tablet  Commonly known as:  GLUMETZA  Take 500 mg by mouth every evening.     methocarbamol 750 MG tablet  Commonly known as:  ROBAXIN  Take 1 tablet (750 mg total) by mouth every 6 (six) hours as needed for muscle spasms.     pantoprazole 40 MG tablet  Commonly known as:  PROTONIX  Take 40 mg by mouth daily.     therapeutic multivitamin-minerals tablet  Take 1 tablet by mouth daily.     valACYclovir 500 MG tablet  Commonly known as:  VALTREX  Take 500 mg by mouth daily.     Vitamin D-3 5000 UNITS Tabs  Take 5,000 Units by mouth every evening.        Diagnostic Studies: Dg Chest 2 View  12/04/2013   CLINICAL DATA:  Preoperative evaluation; hypertension  EXAM: CHEST  2 VIEW  COMPARISON:  September 15, 2012  FINDINGS: There is no edema or consolidation. The heart size and pulmonary vascularity are normal. No adenopathy. There is degenerative change in the thoracic spine.  IMPRESSION: No edema or consolidation.   Electronically Signed   By: Lowella Grip M.D.   On: 12/04/2013 10:20    Disposition: 01-Home or  Self Care      Discharge Instructions   Call MD /  Call 911    Complete by:  As directed   If you experience chest pain or shortness of breath, CALL 911 and be transported to the hospital emergency room.  If you develope a fever above 101 F, pus (white drainage) or increased drainage or redness at the wound, or calf pain, call your surgeon's office.     Constipation Prevention    Complete by:  As directed   Drink plenty of fluids.  Prune juice may be helpful.  You may use a stool softener, such as Colace (over the counter) 100 mg twice a day.  Use MiraLax (over the counter) for constipation as needed.     Diet - low sodium heart healthy    Complete by:  As directed      Increase activity slowly as tolerated    Complete by:  As directed            Follow-up Information   Follow up with Hessie Dibble, MD. Call in 2 weeks.   Specialty:  Orthopedic Surgery   Contact information:   [place] [address] [phone]       Follow up with [facility]. (Someone from [facility] will contact you concerning start date and time for physical therapy.)    Contact information:   [address] [place] [address] [phone]        Signed: Rich Fuchs 12/13/2013, 11:53 AM

## 2013-12-26 ENCOUNTER — Other Ambulatory Visit: Payer: Self-pay | Admitting: Orthopaedic Surgery

## 2014-01-14 ENCOUNTER — Encounter (HOSPITAL_COMMUNITY): Payer: Self-pay | Admitting: Emergency Medicine

## 2014-01-14 ENCOUNTER — Emergency Department (HOSPITAL_COMMUNITY)
Admission: EM | Admit: 2014-01-14 | Discharge: 2014-01-14 | Disposition: A | Discharge status: Home / Self Care | Payer: 59 | Attending: Emergency Medicine | Admitting: Emergency Medicine

## 2014-01-14 ENCOUNTER — Emergency Department (HOSPITAL_COMMUNITY): Payer: 59

## 2014-01-14 DIAGNOSIS — K219 Gastro-esophageal reflux disease without esophagitis: Secondary | ICD-10-CM | POA: Diagnosis not present

## 2014-01-14 DIAGNOSIS — M1990 Unspecified osteoarthritis, unspecified site: Secondary | ICD-10-CM | POA: Diagnosis not present

## 2014-01-14 DIAGNOSIS — Z87891 Personal history of nicotine dependence: Secondary | ICD-10-CM | POA: Diagnosis not present

## 2014-01-14 DIAGNOSIS — I10 Essential (primary) hypertension: Secondary | ICD-10-CM | POA: Diagnosis not present

## 2014-01-14 DIAGNOSIS — E119 Type 2 diabetes mellitus without complications: Secondary | ICD-10-CM | POA: Insufficient documentation

## 2014-01-14 DIAGNOSIS — Z7982 Long term (current) use of aspirin: Secondary | ICD-10-CM | POA: Insufficient documentation

## 2014-01-14 DIAGNOSIS — E039 Hypothyroidism, unspecified: Secondary | ICD-10-CM | POA: Diagnosis not present

## 2014-01-14 DIAGNOSIS — Z79899 Other long term (current) drug therapy: Secondary | ICD-10-CM | POA: Insufficient documentation

## 2014-01-14 DIAGNOSIS — Z7952 Long term (current) use of systemic steroids: Secondary | ICD-10-CM | POA: Insufficient documentation

## 2014-01-14 DIAGNOSIS — R0602 Shortness of breath: Secondary | ICD-10-CM

## 2014-01-14 DIAGNOSIS — R609 Edema, unspecified: Secondary | ICD-10-CM

## 2014-01-14 DIAGNOSIS — M79609 Pain in unspecified limb: Secondary | ICD-10-CM

## 2014-01-14 LAB — CBC WITH DIFFERENTIAL/PLATELET
BASOS PCT: 0 % (ref 0–1)
Basophils Absolute: 0 10*3/uL (ref 0.0–0.1)
EOS ABS: 0.1 10*3/uL (ref 0.0–0.7)
EOS PCT: 1 % (ref 0–5)
HCT: 35.2 % — ABNORMAL LOW (ref 36.0–46.0)
Hemoglobin: 11.4 g/dL — ABNORMAL LOW (ref 12.0–15.0)
Lymphocytes Relative: 32 % (ref 12–46)
Lymphs Abs: 3.3 10*3/uL (ref 0.7–4.0)
MCH: 28.4 pg (ref 26.0–34.0)
MCHC: 32.4 g/dL (ref 30.0–36.0)
MCV: 87.8 fL (ref 78.0–100.0)
Monocytes Absolute: 1.1 10*3/uL — ABNORMAL HIGH (ref 0.1–1.0)
Monocytes Relative: 10 % (ref 3–12)
Neutro Abs: 5.9 10*3/uL (ref 1.7–7.7)
Neutrophils Relative %: 57 % (ref 43–77)
PLATELETS: 329 10*3/uL (ref 150–400)
RBC: 4.01 MIL/uL (ref 3.87–5.11)
RDW: 14.2 % (ref 11.5–15.5)
WBC: 10.4 10*3/uL (ref 4.0–10.5)

## 2014-01-14 LAB — COMPREHENSIVE METABOLIC PANEL
ALT: 12 U/L (ref 0–35)
AST: 17 U/L (ref 0–37)
Albumin: 4 g/dL (ref 3.5–5.2)
Alkaline Phosphatase: 59 U/L (ref 39–117)
Anion gap: 14 (ref 5–15)
BUN: 12 mg/dL (ref 6–23)
CALCIUM: 9.5 mg/dL (ref 8.4–10.5)
CO2: 23 mEq/L (ref 19–32)
Chloride: 99 mEq/L (ref 96–112)
Creatinine, Ser: 0.9 mg/dL (ref 0.50–1.10)
GFR calc non Af Amer: 69 mL/min — ABNORMAL LOW (ref >90)
GFR, EST AFRICAN AMERICAN: 80 mL/min — AB (ref >90)
GLUCOSE: 104 mg/dL — AB (ref 70–99)
Potassium: 3.8 mEq/L (ref 3.7–5.3)
SODIUM: 136 meq/L — AB (ref 137–147)
TOTAL PROTEIN: 8.6 g/dL — AB (ref 6.0–8.3)
Total Bilirubin: 0.4 mg/dL (ref 0.3–1.2)

## 2014-01-14 LAB — TSH: TSH: 8.02 u[IU]/mL — ABNORMAL HIGH (ref 0.350–4.500)

## 2014-01-14 LAB — I-STAT TROPONIN, ED: Troponin i, poc: 0 ng/mL (ref 0.00–0.08)

## 2014-01-14 MED ORDER — IOHEXOL 350 MG/ML SOLN
100.0000 mL | Freq: Once | INTRAVENOUS | Status: AC | PRN
  Administered 2014-01-14: 100 mL via INTRAVENOUS

## 2014-01-14 NOTE — Discharge Instructions (Signed)

## 2014-01-14 NOTE — Progress Notes (Signed)
VASCULAR LAB PRELIMINARY  PRELIMINARY  PRELIMINARY  PRELIMINARY  Right lower extremity venous duplex completed.    Preliminary report:  Right:  No evidence of DVT, superficial thrombosis, or [facility]'s cyst.  Taisei Bonnette, RVS 01/14/2014, 8:09 PM

## 2014-01-14 NOTE — ED Notes (Signed)
Pt was sent from Dr Drema [place] office after follow-up apt from her knee replacement one month ago.  Pt had shob yesterday when ambulating up stairs but subsided.  Pt had HR 110 in PCP office.  Pt was sent here to rule out PE.

## 2014-01-14 NOTE — ED Provider Notes (Signed)
CSN: [id number]     Arrival date & time 01/14/14  1528 History   First MD Initiated Contact with Patient 01/14/14 1820     Chief Complaint  Patient presents with  . Shortness of Breath  . sent from PCP to rule out PE      (Consider location/radiation/quality/duration/timing/severity/associated sxs/prior Treatment) HPI Comments: Patient presents today with a chief complaint of SOB.  She was sent to the ED from her PCP to rule out PE.  She reports SOB began yesterday and has been progressively worsening.  She had a knee replacement performed one month ago.   She reports that she is also having pain and swelling of the RLE, but states that she thinks that this has been present since the surgery.  She does have some erythema of the RLE, but states that this is actually improving.  She denies chest pain, cough, fever, or chills.  Denies hemoptysis.  She is also tachycardic.  She reports that she has a history of Graves Disease and becomes tachycardic at times when her TSH is low.  She has been taking Norco for her RLE pain, which she reports has been helping.  She denies history of DVT or PE.  She is not on any estrogen containing medications.  No prolonged travel in the past 4 weeks.  She denies prior cardiac history.  The history is provided by the patient.    Past Medical History  Diagnosis Date  . Hypothyroidism   . GERD (gastroesophageal reflux disease)   . Diverticular disease of colon   . Arthritis   . Vein, varicose   . Heart disease   . Hypertension     mild left vent dysfunction   . Sleep apnea   . Diabetes mellitus     type 11    Past Surgical History  Procedure Laterality Date  . Appendectomy    . Cholecystectomy    . Abdominal hysterectomy    . Varicose vein surgery    . Foot arthroplasty      rt foot as child  . Upper gastrointestinal endoscopy    . Knee arthroscopy  04/20/2011    Procedure: ARTHROSCOPY KNEE;  Surgeon: Hessie Dibble, MD;  Location: [facility];  Service: Orthopedics;  Laterality: Right;  right knee arthroscopy partial medial menisectomy and chondroplasty.  . Total knee arthroplasty Right 12/11/2013    Procedure: TOTAL KNEE ARTHROPLASTY;  Surgeon: Hessie Dibble, MD;  Location: [facility];  Service: Orthopedics;  Laterality: Right;   No family history on file. History  Substance Use Topics  . Smoking status: Former Research scientist (life sciences)  . Smokeless tobacco: Not on file  . Alcohol Use: Yes     Comment: rarely    OB History   Grav Para Term Preterm Abortions TAB SAB Ect Mult Living                 Review of Systems  All other systems reviewed and are negative.     Allergies  Codeine; Nsaids; Septra; Tetracyclines & related; and Erythromycin  Home Medications   Prior to Admission medications   Medication Sig Start Date End Date Taking? Authorizing Provider  acetaminophen (TYLENOL) 650 MG CR tablet Take 1,300 mg by mouth every 12 (twelve) hours as needed for pain.    Historical Provider, MD  albuterol (PROVENTIL HFA;VENTOLIN HFA) 108 (90 BASE) MCG/ACT inhaler Inhale 2 puffs into the lungs every 6 (six) hours as needed for wheezing or shortness of breath.  Historical Provider, MD  aspirin EC 325 MG EC tablet Take 1 tablet (325 mg total) by mouth 2 (two) times daily after a meal. 12/13/13   Larwance Sachs Nida, PA-C  atorvastatin (LIPITOR) 10 MG tablet Take 10 mg by mouth daily.    Historical Provider, MD  Biotin 5000 MCG CAPS Take 1 capsule by mouth daily.    Historical Provider, MD  cetirizine (ZYRTEC) 10 MG tablet Take 10 mg by mouth daily.    Historical Provider, MD  Cholecalciferol (VITAMIN D-3) 5000 UNITS TABS Take 5,000 Units by mouth every evening.    Historical Provider, MD  Chromium-Cinnamon 50-500 MCG-MG CAPS Take 1 capsule by mouth daily.    Historical Provider, MD  dicyclomine (BENTYL) 10 MG capsule Take 10 mg by mouth every 6 (six) hours as needed for spasms.     Historical Provider, MD  diltiazem (CARDIZEM LA) 120 MG 24 hr  tablet Take 120 mg by mouth daily.    Historical Provider, MD  fluticasone (FLONASE) 50 MCG/ACT nasal spray Place 2 sprays into both nostrils daily as needed for allergies.     Historical Provider, MD  hydrochlorothiazide (MICROZIDE) 12.5 MG capsule Take 12.5 mg by mouth daily.    Historical Provider, MD  HYDROcodone-acetaminophen (NORCO) 10-325 MG per tablet Take 1-2 tablets by mouth every 4 (four) hours as needed for moderate pain or severe pain. 12/13/13   Rich Fuchs, PA-C  levothyroxine (SYNTHROID, LEVOTHROID) 175 MCG tablet Take 87.5-175 mcg by mouth daily before breakfast. Take a whole tablet (175) mon-sat and Sunday take a half tablet (87.5)    Historical Provider, MD  lisinopril (PRINIVIL,ZESTRIL) 2.5 MG tablet Take 2.5 mg by mouth daily.    Historical Provider, MD  LORazepam (ATIVAN) 0.5 MG tablet Take 0.25-0.5 mg by mouth 2 (two) times daily as needed for anxiety. as needed for anxiety    Historical Provider, MD  metFORMIN (GLUMETZA) 500 MG (MOD) 24 hr tablet Take 500 mg by mouth every evening.    Historical Provider, MD  methocarbamol (ROBAXIN) 750 MG tablet Take 1 tablet (750 mg total) by mouth every 6 (six) hours as needed for muscle spasms. 12/13/13   Larwance Sachs Nida, PA-C  pantoprazole (PROTONIX) 40 MG tablet Take 40 mg by mouth daily.     Historical Provider, MD  therapeutic multivitamin-minerals (THERAGRAN-M) tablet Take 1 tablet by mouth daily.      Historical Provider, MD  valACYclovir (VALTREX) 500 MG tablet Take 500 mg by mouth daily.    Historical Provider, MD   BP 124/73  Pulse 82  Temp(Src) 98.5 F (36.9 C) (Oral)  SpO2 99% Physical Exam  Nursing note and vitals reviewed. Constitutional: She appears well-developed and well-nourished.  HENT:  Head: Normocephalic and atraumatic.  Mouth/Throat: Oropharynx is clear and moist.  Neck: Normal range of motion. Neck supple.  Cardiovascular: Regular rhythm and normal heart sounds.  Tachycardia present.   Pulses:       Dorsalis pedis pulses are 2+ on the right side, and 2+ on the left side.  Pulmonary/Chest: Effort normal and breath sounds normal.  Musculoskeletal: Normal range of motion.  Mild erythema of the right knee, which patient reports is improving.  Mild swelling of the RLE, which patient reports has been present since surgery.  No warmth of the RLE Scar over right knee appears to well healed.    Neurological: She is alert.  Skin: Skin is warm and dry. She is not diaphoretic.  Psychiatric: She has a normal mood  and affect.    ED Course  Procedures (including critical care time) Labs Review Labs Reviewed  CBC WITH DIFFERENTIAL  COMPREHENSIVE METABOLIC PANEL  TSH  I-STAT TROPOININ, ED    Imaging Review Ct Angio Chest Pe W/cm &/or Wo Cm  01/14/2014   CLINICAL DATA:  Shortness of breath and tachycardia 4 weeks after knee surgery. Evaluate for pulmonary embolism.  EXAM: CT ANGIOGRAPHY CHEST WITH CONTRAST  TECHNIQUE: Multidetector CT imaging of the chest was performed using the standard protocol during bolus administration of intravenous contrast. Multiplanar CT image reconstructions and MIPs were obtained to evaluate the vascular anatomy.  CONTRAST:  120mL OMNIPAQUE IOHEXOL 350 MG/ML SOLN  COMPARISON:  Radiographs 12/04/2013 and 09/15/2012. Abdominal CT 05/03/2012.  FINDINGS: Vascular: The pulmonary arteries are well opacified with contrast. There is no evidence of acute pulmonary embolism. There is no significant atherosclerosis.  Mediastinum: Small axillary lymph nodes are not pathologically enlarged. There are no enlarged mediastinal or hilar lymph nodes. The thyroid gland, trachea and esophagus appear normal.  Lungs/Pleura: There is no pleural or pericardial effusion.Dependent density in the left lower lobe is most consistent with progressive atelectasis or scarring. There is no suspicious residual focal nodularity in this area. Nodular thickening of the minor fissure on image 49 is characteristically  benign. The lungs are otherwise clear.  Upper abdomen: Unremarkable.  There is no adrenal mass.  Musculoskeletal/Chest wall: No chest wall lesion or acute osseous findings.  Review of the MIP images confirms the above findings.  IMPRESSION: 1. No evidence of acute pulmonary embolism or other acute process. 2. Mildly increased atelectasis posteriorly at the left lung base.   Electronically Signed   By: Camie Patience M.D.   On: 01/14/2014 20:33     EKG Interpretation   Date/Time:  Monday January 14 2014 19:01:07 EDT Ventricular Rate:  74 PR Interval:  205 QRS Duration: 82 QT Interval:  388 QTC Calculation: 430 R Axis:   54 Text Interpretation:  Sinus rhythm Borderline prolonged PR interval  Confirmed by WARD,  DO, KRISTEN ([id number]) on 01/14/2014 7:17:57 PM      MDM   Final diagnoses:  None   Patient presents today with a chief complaint of SOB that has been present since yesterday.  She had a knee replacement on 12/11/13.  She was sent to the ED by her PCP to rule out PE.  Lower extremity Doppler ultrasound negative for DVT.  CT angio chest is also negative.  Labs today unremarkable.  No ischemic changes on EKG.  Troponin negative.  Patient is not hypoxic and no signs of respiratory distress.  Patient initially tachycardic, which resolved during ED course.  Feel that the patient is stable for discharge.  Return precautions given.    Hyman Bible, PA-C 01/15/14 [phone]

## 2014-01-14 NOTE — ED Notes (Signed)
Page to Vascular for ETA of Venous Duplex.

## 2014-01-15 NOTE — ED Provider Notes (Signed)
Medical screening examination/treatment/procedure(s) were performed by non-physician practitioner and as supervising physician I was immediately available for consultation/collaboration.   EKG Interpretation   Date/Time:  Monday January 14 2014 19:01:07 EDT Ventricular Rate:  74 PR Interval:  205 QRS Duration: 82 QT Interval:  388 QTC Calculation: 430 R Axis:   54 Text Interpretation:  Sinus rhythm Borderline prolonged PR interval  Confirmed by WARD,  DO, KRISTEN [phone]) on 01/14/2014 7:17:57 PM        [place], DO 01/15/14 0107

## 2014-01-17 ENCOUNTER — Ambulatory Visit (INDEPENDENT_AMBULATORY_CARE_PROVIDER_SITE_OTHER): Payer: Self-pay | Admitting: Family Medicine

## 2014-01-17 VITALS — BP 132/88 | Wt 285.0 lb

## 2014-01-17 DIAGNOSIS — E119 Type 2 diabetes mellitus without complications: Secondary | ICD-10-CM

## 2014-01-23 ENCOUNTER — Encounter (HOSPITAL_COMMUNITY): Payer: Self-pay | Admitting: Pharmacy Technician

## 2014-01-25 NOTE — Pre-Procedure Instructions (Signed)
Melanie Hood  01/25/2014   Your procedure is scheduled on:  February 05, 2014  Report to [facility] Admitting at 12:45 PM.  Call this number if you have problems the morning of surgery: [phone]   Remember:   Do not eat food or drink liquids after midnight.   Take these medicines the morning of surgery with A SIP OF WATER: cetirizine (ZYRTEC), diltiazem (CARDIZEM LA), levothyroxine (SYNTHROID, LEVOTHROID),       If needed: tylenol, inhaler-bring with you, pain pill, muscle relaxer, ativan    STOP ASPIRIN, HERBAL MEDICATIONS ONE WEEK PRIOR TO SURGERY   Do not wear jewelry, make-up or nail polish.  Do not wear lotions, powders, or perfumes. You may wear deodorant.  Do not shave 48 hours prior to surgery. Men may shave face and neck.  Do not bring valuables to the hospital.  [facility] is not responsible  for any belongings or valuables.               Contacts, dentures or bridgework may not be worn into surgery.  Leave suitcase in the car. After surgery it may be brought to your room.  For patients admitted to the hospital, discharge time is determined by your   treatment team.               Patients discharged the day of surgery will not be allowed to drive home.  Name and phone number of your driver: family/friend     Please read over the following fact sheets that you were given: Pain Booklet, Coughing and Deep Breathing, Blood Transfusion Information and Surgical Site Infection Prevention

## 2014-01-28 ENCOUNTER — Encounter (HOSPITAL_COMMUNITY)
Admission: RE | Admit: 2014-01-28 | Discharge: 2014-01-28 | Disposition: A | Discharge status: Home / Self Care | Payer: 59 | Source: Ambulatory Visit | Attending: Orthopaedic Surgery | Admitting: Orthopaedic Surgery

## 2014-01-28 ENCOUNTER — Encounter (HOSPITAL_COMMUNITY): Payer: Self-pay

## 2014-01-28 DIAGNOSIS — Z01818 Encounter for other preprocedural examination: Secondary | ICD-10-CM | POA: Insufficient documentation

## 2014-01-28 DIAGNOSIS — M179 Osteoarthritis of knee, unspecified: Secondary | ICD-10-CM | POA: Diagnosis not present

## 2014-01-28 LAB — CBC WITH DIFFERENTIAL/PLATELET
BASOS PCT: 0 % (ref 0–1)
Basophils Absolute: 0 10*3/uL (ref 0.0–0.1)
EOS ABS: 0.1 10*3/uL (ref 0.0–0.7)
EOS PCT: 1 % (ref 0–5)
HCT: 38.3 % (ref 36.0–46.0)
Hemoglobin: 12.4 g/dL (ref 12.0–15.0)
LYMPHS ABS: 3.7 10*3/uL (ref 0.7–4.0)
Lymphocytes Relative: 35 % (ref 12–46)
MCH: 28.7 pg (ref 26.0–34.0)
MCHC: 32.4 g/dL (ref 30.0–36.0)
MCV: 88.7 fL (ref 78.0–100.0)
Monocytes Absolute: 0.9 10*3/uL (ref 0.1–1.0)
Monocytes Relative: 9 % (ref 3–12)
NEUTROS PCT: 55 % (ref 43–77)
Neutro Abs: 5.8 10*3/uL (ref 1.7–7.7)
PLATELETS: 372 10*3/uL (ref 150–400)
RBC: 4.32 MIL/uL (ref 3.87–5.11)
RDW: 14 % (ref 11.5–15.5)
WBC: 10.6 10*3/uL — ABNORMAL HIGH (ref 4.0–10.5)

## 2014-01-28 LAB — BASIC METABOLIC PANEL
Anion gap: 16 — ABNORMAL HIGH (ref 5–15)
BUN: 13 mg/dL (ref 6–23)
CO2: 22 mEq/L (ref 19–32)
CREATININE: 0.92 mg/dL (ref 0.50–1.10)
Calcium: 9.5 mg/dL (ref 8.4–10.5)
Chloride: 99 mEq/L (ref 96–112)
GFR calc Af Amer: 77 mL/min — ABNORMAL LOW (ref >90)
GFR, EST NON AFRICAN AMERICAN: 67 mL/min — AB (ref >90)
Glucose, Bld: 105 mg/dL — ABNORMAL HIGH (ref 70–99)
Potassium: 3.7 mEq/L (ref 3.7–5.3)
Sodium: 137 mEq/L (ref 137–147)

## 2014-01-28 LAB — URINE MICROSCOPIC-ADD ON: RBC / HPF: NONE SEEN RBC/hpf (ref <3)

## 2014-01-28 LAB — SURGICAL PCR SCREEN
MRSA, PCR: NEGATIVE
STAPHYLOCOCCUS AUREUS: NEGATIVE

## 2014-01-28 LAB — URINALYSIS, ROUTINE W REFLEX MICROSCOPIC
BILIRUBIN URINE: NEGATIVE
Glucose, UA: NEGATIVE mg/dL
KETONES UR: NEGATIVE mg/dL
Nitrite: NEGATIVE
Protein, ur: NEGATIVE mg/dL
SPECIFIC GRAVITY, URINE: 1.015 (ref 1.005–1.030)
UROBILINOGEN UA: 0.2 mg/dL (ref 0.0–1.0)
pH: 5.5 (ref 5.0–8.0)

## 2014-01-28 LAB — APTT: aPTT: 31 seconds (ref 24–37)

## 2014-01-28 LAB — PROTIME-INR
INR: 1.19 (ref 0.00–1.49)
Prothrombin Time: 15.2 seconds (ref 11.6–15.2)

## 2014-01-28 NOTE — Progress Notes (Signed)
T&S DOS, pt instructed to remove blood bank band placed during PAT and a sample will be obtained DOS.

## 2014-01-30 NOTE — H&P (Signed)
TOTAL KNEE ADMISSION H&P  Patient is being admitted for left total knee arthroplasty.  Subjective:  Chief Complaint:left knee pain.  HPI: Melanie Hood, 58 y.o. female, has a history of pain and functional disability in the left knee due to arthritis and has failed non-surgical conservative treatments for greater than 12 weeks to includeNSAID's and/or analgesics, corticosteriod injections, viscosupplementation injections, flexibility and strengthening excercises, supervised PT with diminished ADL's post treatment, weight reduction as appropriate and activity modification.  Onset of symptoms was gradual, starting 7 years ago with gradually worsening course since that time. The patient noted prior procedures on the knee to include  arthroscopy on the left knee(s).  Patient currently rates pain in the left knee(s) at 10 out of 10 with activity. Patient has night pain, worsening of pain with activity and weight bearing, pain that interferes with activities of daily living, pain with passive range of motion and crepitus.  Patient has evidence of subchondral sclerosis, periarticular osteophytes and joint space narrowing by imaging studies. This patient has had no. There is no active infection.  Patient Active Problem List   Diagnosis Date Noted  . Right knee DJD 12/11/2013  . Morbid obesity 12/11/2013  . Diabetes 10/16/2013   Past Medical History  Diagnosis Date  . Hypothyroidism   . GERD (gastroesophageal reflux disease)   . Diverticular disease of colon   . Arthritis   . Vein, varicose   . Heart disease   . Hypertension     mild left vent dysfunction   . Sleep apnea   . Diabetes mellitus     type 11     Past Surgical History  Procedure Laterality Date  . Appendectomy    . Cholecystectomy    . Abdominal hysterectomy    . Varicose vein surgery    . Foot arthroplasty      rt foot as child  . Upper gastrointestinal endoscopy    . Knee arthroscopy  04/20/2011    Procedure: ARTHROSCOPY  KNEE;  Surgeon: Hessie Dibble, MD;  Location: [place];  Service: Orthopedics;  Laterality: Right;  right knee arthroscopy partial medial menisectomy and chondroplasty.  . Total knee arthroplasty Right 12/11/2013    Procedure: TOTAL KNEE ARTHROPLASTY;  Surgeon: Hessie Dibble, MD;  Location: [place];  Service: Orthopedics;  Laterality: Right;  . Eye surgery      No prescriptions prior to admission   Allergies  Allergen Reactions  . Septra [Sulfamethoxazole W/Trimethoprim (Co-Trimoxazole)] Other (See Comments)    Ht rate up  . Codeine Other (See Comments)    hallucinate  . Erythromycin Other (See Comments)    abd pain  . Nsaids Nausea And Vomiting  . Tetracyclines & Related Itching    History  Substance Use Topics  . Smoking status: Former Research scientist (life sciences)  . Smokeless tobacco: Not on file  . Alcohol Use: Yes     Comment: rarely     No family history on file.   Review of Systems  Constitutional: Negative.   HENT: Negative.   Eyes: Negative.   Respiratory: Negative.   Cardiovascular: Negative.   Gastrointestinal: Negative.   Genitourinary: Negative.   Musculoskeletal: Positive for joint pain.  Skin: Negative.   Neurological: Negative.   Endo/Heme/Allergies: Negative.   Psychiatric/Behavioral: Negative.     Objective:  Physical Exam  Constitutional: She appears well-nourished.  HENT:  Head: Atraumatic.  Eyes: Pupils are equal, round, and reactive to light.  Neck: Normal range of motion.  Cardiovascular: Normal  rate.   Respiratory: Effort normal.  GI: Soft.  Musculoskeletal:  left knee exam: Range of motion 5-90.  Calf soft nontender negative Homans.  Severe pain medial joint line.  Walks with antalgic gait.  Neurological: She is alert.  Skin: Skin is warm.  Psychiatric: She has a normal mood and affect.    Vital signs in last 24 hours:    Labs:   Estimated body mass index is 44.40 kg/(m^2) as calculated from the following:   Height as of 12/11/13:  5' 9.5" (1.765 m).   Weight as of 12/04/13: 138.302 kg (304 lb 14.4 oz).   Imaging Review Plain radiographs demonstrate severe degenerative joint disease of the left knee(s). The overall alignment isneutral. The bone quality appears to be good for age and reported activity level.  Assessment/Plan:  End stage arthritis, left knee   The patient history, physical examination, clinical judgment of the provider and imaging studies are consistent with end stage degenerative joint disease of the left knee(s) and total knee arthroplasty is deemed medically necessary. The treatment options including medical management, injection therapy arthroscopy and arthroplasty were discussed at length. The risks and benefits of total knee arthroplasty were presented and reviewed. The risks due to aseptic loosening, infection, stiffness, patella tracking problems, thromboembolic complications and other imponderables were discussed. The patient acknowledged the explanation, agreed to proceed with the plan and consent was signed. Patient is being admitted for inpatient treatment for surgery, pain control, PT, OT, prophylactic antibiotics, VTE prophylaxis, progressive ambulation and ADL's and discharge planning. The patient is planning to be discharged home with home health services

## 2014-01-31 NOTE — Progress Notes (Signed)
Patient presents today for 3 month diabetes follow-up as part of the employer-sponsored [employer] program. Current diabetes regimen includes Metformin and cinnamon. Patient also continues on daily ASA and ACEi. Of note, patient underwent total knee replacement (right knee) on Sept 1st and plans to have left knee replaced on Oct 27th if approved by MD. She has been taking vicodin, methocarbamol, tramadol, and flexeril as needed for pain and spasm. She is tolerating these well and is attempting to use less as she heals. Patient has also had some difficulty with managing thyroid recently and is having labwork done to assess this.  Diabetes Assessment: Type of Diabetes: Type 2; Year of diagnosis 2010; [employer] classes [place]; uses glucometer; takes medications as prescribed; takes an aspirin a day; checks feet weekly; Sees Diabetes provider 2 times per year; MD managing Diabetes Dr. Theadore [facility]; What are the signs of hyperglycemia? Frequent Uriination; checks blood glucose 1-2 times a week; 7 day CBG average 101; 30 day CBG average 100; 14 day CBG average 100; Lowest CBG 88; Highest CBG 115 Other Diabetes History: Current med regimen includes Metformin ER 500 mg daily. Patient does maintain good medication compliance. Patient did bring meter today and is currently testing 1-2 times per week. Glucose monitoring occurs fasting and randomly. All glucose readings are between 90-115. Hypoglycemia frequency is rare. Patient does demonstrate appropriate correction of hypoglycemia. Patient denies signs and symptoms of neuropathy including numbness/tingling/burning and symptoms of foot infection. Patient is not due for yearly eye exam. A1c has improved today, and is 5.8 (previously 6.6). Patient is very pleased with this progress given her recent recovery from total knee replacement.  Lifestyle  Factors: Exercise - Physical therapy twice per week for TKR. In addition she uses  stationary bike once daily, 8 min forward and 2 min backward, and increases 1 min per week. She is also doing regular stretches to help with knee healing.  Diet - Patient has struggled in the past with food addiction but is working hard to make healthy choices and control portion sizes. She is cooking more at home and attempting to increase vegetable intake. She is ordering smaller meals at restaurants and is working to lose weight. She is has lost 10 lbs over the past 3 months.   Assessment: Patient presents today with improved A1c of 5.8 (prev 6.6). She is very pleased with this result. Patient has struggled with lifestyle in the past, but is making significant changes to both diet and exercise and these are paying off. She has lost 10 lbs over the past 3 months and hopes to lose another 10 lbs. Patient has set goal to maintain current changes and follow-up in three months.  Plan: 1) Continue exercising as much as tolerated. Consider purchasing weight lifting set for upper body strength 2) Continue to maintain healthy diet, great job with changes in this area 3) Continue testing as needed 4) A1c 5.8 5) Follow-up in 3 months on Thursday Jan 14th @ 11:00 am

## 2014-02-04 MED ORDER — LACTATED RINGERS IV SOLN
INTRAVENOUS | Status: DC
  Administered 2014-02-05 (×2): via INTRAVENOUS

## 2014-02-04 MED ORDER — DEXTROSE 5 % IV SOLN
3.0000 g | INTRAVENOUS | Status: AC
  Administered 2014-02-05: 3 g via INTRAVENOUS
  Filled 2014-02-04: qty 3000

## 2014-02-04 NOTE — Progress Notes (Signed)
Patient notified to arrive at 08:50

## 2014-02-05 ENCOUNTER — Inpatient Hospital Stay (HOSPITAL_COMMUNITY): Payer: 59 | Admitting: Anesthesiology

## 2014-02-05 ENCOUNTER — Encounter (HOSPITAL_COMMUNITY)
Admission: RE | Disposition: A | Discharge status: Home Health | Payer: Self-pay | Source: Ambulatory Visit | Attending: Orthopaedic Surgery

## 2014-02-05 ENCOUNTER — Inpatient Hospital Stay (HOSPITAL_COMMUNITY)
Admission: RE | Admit: 2014-02-05 | Discharge: 2014-02-08 | DRG: 470 | Disposition: A | Discharge status: Home Health | Payer: 59 | Source: Ambulatory Visit | Attending: Orthopaedic Surgery | Admitting: Orthopaedic Surgery

## 2014-02-05 ENCOUNTER — Encounter (HOSPITAL_COMMUNITY): Payer: 59 | Admitting: Anesthesiology

## 2014-02-05 ENCOUNTER — Encounter (HOSPITAL_COMMUNITY): Payer: Self-pay | Admitting: *Deleted

## 2014-02-05 DIAGNOSIS — K219 Gastro-esophageal reflux disease without esophagitis: Secondary | ICD-10-CM | POA: Diagnosis present

## 2014-02-05 DIAGNOSIS — E119 Type 2 diabetes mellitus without complications: Secondary | ICD-10-CM | POA: Diagnosis present

## 2014-02-05 DIAGNOSIS — I10 Essential (primary) hypertension: Secondary | ICD-10-CM | POA: Diagnosis present

## 2014-02-05 DIAGNOSIS — M1712 Unilateral primary osteoarthritis, left knee: Secondary | ICD-10-CM | POA: Diagnosis present

## 2014-02-05 DIAGNOSIS — Z79899 Other long term (current) drug therapy: Secondary | ICD-10-CM | POA: Diagnosis not present

## 2014-02-05 DIAGNOSIS — Z6841 Body Mass Index (BMI) 40.0 and over, adult: Secondary | ICD-10-CM

## 2014-02-05 DIAGNOSIS — Z87891 Personal history of nicotine dependence: Secondary | ICD-10-CM | POA: Diagnosis not present

## 2014-02-05 DIAGNOSIS — E039 Hypothyroidism, unspecified: Secondary | ICD-10-CM | POA: Diagnosis present

## 2014-02-05 DIAGNOSIS — Z96651 Presence of right artificial knee joint: Secondary | ICD-10-CM | POA: Diagnosis present

## 2014-02-05 DIAGNOSIS — Z7982 Long term (current) use of aspirin: Secondary | ICD-10-CM | POA: Diagnosis not present

## 2014-02-05 DIAGNOSIS — M25562 Pain in left knee: Secondary | ICD-10-CM | POA: Diagnosis present

## 2014-02-05 HISTORY — PX: TOTAL KNEE ARTHROPLASTY: SHX125

## 2014-02-05 LAB — TYPE AND SCREEN
ABO/RH(D): B POS
Antibody Screen: NEGATIVE

## 2014-02-05 LAB — GLUCOSE, CAPILLARY
Glucose-Capillary: 99 mg/dL (ref 70–99)
Glucose-Capillary: 81 mg/dL (ref 70–99)
Glucose-Capillary: 133 mg/dL — ABNORMAL HIGH (ref 70–99)
Glucose-Capillary: 140 mg/dL — ABNORMAL HIGH (ref 70–99)

## 2014-02-05 SURGERY — ARTHROPLASTY, KNEE, TOTAL
Anesthesia: Regional | Site: Knee | Laterality: Left

## 2014-02-05 MED ORDER — DOCUSATE SODIUM 100 MG PO CAPS
100.0000 mg | ORAL_CAPSULE | Freq: Two times a day (BID) | ORAL | Status: DC
  Administered 2014-02-05 – 2014-02-08 (×6): 100 mg via ORAL
  Filled 2014-02-05 (×8): qty 1

## 2014-02-05 MED ORDER — LABETALOL HCL 5 MG/ML IV SOLN
INTRAVENOUS | Status: AC
  Filled 2014-02-05: qty 4

## 2014-02-05 MED ORDER — HYDROMORPHONE HCL 1 MG/ML IJ SOLN
INTRAMUSCULAR | Status: AC
  Filled 2014-02-05: qty 1

## 2014-02-05 MED ORDER — ALBUTEROL SULFATE HFA 108 (90 BASE) MCG/ACT IN AERS: 2.0000 | INHALATION_SPRAY | Freq: Four times a day (QID) | RESPIRATORY_TRACT | Status: DC | PRN

## 2014-02-05 MED ORDER — MIDAZOLAM HCL 2 MG/2ML IJ SOLN
INTRAMUSCULAR | Status: AC
  Filled 2014-02-05: qty 2

## 2014-02-05 MED ORDER — HYDROCODONE-ACETAMINOPHEN 10-325 MG PO TABS: 1.0000 | ORAL_TABLET | ORAL | Status: DC | PRN

## 2014-02-05 MED ORDER — INSULIN ASPART 100 UNIT/ML ~~LOC~~ SOLN
0.0000 [IU] | Freq: Three times a day (TID) | SUBCUTANEOUS | Status: DC
  Administered 2014-02-06: 3 [IU] via SUBCUTANEOUS
  Administered 2014-02-06: 4 [IU] via SUBCUTANEOUS
  Administered 2014-02-07 – 2014-02-08 (×3): 3 [IU] via SUBCUTANEOUS

## 2014-02-05 MED ORDER — ONDANSETRON HCL 4 MG/2ML IJ SOLN
INTRAMUSCULAR | Status: DC | PRN
  Administered 2014-02-05: 4 mg via INTRAVENOUS

## 2014-02-05 MED ORDER — ROCURONIUM BROMIDE 50 MG/5ML IV SOLN
INTRAVENOUS | Status: AC
  Filled 2014-02-05: qty 1

## 2014-02-05 MED ORDER — ATORVASTATIN CALCIUM 10 MG PO TABS
10.0000 mg | ORAL_TABLET | Freq: Every evening | ORAL | Status: DC
  Administered 2014-02-05 – 2014-02-07 (×3): 10 mg via ORAL
  Filled 2014-02-05 (×4): qty 1

## 2014-02-05 MED ORDER — 0.9 % SODIUM CHLORIDE (POUR BTL) OPTIME
TOPICAL | Status: DC | PRN
  Administered 2014-02-05: 1000 mL

## 2014-02-05 MED ORDER — BISACODYL 5 MG PO TBEC: 5.0000 mg | DELAYED_RELEASE_TABLET | Freq: Every day | ORAL | Status: DC | PRN

## 2014-02-05 MED ORDER — TRANEXAMIC ACID 100 MG/ML IV SOLN
2000.0000 mg | INTRAVENOUS | Status: AC
  Administered 2014-02-05: 2000 mg via TOPICAL
  Filled 2014-02-05: qty 20

## 2014-02-05 MED ORDER — HYDROMORPHONE HCL 1 MG/ML IJ SOLN
0.2500 mg | INTRAMUSCULAR | Status: DC | PRN
  Administered 2014-02-05: 0.25 mg via INTRAVENOUS
  Administered 2014-02-05: 0.5 mg via INTRAVENOUS
  Administered 2014-02-05: 0.25 mg via INTRAVENOUS

## 2014-02-05 MED ORDER — METFORMIN HCL ER 500 MG PO TB24
500.0000 mg | ORAL_TABLET | Freq: Every evening | ORAL | Status: DC
  Administered 2014-02-05 – 2014-02-07 (×3): 500 mg via ORAL
  Filled 2014-02-05 (×4): qty 1

## 2014-02-05 MED ORDER — PANTOPRAZOLE SODIUM 40 MG PO TBEC
40.0000 mg | DELAYED_RELEASE_TABLET | Freq: Every evening | ORAL | Status: DC
  Administered 2014-02-05 – 2014-02-07 (×3): 40 mg via ORAL
  Filled 2014-02-05 (×3): qty 1

## 2014-02-05 MED ORDER — DILTIAZEM HCL ER COATED BEADS 120 MG PO TB24: 120.0000 mg | ORAL_TABLET | Freq: Every evening | ORAL | Status: DC

## 2014-02-05 MED ORDER — CYCLOBENZAPRINE HCL 10 MG PO TABS: 10.0000 mg | ORAL_TABLET | Freq: Four times a day (QID) | ORAL | Status: DC | PRN

## 2014-02-05 MED ORDER — CHROMIUM-CINNAMON 50-500 MCG-MG PO CAPS: 2.0000 | ORAL_CAPSULE | Freq: Every day | ORAL | Status: DC

## 2014-02-05 MED ORDER — DIPHENHYDRAMINE HCL 12.5 MG/5ML PO ELIX: 12.5000 mg | ORAL_SOLUTION | ORAL | Status: DC | PRN

## 2014-02-05 MED ORDER — LEVOTHYROXINE SODIUM 175 MCG PO TABS
175.0000 ug | ORAL_TABLET | ORAL | Status: DC
  Administered 2014-02-06 – 2014-02-08 (×3): 175 ug via ORAL
  Filled 2014-02-05 (×4): qty 1

## 2014-02-05 MED ORDER — MENTHOL 3 MG MT LOZG: 1.0000 | LOZENGE | OROMUCOSAL | Status: DC | PRN

## 2014-02-05 MED ORDER — MIDAZOLAM HCL 5 MG/5ML IJ SOLN
INTRAMUSCULAR | Status: DC | PRN
  Administered 2014-02-05: 2 mg via INTRAVENOUS

## 2014-02-05 MED ORDER — ONDANSETRON HCL 4 MG PO TABS: 4.0000 mg | ORAL_TABLET | Freq: Four times a day (QID) | ORAL | Status: DC | PRN

## 2014-02-05 MED ORDER — ACETAMINOPHEN 650 MG RE SUPP: 650.0000 mg | Freq: Four times a day (QID) | RECTAL | Status: DC | PRN

## 2014-02-05 MED ORDER — FENTANYL CITRATE 0.05 MG/ML IJ SOLN
INTRAMUSCULAR | Status: AC
  Filled 2014-02-05: qty 5

## 2014-02-05 MED ORDER — ASPIRIN EC 325 MG PO TBEC
325.0000 mg | DELAYED_RELEASE_TABLET | Freq: Two times a day (BID) | ORAL | Status: DC
  Administered 2014-02-06 – 2014-02-08 (×5): 325 mg via ORAL
  Filled 2014-02-05 (×7): qty 1

## 2014-02-05 MED ORDER — LIDOCAINE HCL (CARDIAC) 20 MG/ML IV SOLN
INTRAVENOUS | Status: AC
  Filled 2014-02-05: qty 5

## 2014-02-05 MED ORDER — HYDROCHLOROTHIAZIDE 12.5 MG PO CAPS
12.5000 mg | ORAL_CAPSULE | Freq: Every evening | ORAL | Status: DC
  Administered 2014-02-05 – 2014-02-07 (×3): 12.5 mg via ORAL
  Filled 2014-02-05 (×4): qty 1

## 2014-02-05 MED ORDER — FENTANYL CITRATE 0.05 MG/ML IJ SOLN
INTRAMUSCULAR | Status: AC
  Administered 2014-02-05: 100 ug
  Filled 2014-02-05: qty 2

## 2014-02-05 MED ORDER — HYDROMORPHONE HCL 1 MG/ML IJ SOLN
0.5000 mg | INTRAMUSCULAR | Status: DC | PRN
  Administered 2014-02-05 – 2014-02-07 (×9): 1 mg via INTRAVENOUS
  Filled 2014-02-05 (×10): qty 1

## 2014-02-05 MED ORDER — ONDANSETRON HCL 4 MG/2ML IJ SOLN
INTRAMUSCULAR | Status: AC
  Filled 2014-02-05: qty 2

## 2014-02-05 MED ORDER — HYDROCODONE-ACETAMINOPHEN 10-325 MG PO TABS
1.0000 | ORAL_TABLET | Freq: Four times a day (QID) | ORAL | Status: DC | PRN
  Administered 2014-02-05 – 2014-02-08 (×11): 2 via ORAL
  Filled 2014-02-05 (×11): qty 2

## 2014-02-05 MED ORDER — METHOCARBAMOL 500 MG PO TABS
ORAL_TABLET | ORAL | Status: AC
  Filled 2014-02-05: qty 2

## 2014-02-05 MED ORDER — LEVOTHYROXINE SODIUM 175 MCG PO TABS: 87.5000 ug | ORAL_TABLET | ORAL | Status: DC

## 2014-02-05 MED ORDER — ACETAMINOPHEN 325 MG PO TABS: 650.0000 mg | ORAL_TABLET | Freq: Four times a day (QID) | ORAL | Status: DC | PRN

## 2014-02-05 MED ORDER — METOCLOPRAMIDE HCL 10 MG PO TABS: 5.0000 mg | ORAL_TABLET | Freq: Three times a day (TID) | ORAL | Status: DC | PRN

## 2014-02-05 MED ORDER — MIDAZOLAM HCL 2 MG/2ML IJ SOLN
INTRAMUSCULAR | Status: AC
  Administered 2014-02-05: 2 mg
  Filled 2014-02-05: qty 2

## 2014-02-05 MED ORDER — DICYCLOMINE HCL 10 MG PO CAPS: 10.0000 mg | ORAL_CAPSULE | Freq: Four times a day (QID) | ORAL | Status: DC | PRN

## 2014-02-05 MED ORDER — CHLORHEXIDINE GLUCONATE 4 % EX LIQD
60.0000 mL | Freq: Once | CUTANEOUS | Status: DC
  Filled 2014-02-05: qty 60

## 2014-02-05 MED ORDER — FENTANYL CITRATE 0.05 MG/ML IJ SOLN: 100.0000 ug | Freq: Once | INTRAMUSCULAR | Status: DC

## 2014-02-05 MED ORDER — FENTANYL CITRATE 0.05 MG/ML IJ SOLN
INTRAMUSCULAR | Status: DC | PRN
  Administered 2014-02-05: 100 ug via INTRAVENOUS
  Administered 2014-02-05: 250 ug via INTRAVENOUS
  Administered 2014-02-05 (×3): 50 ug via INTRAVENOUS

## 2014-02-05 MED ORDER — ROPIVACAINE HCL 5 MG/ML IJ SOLN
INTRAMUSCULAR | Status: DC | PRN
  Administered 2014-02-05: 30 mL via PERINEURAL

## 2014-02-05 MED ORDER — BIOTIN 5000 MCG PO CAPS: 5000.0000 ug | ORAL_CAPSULE | Freq: Every day | ORAL | Status: DC

## 2014-02-05 MED ORDER — LISINOPRIL 2.5 MG PO TABS
2.5000 mg | ORAL_TABLET | Freq: Every evening | ORAL | Status: DC
  Administered 2014-02-05 – 2014-02-07 (×3): 2.5 mg via ORAL
  Filled 2014-02-05 (×4): qty 1

## 2014-02-05 MED ORDER — LORATADINE 10 MG PO TABS
10.0000 mg | ORAL_TABLET | Freq: Every day | ORAL | Status: DC
  Administered 2014-02-05 – 2014-02-07 (×3): 10 mg via ORAL
  Filled 2014-02-05 (×4): qty 1

## 2014-02-05 MED ORDER — VITAMIN D-3 125 MCG (5000 UT) PO TABS: 5000.0000 [IU] | ORAL_TABLET | Freq: Every evening | ORAL | Status: DC

## 2014-02-05 MED ORDER — VALACYCLOVIR HCL 500 MG PO TABS
500.0000 mg | ORAL_TABLET | Freq: Every evening | ORAL | Status: DC
  Administered 2014-02-05 – 2014-02-07 (×3): 500 mg via ORAL
  Filled 2014-02-05 (×4): qty 1

## 2014-02-05 MED ORDER — PROPOFOL 10 MG/ML IV BOLUS
INTRAVENOUS | Status: DC | PRN
  Administered 2014-02-05: 200 mg via INTRAVENOUS

## 2014-02-05 MED ORDER — CEFAZOLIN SODIUM-DEXTROSE 2-3 GM-% IV SOLR
2.0000 g | Freq: Four times a day (QID) | INTRAVENOUS | Status: AC
  Administered 2014-02-05 – 2014-02-06 (×2): 2 g via INTRAVENOUS
  Filled 2014-02-05 (×2): qty 50

## 2014-02-05 MED ORDER — PROPOFOL 10 MG/ML IV BOLUS
INTRAVENOUS | Status: AC
  Filled 2014-02-05: qty 20

## 2014-02-05 MED ORDER — THERAPEUTIC MULTIVIT/MINERAL PO TABS: 1.0000 | ORAL_TABLET | Freq: Every day | ORAL | Status: DC

## 2014-02-05 MED ORDER — PHENOL 1.4 % MT LIQD: 1.0000 | OROMUCOSAL | Status: DC | PRN

## 2014-02-05 MED ORDER — ALUM & MAG HYDROXIDE-SIMETH 200-200-20 MG/5ML PO SUSP: 30.0000 mL | ORAL | Status: DC | PRN

## 2014-02-05 MED ORDER — MIDAZOLAM HCL 2 MG/2ML IJ SOLN: 2.0000 mg | Freq: Once | INTRAMUSCULAR | Status: DC

## 2014-02-05 MED ORDER — LABETALOL HCL 5 MG/ML IV SOLN
INTRAVENOUS | Status: DC | PRN
  Administered 2014-02-05: 10 mg via INTRAVENOUS

## 2014-02-05 MED ORDER — FLUTICASONE PROPIONATE 50 MCG/ACT NA SUSP
2.0000 | Freq: Every day | NASAL | Status: DC | PRN
  Filled 2014-02-05: qty 16

## 2014-02-05 MED ORDER — ONDANSETRON HCL 4 MG/2ML IJ SOLN: 4.0000 mg | Freq: Four times a day (QID) | INTRAMUSCULAR | Status: DC | PRN

## 2014-02-05 MED ORDER — LACTATED RINGERS IV SOLN
INTRAVENOUS | Status: DC
  Administered 2014-02-05: 22:00:00 via INTRAVENOUS

## 2014-02-05 MED ORDER — METOCLOPRAMIDE HCL 5 MG/ML IJ SOLN: 5.0000 mg | Freq: Three times a day (TID) | INTRAMUSCULAR | Status: DC | PRN

## 2014-02-05 MED ORDER — LORAZEPAM 0.5 MG PO TABS: 0.2500 mg | ORAL_TABLET | Freq: Two times a day (BID) | ORAL | Status: DC | PRN

## 2014-02-05 MED ORDER — DILTIAZEM HCL ER COATED BEADS 120 MG PO CP24
120.0000 mg | ORAL_CAPSULE | Freq: Every evening | ORAL | Status: DC
  Administered 2014-02-05 – 2014-02-07 (×3): 120 mg via ORAL
  Filled 2014-02-05 (×4): qty 1

## 2014-02-05 MED ORDER — METHOCARBAMOL 750 MG PO TABS
750.0000 mg | ORAL_TABLET | Freq: Four times a day (QID) | ORAL | Status: DC | PRN
  Administered 2014-02-05 – 2014-02-08 (×12): 750 mg via ORAL
  Filled 2014-02-05 (×12): qty 1

## 2014-02-05 MED ORDER — VITAMIN D3 25 MCG (1000 UNIT) PO TABS
5000.0000 [IU] | ORAL_TABLET | Freq: Every day | ORAL | Status: DC
  Administered 2014-02-06 – 2014-02-08 (×3): 5000 [IU] via ORAL
  Filled 2014-02-05 (×3): qty 5

## 2014-02-05 MED ORDER — TRANEXAMIC ACID 100 MG/ML IV SOLN
1000.0000 mg | INTRAVENOUS | Status: DC
  Filled 2014-02-05: qty 10

## 2014-02-05 MED ORDER — LEVOTHYROXINE SODIUM 175 MCG PO TABS: 87.5000 ug | ORAL_TABLET | Freq: Every day | ORAL | Status: DC

## 2014-02-05 MED ORDER — BUPIVACAINE LIPOSOME 1.3 % IJ SUSP
20.0000 mL | Freq: Once | INTRAMUSCULAR | Status: AC
  Administered 2014-02-05: 20 mL
  Filled 2014-02-05: qty 20

## 2014-02-05 MED ORDER — SODIUM CHLORIDE 0.9 % IR SOLN
Status: DC | PRN
  Administered 2014-02-05 (×2): 1000 mL

## 2014-02-05 MED ORDER — LIDOCAINE HCL (CARDIAC) 20 MG/ML IV SOLN
INTRAVENOUS | Status: DC | PRN
Start: 2014-02-05 — End: 2014-02-05
  Administered 2014-02-05: 40 mg via INTRAVENOUS

## 2014-02-05 MED ORDER — ADULT MULTIVITAMIN W/MINERALS CH
1.0000 | ORAL_TABLET | Freq: Every day | ORAL | Status: DC
  Administered 2014-02-06 – 2014-02-08 (×3): 1 via ORAL
  Filled 2014-02-05 (×3): qty 1

## 2014-02-05 SURGICAL SUPPLY — 73 items
BAG DECANTER FOR FLEXI CONT (MISCELLANEOUS) ×3 IMPLANT
BANDAGE ELASTIC 4 VELCRO ST LF (GAUZE/BANDAGES/DRESSINGS) ×3 IMPLANT
BANDAGE ESMARK 6X9 LF (GAUZE/BANDAGES/DRESSINGS) ×1 IMPLANT
BENZOIN TINCTURE PRP APPL 2/3 (GAUZE/BANDAGES/DRESSINGS) ×3 IMPLANT
BLADE SAG 18X100X1.27 (BLADE) IMPLANT
BLADE SAGITTAL 13X1.27X60 (BLADE) IMPLANT
BLADE SAGITTAL 13X1.27X60MM (BLADE)
BLADE SAGITTAL 25.0X1.19X90 (BLADE) ×2 IMPLANT
BLADE SAGITTAL 25.0X1.19X90MM (BLADE) ×1
BLADE SURG ROTATE 9660 (MISCELLANEOUS) IMPLANT
BNDG ELASTIC 6X10 VLCR STRL LF (GAUZE/BANDAGES/DRESSINGS) ×3 IMPLANT
BNDG ESMARK 6X9 LF (GAUZE/BANDAGES/DRESSINGS) ×3
BNDG GAUZE ELAST 4 BULKY (GAUZE/BANDAGES/DRESSINGS) ×3 IMPLANT
BOWL SMART MIX CTS (DISPOSABLE) ×3 IMPLANT
CAP UPCHARGE REVISION TRAY ×3 IMPLANT
CAPT RP KNEE ×3 IMPLANT
CEMENT HV SMART SET (Cement) ×6 IMPLANT
CLOSURE WOUND 1/2 X4 (GAUZE/BANDAGES/DRESSINGS) ×1
COVER SURGICAL LIGHT HANDLE (MISCELLANEOUS) ×3 IMPLANT
CUFF TOURNIQUET SINGLE 34IN LL (TOURNIQUET CUFF) IMPLANT
CUFF TOURNIQUET SINGLE 44IN (TOURNIQUET CUFF) IMPLANT
DRAPE EXTREMITY T 121X128X90 (DRAPE) ×3 IMPLANT
DRAPE PROXIMA HALF (DRAPES) ×3 IMPLANT
DRAPE U-SHAPE 47X51 STRL (DRAPES) ×3 IMPLANT
DRSG ADAPTIC 3X8 NADH LF (GAUZE/BANDAGES/DRESSINGS) ×3 IMPLANT
DRSG PAD ABDOMINAL 8X10 ST (GAUZE/BANDAGES/DRESSINGS) ×3 IMPLANT
DURAPREP 26ML APPLICATOR (WOUND CARE) ×3 IMPLANT
ELECT REM PT RETURN 9FT ADLT (ELECTROSURGICAL) ×3
ELECTRODE REM PT RTRN 9FT ADLT (ELECTROSURGICAL) ×1 IMPLANT
GAUZE SPONGE 4X4 12PLY STRL (GAUZE/BANDAGES/DRESSINGS) ×3 IMPLANT
GLOVE BIO SURGEON STRL SZ7 (GLOVE) ×3 IMPLANT
GLOVE BIO SURGEON STRL SZ8 (GLOVE) ×6 IMPLANT
GLOVE BIOGEL PI IND STRL 7.0 (GLOVE) ×2 IMPLANT
GLOVE BIOGEL PI IND STRL 7.5 (GLOVE) ×2 IMPLANT
GLOVE BIOGEL PI IND STRL 8 (GLOVE) ×2 IMPLANT
GLOVE BIOGEL PI INDICATOR 7.0 (GLOVE) ×4
GLOVE BIOGEL PI INDICATOR 7.5 (GLOVE) ×4
GLOVE BIOGEL PI INDICATOR 8 (GLOVE) ×4
GLOVE ECLIPSE 6.5 STRL STRAW (GLOVE) ×6 IMPLANT
GLOVE SURG SS PI 6.5 STRL IVOR (GLOVE) ×3 IMPLANT
GOWN STRL REUS W/ TWL LRG LVL3 (GOWN DISPOSABLE) ×3 IMPLANT
GOWN STRL REUS W/ TWL XL LVL3 (GOWN DISPOSABLE) ×2 IMPLANT
GOWN STRL REUS W/TWL LRG LVL3 (GOWN DISPOSABLE) ×6
GOWN STRL REUS W/TWL XL LVL3 (GOWN DISPOSABLE) ×4
HANDPIECE INTERPULSE COAX TIP (DISPOSABLE) ×2
HOOD PEEL AWAY FACE SHEILD DIS (HOOD) ×6 IMPLANT
IMMOBILIZER KNEE 20 (SOFTGOODS) IMPLANT
IMMOBILIZER KNEE 22 UNIV (SOFTGOODS) ×3 IMPLANT
IMMOBILIZER KNEE 24 THIGH 36 (MISCELLANEOUS) IMPLANT
IMMOBILIZER KNEE 24 UNIV (MISCELLANEOUS)
KIT BASIN OR (CUSTOM PROCEDURE TRAY) ×3 IMPLANT
KIT ROOM TURNOVER OR (KITS) ×3 IMPLANT
MANIFOLD NEPTUNE II (INSTRUMENTS) ×3 IMPLANT
NEEDLE 18GX1X1/2 (RX/OR ONLY) (NEEDLE) ×3 IMPLANT
NEEDLE HYPO 21X1 ECLIPSE (NEEDLE) IMPLANT
NS IRRIG 1000ML POUR BTL (IV SOLUTION) ×3 IMPLANT
PACK TOTAL JOINT (CUSTOM PROCEDURE TRAY) ×3 IMPLANT
PAD ARMBOARD 7.5X6 YLW CONV (MISCELLANEOUS) ×6 IMPLANT
SET HNDPC FAN SPRY TIP SCT (DISPOSABLE) ×1 IMPLANT
STAPLER VISISTAT 35W (STAPLE) IMPLANT
STRIP CLOSURE SKIN 1/2X4 (GAUZE/BANDAGES/DRESSINGS) ×2 IMPLANT
SUCTION FRAZIER TIP 10 FR DISP (SUCTIONS) ×3 IMPLANT
SUT MNCRL AB 3-0 PS2 18 (SUTURE) ×3 IMPLANT
SUT VIC AB 0 CT1 27 (SUTURE) ×4
SUT VIC AB 0 CT1 27XBRD ANBCTR (SUTURE) ×2 IMPLANT
SUT VIC AB 2-0 CT1 27 (SUTURE) ×4
SUT VIC AB 2-0 CT1 TAPERPNT 27 (SUTURE) ×2 IMPLANT
SUT VLOC 180 0 24IN GS25 (SUTURE) ×3 IMPLANT
SYR 50ML LL SCALE MARK (SYRINGE) ×3 IMPLANT
SYR CONTROL 10ML LL (SYRINGE) ×3 IMPLANT
TOWEL OR 17X24 6PK STRL BLUE (TOWEL DISPOSABLE) ×3 IMPLANT
TOWEL OR 17X26 10 PK STRL BLUE (TOWEL DISPOSABLE) ×3 IMPLANT
TRAY FOLEY CATH 14FR (SET/KITS/TRAYS/PACK) ×3 IMPLANT

## 2014-02-05 NOTE — Progress Notes (Signed)
Orthopedic Tech Progress Note Patient Details:  Melanie Hood February 19, 1956 [id number]  CPM Left Knee CPM Left Knee: On Left Knee Flexion (Degrees): 90 Left Knee Extension (Degrees): 0   [name] 02/05/2014, 8:23 PM

## 2014-02-05 NOTE — Anesthesia Postprocedure Evaluation (Signed)
  Anesthesia Post-op Note  Patient: Melanie Hood  Procedure(s) Performed: Procedure(s): LEFT TOTAL KNEE ARTHROPLASTY (Left)  Patient Location: PACU  Anesthesia Type:General and block  Level of Consciousness: awake and alert   Airway and Oxygen Therapy: Patient Spontanous Breathing  Post-op Pain: mild  Post-op Assessment: Post-op Vital signs reviewed, Patient's Cardiovascular Status Stable and Respiratory Function Stable  Post-op Vital Signs: Reviewed  Filed Vitals:   02/05/14 1445  BP: 135/75  Pulse: 72  Temp:   Resp: 16    Complications: No apparent anesthesia complications

## 2014-02-05 NOTE — Plan of Care (Signed)
Problem: Consults Goal: Diagnosis- Total Joint Replacement Primary Total Knee Left     

## 2014-02-05 NOTE — Op Note (Signed)
PREOP DIAGNOSIS: DJD LEFT KNEE POSTOP DIAGNOSIS:  same PROCEDURE: LEFT TKR ANESTHESIA: General and block ATTENDING SURGEON: Tyris Eliot G ASSISTANTLoni Dolly PA  INDICATIONS FOR PROCEDURE: Melanie Hood is a 58 y.o. female who has struggled for a long time with pain due to degenerative arthritis of the left knee.  The patient has failed many conservative non-operative measures and at this point has pain which limits the ability to sleep and walk.  The patient is offered total knee replacement.  Informed operative consent was obtained after discussion of possible risks of anesthesia, infection, neurovascular injury, DVT, and death.  The importance of the post-operative rehabilitation protocol to optimize result was stressed extensively with the patient.  SUMMARY OF FINDINGS AND PROCEDURE:  Melanie Hood was taken to the operative suite where under the above anesthesia a left knee replacement was performed.  There were advanced degenerative changes and the bone quality was good.  We used the DePuyLCS system and placed size standard plus femur, 4 MBT tibia, 38 mm all polyethylene patella, and a size 12.5 mm spacer.  Loni Dolly PA-C assisted throughout and was invaluable to the completion of the case in that he helped retract and maintain exposure while I placed the components.  He also helped close thereby minimizing OR time.  The patient was admitted for appropriate post-op care to include perioperative antibiotics and mechanical and pharmacologic measures for DVT prophylaxis.  DESCRIPTION OF PROCEDURE:  Melanie Hood was taken to the operative suite where the above anesthesia was applied.  The patient was positioned supine and prepped and draped in normal sterile fashion.  An appropriate time out was performed.  After the administration of kefzol pre-op antibiotic the leg was elevated and exsanguinated and a tourniquet inflated.  A standard longitudinal incision was made on the anterior knee.   Dissection was carried down to the extensor mechanism.  All appropriate anti-infective measures were used including the pre-operative antibiotic, betadine impregnated drape, and closed hooded exhaust systems for each member of the surgical team.  A medial parapatellar incision was made in the extensor mechanism and the knee cap flipped and the knee flexed.  Some residual meniscal tissues were removed along with any remaining ACL/PCL tissue.  A guide was placed on the tibia and a flat cut was made on it's superior surface.  An intramedullary guide was placed in the femur and was utilized to make anterior and posterior cuts creating an appropriate flexion gap.  A second intramedullary guide was placed in the femur to make a distal cut properly balancing the knee with an extension gap equal to the flexion gap.  The three bones sized to the above mentioned sizes and the appropriate guides were placed and utilized.  A trial reduction was done and the knee easily came to full extension and the patella tracked well on flexion.  The trial components were removed and all bones were cleaned with pulsatile lavage and then dried thoroughly.  Cement was mixed and was pressurized onto the bones followed by placement of the aforementioned components.  Excess cement was trimmed and pressure was held on the components until the cement had hardened.  The tourniquet was deflated and a small amount of bleeding was controlled with cautery and pressure.  The knee was irrigated thoroughly.  The extensor mechanism was re-approximated with V-loc suture in running fashion.  The knee was flexed and the repair was solid.  The subcutaneous tissues were re-approximated with #0 and #2-0 vicryl and the  skin closed with a subcuticular stitch and steristrips.  A sterile dressing was applied.  Intraoperative fluids, EBL, and tourniquet time can be obtained from anesthesia records.  DISPOSITION:  The patient was taken to recovery room in stable  condition and admitted for appropriate post-op care to include peri-operative antibiotic and DVT prophylaxis with mechanical and pharmacologic measures.  Masayuki Sakai G 02/05/2014, 1:10 PM

## 2014-02-05 NOTE — Transfer of Care (Signed)
Immediate Anesthesia Transfer of Care Note  Patient: Melanie Hood  Procedure(s) Performed: Procedure(s): LEFT TOTAL KNEE ARTHROPLASTY (Left)  Patient Location: PACU  Anesthesia Type:GA combined with regional for post-op pain  Level of Consciousness: awake, alert  and oriented  Airway & Oxygen Therapy: Patient Spontanous Breathing and Patient connected to nasal cannula oxygen  Post-op Assessment: Report given to PACU RN and Post -op Vital signs reviewed and stable  Post vital signs: Reviewed and stable  Complications: No apparent anesthesia complications

## 2014-02-05 NOTE — Anesthesia Preprocedure Evaluation (Addendum)
Anesthesia Evaluation  Patient identified by MRN, date of birth, ID band Patient awake    Reviewed: Allergy & Precautions, H&P , NPO status , Patient's Chart, lab work & pertinent test results  Airway Mallampati: II  TM Distance: >3 FB Neck ROM: Full    Dental no notable dental hx. (+) Teeth Intact, Dental Advisory Given   Pulmonary sleep apnea and Continuous Positive Airway Pressure Ventilation , former smoker,  breath sounds clear to auscultation  Pulmonary exam normal       Cardiovascular hypertension, Pt. on medications Rhythm:Regular Rate:Normal     Neuro/Psych negative neurological ROS  negative psych ROS   GI/Hepatic Neg liver ROS, GERD-  Medicated and Controlled,  Endo/Other  diabetes, Type 2, Oral Hypoglycemic AgentsHypothyroidism Morbid obesity  Renal/GU negative Renal ROS  negative genitourinary   Musculoskeletal  (+) Arthritis -, Osteoarthritis,    Abdominal   Peds  Hematology negative hematology ROS (+)   Anesthesia Other Findings   Reproductive/Obstetrics negative OB ROS                          Anesthesia Physical Anesthesia Plan  ASA: III  Anesthesia Plan: General and Regional   Post-op Pain Management:    Induction: Intravenous  Airway Management Planned: LMA  Additional Equipment:   Intra-op Plan:   Post-operative Plan: Extubation in OR  Informed Consent: I have reviewed the patients History and Physical, chart, labs and discussed the procedure including the risks, benefits and alternatives for the proposed anesthesia with the patient or authorized representative who has indicated his/her understanding and acceptance.   Dental advisory given  Plan Discussed with: CRNA  Anesthesia Plan Comments:         Anesthesia Quick Evaluation

## 2014-02-05 NOTE — Anesthesia Procedure Notes (Addendum)
Anesthesia Regional Block:  Adductor canal block  Pre-Anesthetic Checklist: ,, timeout performed, Correct Patient, Correct Site, Correct Laterality, Correct Procedure, Correct Position, site marked, Risks and benefits discussed, pre-op evaluation,  At surgeon's request and post-op pain management  Laterality: Left  Prep: Maximum Sterile Barrier Precautions used and chloraprep       Needles:  Injection technique: Single-shot  Needle Type: Echogenic Stimulator Needle     Needle Length: 5cm 5 cm Needle Gauge: 22 and 22 G    Additional Needles:  Procedures: ultrasound guided (picture in chart) Adductor canal block Narrative:  Start time: 02/05/2014 10:10 AM End time: 02/05/2014 10:22 AM Injection made incrementally with aspirations every 5 mL. Anesthesiologist: Fitzgerald,MD  Additional Notes: 2% Lidocaine skin wheel.    Procedure Name: LMA Insertion Date/Time: 02/05/2014 11:35 AM Performed by: Mariea Clonts Pre-anesthesia Checklist: Emergency Drugs available, Timeout performed, Patient identified, Patient being monitored and Suction available Patient Re-evaluated:Patient Re-evaluated prior to inductionOxygen Delivery Method: Circle system utilized Preoxygenation: Pre-oxygenation with 100% oxygen Intubation Type: IV induction LMA: LMA inserted LMA Size: 4.0 Number of attempts: 1 Placement Confirmation: breath sounds checked- equal and bilateral and positive ETCO2 Tube secured with: Tape Dental Injury: Teeth and Oropharynx as per pre-operative assessment

## 2014-02-05 NOTE — Interval H&P Note (Signed)
History and Physical Interval Note:  02/05/2014 11:19 AM  Melanie Hood  has presented today for surgery, with the diagnosis of LEFT KNEE DEGENERATIVE JOINT DISEASE  The various methods of treatment have been discussed with the patient and family. After consideration of risks, benefits and other options for treatment, the patient has consented to  Procedure(s): LEFT TOTAL KNEE ARTHROPLASTY (Left) as a surgical intervention .  The patient's history has been reviewed, patient examined, no change in status, stable for surgery.  I have reviewed the patient's chart and labs.  Questions were answered to the patient's satisfaction.     Corrinna Karapetyan G

## 2014-02-06 LAB — CBC
HCT: 30.7 % — ABNORMAL LOW (ref 36.0–46.0)
Hemoglobin: 9.8 g/dL — ABNORMAL LOW (ref 12.0–15.0)
MCH: 28.6 pg (ref 26.0–34.0)
MCHC: 31.9 g/dL (ref 30.0–36.0)
MCV: 89.5 fL (ref 78.0–100.0)
Platelets: 293 10*3/uL (ref 150–400)
RBC: 3.43 MIL/uL — ABNORMAL LOW (ref 3.87–5.11)
RDW: 14.6 % (ref 11.5–15.5)
WBC: 10.3 10*3/uL (ref 4.0–10.5)

## 2014-02-06 LAB — BASIC METABOLIC PANEL
Anion gap: 14 (ref 5–15)
BUN: 11 mg/dL (ref 6–23)
CO2: 24 mEq/L (ref 19–32)
Calcium: 8.7 mg/dL (ref 8.4–10.5)
Chloride: 99 mEq/L (ref 96–112)
Creatinine, Ser: 0.83 mg/dL (ref 0.50–1.10)
GFR calc Af Amer: 88 mL/min — ABNORMAL LOW (ref >90)
GFR calc non Af Amer: 76 mL/min — ABNORMAL LOW (ref >90)
Glucose, Bld: 151 mg/dL — ABNORMAL HIGH (ref 70–99)
Potassium: 3.4 mEq/L — ABNORMAL LOW (ref 3.7–5.3)
Sodium: 137 mEq/L (ref 137–147)

## 2014-02-06 LAB — GLUCOSE, CAPILLARY
GLUCOSE-CAPILLARY: 156 mg/dL — AB (ref 70–99)
GLUCOSE-CAPILLARY: 139 mg/dL — AB (ref 70–99)
Glucose-Capillary: 139 mg/dL — ABNORMAL HIGH (ref 70–99)
Glucose-Capillary: 134 mg/dL — ABNORMAL HIGH (ref 70–99)

## 2014-02-06 NOTE — Plan of Care (Signed)
Problem: Consults Goal: Diagnosis- Total Joint Replacement Primary Total Hip     

## 2014-02-06 NOTE — Evaluation (Signed)
Physical Therapy Evaluation Patient Details Name: Melanie Hood MRN: [id number] DOB: [birth year] Today's Date: 02/06/2014   History of Present Illness  Pt presents for left TKA. Had right TKA 12/13/13. H/o morbid obesity.  Clinical Impression  Pt admitted with left knee TKA following right 6 wks ago. Pt currently with functional limitations due to the deficits listed below (see PT Problem List). Pt ambulated 5' to chair with min A.  Pt will benefit from skilled PT to increase their independence and safety with mobility to allow discharge to the venue listed below. PT will continue to follow.       Follow Up Recommendations Home health PT;Supervision/Assistance - 24 hour    Equipment Recommendations  None recommended by PT    Recommendations for Other Services       Precautions / Restrictions Precautions Precautions: Knee Required Braces or Orthoses: Knee Immobilizer - Left Restrictions Weight Bearing Restrictions: Yes LLE Weight Bearing: Weight bearing as tolerated      Mobility  Bed Mobility Overal bed mobility: Needs Assistance Bed Mobility: Supine to Sit     Supine to sit: Min assist     General bed mobility comments: began assisting pt with left leg but pt requested her belt that she uses at home and hooked around her left foot so she could guide LLE off bed. Did this very effectively. Also used to reposition once sitting EOB  Transfers Overall transfer level: Needs assistance Equipment used: Rolling walker (2 wheeled) Transfers: Sit to/from Stand Sit to Stand: Min assist         General transfer comment: RW stabilized by therapist, min A to steady as pt brought hands to RW  Ambulation/Gait Ambulation/Gait assistance: Min guard Ambulation Distance (Feet): 5 Feet Assistive device: Rolling walker (2 wheeled) Gait Pattern/deviations: Step-to pattern;Decreased stance time - left;Decreased weight shift to left Gait velocity: decreased   General Gait Details:  vc's for sequencing and staying close to [employer] Mobility    Modified Rankin (Stroke Patients Only)       Balance Overall balance assessment: Needs assistance Sitting-balance support: No upper extremity supported Sitting balance-Leahy Scale: Normal     Standing balance support: Bilateral upper extremity supported;During functional activity Standing balance-Leahy Scale: Poor Standing balance comment: requires UE support today due to knee pain                             Pertinent Vitals/Pain Pain Assessment: 0-10 Pain Score: 10-Worst pain ever Pain Location: left knee Pain Intervention(s): Premedicated before session;Patient requesting pain meds-RN notified    Home Living Family/patient expects to be discharged to:: Private residence Living Arrangements: Children Available Help at Discharge: Family;Available 24 hours/day Type of Home: House Home Access: Stairs to enter   [employer] of Steps: 1 Home Layout: Two level;Able to live on main level with bedroom/bathroom Home Equipment: Kasandra Knudsen - single point;Walker - 2 wheels Additional Comments: pt's daughter came to stay with her last time and plans to do so again    Prior Function Level of Independence: Independent         Comments: since last TKA, had progressed to ambulation without AD and returned to independence     Hand Dominance   Dominant Hand: Right    Extremity/Trunk Assessment   Upper Extremity Assessment: Overall WFL for tasks assessed  Lower Extremity Assessment: RLE deficits/detail;LLE deficits/detail RLE Deficits / Details: pt with keloid scar right knee, strength at least 4/5 throughout, lacking 5 degrees extension, flexion >90 degrees LLE Deficits / Details: hip flex 2/5, knee ext 2/5, ankle WFL  Cervical / Trunk Assessment: Normal  Communication   Communication: No difficulties  Cognition Arousal/Alertness:  Awake/alert Behavior During Therapy: WFL for tasks assessed/performed Overall Cognitive Status: Within Functional Limits for tasks assessed                      General Comments      Exercises Total Joint Exercises Ankle Circles/Pumps: AROM;Both;15 reps;Supine Quad Sets: AROM;Both;15 reps;Supine Straight Leg Raises: AAROM;Left;5 reps;Supine Long Arc Quad: AAROM;Left;5 reps;Seated Goniometric ROM: 10-70      Assessment/Plan    PT Assessment Patient needs continued PT services  PT Diagnosis Difficulty walking;Abnormality of gait;Acute pain   PT Problem List Decreased strength;Decreased range of motion;Decreased activity tolerance;Decreased balance;Decreased mobility;Decreased knowledge of use of DME;Pain;Obesity;Decreased knowledge of precautions  PT Treatment Interventions DME instruction;Gait training;Stair training;Functional mobility training;Therapeutic activities;Therapeutic exercise;Balance training;Patient/family education;Neuromuscular re-education   PT Goals (Current goals can be found in the Care Plan section) Acute Rehab PT Goals Patient Stated Goal: return home PT Goal Formulation: With patient Time For Goal Achievement: 02/13/14 Potential to Achieve Goals: Good    Frequency 7X/week   Barriers to discharge        Co-evaluation               End of Session Equipment Utilized During Treatment: Gait belt;Left knee immobilizer Activity Tolerance: Patient tolerated treatment well Patient left: in chair;with call bell/phone within reach Nurse Communication: Mobility status         Time: 0826-0900 PT Time Calculation (min): 34 min   Charges:   PT Evaluation $Initial PT Evaluation Tier I: 1 Procedure PT Treatments $Therapeutic Activity: 23-37 mins   PT G Codes:        Leighton Roach, PT  Acute Rehab Services  [place], [place] 02/06/2014, 10:32 AM

## 2014-02-06 NOTE — Progress Notes (Signed)
Utilization review completed.  

## 2014-02-06 NOTE — Progress Notes (Signed)
RN called into room while pt c/o pain 10/10. RN then overlooked prn dilaudid time frame orders and administered 1 mg dilaudid 45 min early for a q3h prn dose. Pt currently receiving o2 through a CPAP & connected to a continuous pulse ox. VS rechecked at 0145, pts VS stable, pt in no evident distress. Pain level has reduced to a 5/10 and is resting comfortably, safety zone filled out. Nursing will continue to monitor.

## 2014-02-06 NOTE — Evaluation (Signed)
Occupational Therapy Evaluation Patient Details Name: Melanie Hood MRN: [id number] DOB: [birth year] Today's Date: 02/06/2014    History of Present Illness Pt presents for left TKA. Had right TKA 12/13/13. H/o morbid obesity.   Clinical Impression   Patient screened by Occupational Therapy for above diagnosis and determined to not need further acute OT.  Patient declined to get out of bed secondary to experiencing left knee spasms in addition to she had recent TKR so she feels confident from a BADL & IADL standpoint that she will be fine at discharge.  Education has been completed and the patient has no further questions. OT is signing off. Thank you for this referral.     Follow Up Recommendations  No OT follow up    Equipment Recommendations  None recommended by OT    Precautions / Restrictions Precautions Precautions: Knee Required Braces or Orthoses: Knee Immobilizer - Left Restrictions Weight Bearing Restrictions: Yes LLE Weight Bearing: Weight bearing as tolerated      Pertinent Vitals/Pain Patient reports knee spasms, not rated.     Hand Dominance Right     Home Living Family/patient expects to be discharged to:: Private residence Living Arrangements: Children Available Help at Discharge: Family;Available 24 hours/day Type of Home: House Home Access: Stairs to enter [employer] of Steps: 1   Home Layout: Two level;Able to live on main level with bedroom/bathroom     Bathroom Shower/Tub: Occupational psychologist: Standard     Home Equipment: Cane - single point;Walker - 2 wheels   Additional Comments: pt's daughter came to stay with her last time and plans to do so again.  When patient is medically stable she feels prepared to discharge home without any additional OT services, AE or DME secondary she already has this equip from previous knee replacement.      Prior Functioning/Environment Level of Independence: Independent   Comments:  since last TKA, had progressed to ambulation without AD and returned to independence         End of Session Patient left: in bed;with call bell/phone within reach   [facility], [place] 02/06/2014, 1:26 PM

## 2014-02-06 NOTE — Care Management Note (Signed)
CARE MANAGEMENT NOTE 02/06/2014  Patient:  REGANNE, MESSERSCHMIDT   Account Number:  [id number]  Date Initiated:  02/06/2014  Documentation initiated by:  Ricki Miller  Subjective/Objective Assessment:   58 yr old female admitted with left knee DJD, patient had a left total knee arthroplasty.     Action/Plan:   Case manager spoke with patient concerning home health and DME needs. Choice offered. Referral called to Amy, [facility] Liaison.Patient has support at discharge. Patient has roling walker and 3in1.   Anticipated DC Date:  02/07/2014   Anticipated DC Plan:  [place]  CM consult      [facility] Choice  HOME HEALTH  DURABLE MEDICAL EQUIPMENT   Choice offered to / List presented to:  C-1 Patient   DME arranged  CPM      DME agency  [employer]     [place] arranged  HH-2 PT      [place].   Status of service:  Completed, signed off Medicare Important Message given?   (If response is "NO", the following Medicare IM given date fields will be blank) Date Medicare IM given:   Medicare IM given by:   Date Additional Medicare IM given:   Additional Medicare IM given by:    Discharge Disposition:  [place]  Per UR Regulation:  Reviewed for med. necessity/level of care/duration of stay

## 2014-02-06 NOTE — Progress Notes (Signed)
Physical Therapy Treatment Patient Details Name: Melanie Hood MRN: [id number] DOB: [birth year] Today's Date: 02/06/2014    History of Present Illness Pt presents for left TKA. Had right TKA 12/13/13. H/o morbid obesity.    PT Comments    Pt progressing with mobility but she is frustrated by minimal progress due to pain and spasming left knee. Pt ambulated 20' with RW and min A. PT will continue to follow.   Follow Up Recommendations  Home health PT;Supervision/Assistance - 24 hour     Equipment Recommendations  None recommended by PT    Recommendations for Other Services       Precautions / Restrictions Precautions Precautions: Knee Precaution Comments: discussed proper positioning, especially for extension as she is still lacking extension right knee Required Braces or Orthoses: Knee Immobilizer - Left Restrictions Weight Bearing Restrictions: Yes LLE Weight Bearing: Weight bearing as tolerated    Mobility  Bed Mobility Overal bed mobility: Needs Assistance Bed Mobility: Supine to Sit;Sit to Supine     Supine to sit: Modified independent (Device/Increase time) Sit to supine: Min assist   General bed mobility comments: used belt to get to EOB independently, required assist to LLE to return to bed due to pain'  Transfers Overall transfer level: Needs assistance Equipment used: Rolling walker (2 wheeled) Transfers: Sit to/from Stand Sit to Stand: Min assist         General transfer comment: min A from bed and toilet to steady, pt needing increased time to transfer hands from stable surface to RW due to spasming  Ambulation/Gait Ambulation/Gait assistance: Min assist Ambulation Distance (Feet): 20 Feet Assistive device: Rolling walker (2 wheeled) Gait Pattern/deviations: Step-to pattern;Decreased weight shift to left;Decreased stance time - left Gait velocity: decreased   General Gait Details: trunk flexed, vc's for posture   Stairs             Wheelchair Mobility    Modified Rankin (Stroke Patients Only)       Balance Overall balance assessment: Needs assistance Sitting-balance support: No upper extremity supported Sitting balance-Leahy Scale: Normal     Standing balance support: Single extremity supported;During functional activity Standing balance-Leahy Scale: Poor Standing balance comment: stood with unilateral UE support for self cleaning after toileting, does not appear to have underlying balance deficits, but affected by pain today                    Cognition Arousal/Alertness: Awake/alert Behavior During Therapy: WFL for tasks assessed/performed Overall Cognitive Status: Within Functional Limits for tasks assessed                      Exercises Total Joint Exercises Ankle Circles/Pumps: AROM;Both;15 reps;Supine Quad Sets: AROM;Both;15 reps;Supine Heel Slides: AROM;10 reps;Seated;Left Long Arc Quad: AROM;Both;10 reps;Seated Goniometric ROM: 10-70    General Comments        Pertinent Vitals/Pain Pain Assessment: Faces Faces Pain Scale: Hurts whole lot Pain Location: left knee Pain Descriptors / Indicators: Spasm Pain Intervention(s): Limited activity within patient's tolerance;Patient requesting pain meds-RN notified    Home Living Family/patient expects to be discharged to:: Private residence Living Arrangements: Children Available Help at Discharge: Family;Available 24 hours/day Type of Home: House Home Access: Stairs to enter   Home Layout: Two level;Able to live on main level with bedroom/bathroom Home Equipment: Kasandra Knudsen - single point;Walker - 2 wheels Additional Comments: pt's daughter came to stay with her last time and plans to do so again.  When patient is medically  stable she feels prepared to discharge home without any additional OT services, AE or DME secondary she already has this equip from previous knee replacement.    Prior Function Level of Independence:  Independent      Comments: since last TKA, had progressed to ambulation without AD and returned to independence   PT Goals (current goals can now be found in the care plan section) Acute Rehab PT Goals Patient Stated Goal: return home PT Goal Formulation: With patient Time For Goal Achievement: 02/13/14 Potential to Achieve Goals: Good Progress towards PT goals: Progressing toward goals    Frequency  7X/week    PT Plan Current plan remains appropriate    Co-evaluation             End of Session Equipment Utilized During Treatment: Gait belt;Left knee immobilizer Activity Tolerance: Patient limited by pain Patient left: in bed;in CPM;with call bell/phone within reach     Time: 1444-1515 PT Time Calculation (min): 31 min  Charges:  $Gait Training: 8-22 mins $Therapeutic Activity: 8-22 mins                    G Codes:     Leighton Roach, PT  Acute Rehab Services  [phone]  Leighton Roach 02/06/2014, 3:29 PM

## 2014-02-06 NOTE — Progress Notes (Signed)
Subjective: 1 Day Post-Op Procedure(s) (LRB): LEFT TOTAL KNEE ARTHROPLASTY (Left)  Activity level:  wbat Diet tolerance:  Eating well Voiding:  Foley removed this morning.  Patient reports pain as mild.    Objective: Vital signs in last 24 hours: Temp:  [97.9 F (36.6 C)-99.7 F (37.6 C)] 99.7 F (37.6 C) (10/28 0447) Pulse Rate:  [66-104] 98 (10/28 0447) Resp:  [9-22] 17 (10/28 0447) BP: (101-149)/(54-101) 112/73 mmHg (10/28 0447) SpO2:  [87 %-100 %] 96 % (10/28 0447) Weight:  [129.275 kg (285 lb)] 129.275 kg (285 lb) (10/27 0903)  Labs:  Recent Labs  02/06/14 0542  HGB 9.8*    Recent Labs  02/06/14 0542  WBC 10.3  RBC 3.43*  HCT 30.7*  PLT 293    Recent Labs  02/06/14 0542  NA 137  K 3.4*  CL 99  CO2 24  BUN 11  CREATININE 0.83  GLUCOSE 151*  CALCIUM 8.7   No results found for this basename: LABPT, INR,  in the last 72 hours  Physical Exam:  Neurologically intact ABD soft Neurovascular intact Sensation intact distally Intact pulses distally Dorsiflexion/Plantar flexion intact Incision: dressing C/D/I No cellulitis present Compartment soft  Assessment/Plan:  1 Day Post-Op Procedure(s) (LRB): LEFT TOTAL KNEE ARTHROPLASTY (Left) Advance diet Up with therapy D/C IV fluids Plan for discharge tomorrow Discharge home with home health if doing well and cleared by PT. Continue on ASA 325mg  BID x 2 weeks post op.  Follow up in office 2 weeks post op.  Dressing changed to aquacel this morning.   Daizee Firmin, Larwance Sachs 02/06/2014, 8:02 AM

## 2014-02-06 NOTE — Progress Notes (Signed)
Orthopedic Tech Progress Note Patient Details:  Melanie Hood 1955/09/22 [id number] On cpm at 7:45 pm Patient ID: [name], female   DOB: 1956/01/21, 57 y.o.   MRN: [id number]   [name] 02/06/2014, 7:45 PM

## 2014-02-07 ENCOUNTER — Encounter (HOSPITAL_COMMUNITY): Payer: Self-pay | Admitting: Orthopaedic Surgery

## 2014-02-07 LAB — GLUCOSE, CAPILLARY
GLUCOSE-CAPILLARY: 114 mg/dL — AB (ref 70–99)
Glucose-Capillary: 147 mg/dL — ABNORMAL HIGH (ref 70–99)
Glucose-Capillary: 135 mg/dL — ABNORMAL HIGH (ref 70–99)
Glucose-Capillary: 133 mg/dL — ABNORMAL HIGH (ref 70–99)

## 2014-02-07 LAB — CBC
HCT: 30 % — ABNORMAL LOW (ref 36.0–46.0)
Hemoglobin: 9.5 g/dL — ABNORMAL LOW (ref 12.0–15.0)
MCH: 28.2 pg (ref 26.0–34.0)
MCHC: 31.7 g/dL (ref 30.0–36.0)
MCV: 89 fL (ref 78.0–100.0)
PLATELETS: 273 10*3/uL (ref 150–400)
RBC: 3.37 MIL/uL — ABNORMAL LOW (ref 3.87–5.11)
RDW: 14.4 % (ref 11.5–15.5)
WBC: 17.9 10*3/uL — ABNORMAL HIGH (ref 4.0–10.5)

## 2014-02-07 NOTE — Progress Notes (Signed)
Physical Therapy Treatment Patient Details Name: Melanie Hood MRN: [id number] DOB: [birth year] Today's Date: 02/07/2014    History of Present Illness Pt presents for left TKA. Had right TKA 12/13/13. H/o morbid obesity.    PT Comments    Pt. Says she is feeling some better today and was able to progress her gait and exercise.  She does fatigue rather quickly and needed to go back to bed upon completion of session so she could rest.    Follow Up Recommendations  Home health PT;Supervision/Assistance - 24 hour     Equipment Recommendations  None recommended by PT    Recommendations for Other Services       Precautions / Restrictions Precautions Precautions: Knee Required Braces or Orthoses: Knee Immobilizer - Left Knee Immobilizer - Left: Other (comment) (until discontinued) Restrictions Weight Bearing Restrictions: Yes LLE Weight Bearing: Weight bearing as tolerated    Mobility  Bed Mobility Overal bed mobility: Needs Assistance Bed Mobility: Sit to Supine       Sit to supine: Min assist   General bed mobility comments: pt. using own leather belt as leg loop assist; managing own L LE but still needed min assist for sit to supine even with use of belt  Transfers Overall transfer level: Needs assistance Equipment used: Rolling walker (2 wheeled) Transfers: Sit to/from Stand Sit to Stand: Min assist         General transfer comment: min steadying assist from recliner and to bed  Ambulation/Gait Ambulation/Gait assistance: Min assist Ambulation Distance (Feet): 80 Feet Assistive device: Rolling walker (2 wheeled) Gait Pattern/deviations: Step-through pattern;Decreased weight shift to left;Decreased stance time - left Gait velocity: decreased   General Gait Details: emerging step through pattern today, min assist for safety and stability   Stairs            Wheelchair Mobility    Modified Rankin (Stroke Patients Only)       Balance                                    Cognition Arousal/Alertness: Awake/alert Behavior During Therapy: WFL for tasks assessed/performed Overall Cognitive Status: Within Functional Limits for tasks assessed                      Exercises Total Joint Exercises Ankle Circles/Pumps: AROM;Both;15 reps;Supine Quad Sets: AROM;Both;15 reps;Supine Short Arc Quad: AAROM;Left;10 reps;Seated Hip ABduction/ADduction: AAROM;Left;10 reps;Seated Knee Flexion: AAROM;Left;5 reps;Seated Goniometric ROM: -5 to 70    General Comments        Pertinent Vitals/Pain Pain Assessment: 0-10 Pain Score: 5  Pain Location: left knee Pain Descriptors / Indicators: Aching Pain Intervention(s): Premedicated before session;Repositioned    Home Living                      Prior Function            PT Goals (current goals can now be found in the care plan section) Progress towards PT goals: Progressing toward goals    Frequency  7X/week    PT Plan Current plan remains appropriate    Co-evaluation             End of Session Equipment Utilized During Treatment: Gait belt;Left knee immobilizer Activity Tolerance: Patient tolerated treatment well Patient left: in bed;with call bell/phone within reach     Time: 1020-1050 PT Time Calculation (min): 30 min  Charges:  $Gait Training: 8-22 mins $Therapeutic Exercise: 8-22 mins                    G Codes:      Ladona Ridgel 02/07/2014, 12:32 PM ,Gerlean Ren PT Acute Rehab Services [phone] [place]

## 2014-02-07 NOTE — Progress Notes (Signed)
Subjective: 2 Days Post-Op Procedure(s) (LRB): LEFT TOTAL KNEE ARTHROPLASTY (Left)  Activity level:  wbat Diet tolerance:  Eating well Voiding:  ok Patient reports pain as mild.    Objective: Vital signs in last 24 hours: Temp:  [97.5 F (36.4 C)-99.9 F (37.7 C)] 99 F (37.2 C) (10/29 0630) Pulse Rate:  [91-112] 91 (10/29 0603) Resp:  [16] 16 (10/29 0315) BP: (94-123)/(59-77) 94/77 mmHg (10/29 0603) SpO2:  [93 %-98 %] 98 % (10/29 0603) Weight:  [128.822 kg (284 lb)] 128.822 kg (284 lb) (10/29 0500)  Labs:  Recent Labs  02/06/14 0542 02/07/14 0533  HGB 9.8* 9.5*    Recent Labs  02/06/14 0542 02/07/14 0533  WBC 10.3 17.9*  RBC 3.43* 3.37*  HCT 30.7* 30.0*  PLT 293 273    Recent Labs  02/06/14 0542  NA 137  K 3.4*  CL 99  CO2 24  BUN 11  CREATININE 0.83  GLUCOSE 151*  CALCIUM 8.7   No results found for this basename: LABPT, INR,  in the last 72 hours  Physical Exam:  Neurologically intact ABD soft Neurovascular intact Sensation intact distally Intact pulses distally Dorsiflexion/Plantar flexion intact Incision: dressing C/D/I No cellulitis present Compartment soft  Assessment/Plan:  2 Days Post-Op Procedure(s) (LRB): LEFT TOTAL KNEE ARTHROPLASTY (Left) Advance diet Up with therapy Plan for discharge tomorrow Discharge home with home health if continuing to do well and cleared by PT. Continue on ASA 325mg  BID x 2 weeks for DVT prevention. Follow up in office 2 weeks post op.     Chanda Laperle, Larwance Sachs 02/07/2014, 9:33 AM

## 2014-02-07 NOTE — Progress Notes (Signed)
Physical Therapy Treatment Patient Details Name: Melanie Hood MRN: [id number] DOB: 12-26-1955 Today's Date: 02/07/2014    History of Present Illness Pt presents for left TKA. Had right TKA 12/13/13. H/o morbid obesity.    PT Comments    Patient continues to make good progress with mobility. Anticipate DC tomorrow  Follow Up Recommendations  Home health PT;Supervision/Assistance - 24 hour     Equipment Recommendations  None recommended by PT    Recommendations for Other Services       Precautions / Restrictions Precautions Precautions: Knee Required Braces or Orthoses: Knee Immobilizer - Left Knee Immobilizer - Left: Other (comment) (until discontinued) Restrictions Weight Bearing Restrictions: Yes LLE Weight Bearing: Weight bearing as tolerated    Mobility  Bed Mobility Overal bed mobility: Needs Assistance Bed Mobility: Sit to Supine     Supine to sit: Modified independent (Device/Increase time) Sit to supine: Modified independent (Device/Increase time)   General bed mobility comments: use of belt to lift LLE in and out of bed  Transfers Overall transfer level: Modified independent Equipment used: Rolling walker (2 wheeled) Transfers: Sit to/from Stand Sit to Stand: Min assist         General transfer comment: min steadying assist from recliner and to bed  Ambulation/Gait Ambulation/Gait assistance: Min guard Ambulation Distance (Feet): 150 Feet Assistive device: Rolling walker (2 wheeled) Gait Pattern/deviations: Step-through pattern Gait velocity: decreased   General Gait Details: Working on step through pattern this session. Safe use of RW   Stairs            Wheelchair Mobility    Modified Rankin (Stroke Patients Only)       Balance                                    Cognition Arousal/Alertness: Awake/alert Behavior During Therapy: WFL for tasks assessed/performed Overall Cognitive Status: Within Functional Limits  for tasks assessed                      Exercises Total Joint Exercises Ankle Circles/Pumps: AROM;Both;15 reps;Supine Quad Sets: AROM;Both;15 reps;Supine Short Arc Quad: AAROM;Left;10 reps;Seated Hip ABduction/ADduction: AAROM;Left;10 reps;Seated Knee Flexion: AAROM;Left;5 reps;Seated Goniometric ROM: -5 to 70    General Comments        Pertinent Vitals/Pain Pain Assessment: 0-10 Pain Score: 5  Pain Location: Lknee Pain Descriptors / Indicators: Aching Pain Intervention(s): Monitored during session    Home Living                      Prior Function            PT Goals (current goals can now be found in the care plan section) Progress towards PT goals: Progressing toward goals    Frequency  7X/week    PT Plan Current plan remains appropriate    Co-evaluation             End of Session Equipment Utilized During Treatment: Gait belt;Left knee immobilizer Activity Tolerance: Patient tolerated treatment well Patient left: in bed;with call bell/phone within reach     Time: 1456-1510 PT Time Calculation (min): 14 min  Charges:  $Gait Training: 8-22 mins $Therapeutic Exercise: 8-22 mins                    G Codes:      Jacqualyn Posey 02/07/2014, 3:21 PM 02/07/2014 Felipa Eth  [place] pager [phone] office

## 2014-02-08 LAB — CBC
HEMATOCRIT: 24.6 % — AB (ref 36.0–46.0)
Hemoglobin: 8.1 g/dL — ABNORMAL LOW (ref 12.0–15.0)
MCH: 28.9 pg (ref 26.0–34.0)
MCHC: 32.9 g/dL (ref 30.0–36.0)
MCV: 87.9 fL (ref 78.0–100.0)
Platelets: 233 10*3/uL (ref 150–400)
RBC: 2.8 MIL/uL — AB (ref 3.87–5.11)
RDW: 14.3 % (ref 11.5–15.5)
WBC: 14.4 10*3/uL — ABNORMAL HIGH (ref 4.0–10.5)

## 2014-02-08 LAB — GLUCOSE, CAPILLARY
GLUCOSE-CAPILLARY: 122 mg/dL — AB (ref 70–99)
Glucose-Capillary: 126 mg/dL — ABNORMAL HIGH (ref 70–99)

## 2014-02-08 MED ORDER — METHOCARBAMOL 750 MG PO TABS: 750.0000 mg | ORAL_TABLET | Freq: Four times a day (QID) | ORAL | Status: DC | PRN

## 2014-02-08 MED ORDER — HYDROCODONE-ACETAMINOPHEN 10-325 MG PO TABS: 1.0000 | ORAL_TABLET | Freq: Four times a day (QID) | ORAL | Status: DC | PRN

## 2014-02-08 MED ORDER — ASPIRIN 325 MG PO TBEC: 325.0000 mg | DELAYED_RELEASE_TABLET | Freq: Two times a day (BID) | ORAL | Status: DC

## 2014-02-08 NOTE — Progress Notes (Signed)
Discharge instruction discussed with pt, patient stated understanding and no further questions. pitent ready to D/C.

## 2014-02-08 NOTE — Progress Notes (Signed)
Physical Therapy Treatment Patient Details Name: Melanie Hood MRN: [id number] DOB: February 24, 1956 Today's Date: 02/08/2014    History of Present Illness Pt presents for left TKA. Had right TKA 12/13/13. H/o morbid obesity.    PT Comments    Pt. Did not want to practice ambulation due to knee pain.  I reviewed step negotiation with her and padded her KI with an ABD pad applied to prevent pressure from [place] on her upper medial thigh.  Pt. Anticipates DC home today.  Should be ready from PT standpoint.  Follow Up Recommendations  Home health PT;Supervision/Assistance - 24 hour     Equipment Recommendations  None recommended by PT    Recommendations for Other Services       Precautions / Restrictions Precautions Precautions: Knee Required Braces or Orthoses: Knee Immobilizer - Left Restrictions Weight Bearing Restrictions: Yes LLE Weight Bearing: Weight bearing as tolerated Other Position/Activity Restrictions: Pt. indicated KI was causing a small area of pressure upper medial thigh.  I obtained an ABD pad and padded the KI at the point where pt. felt pressure.  Skin was intact and no redness noted.    Mobility  Bed Mobility Overal bed mobility:  (not tested; pt. seated at EOB)                Transfers Overall transfer level:  (pt. declined due to pain level)                  Ambulation/Gait Ambulation/Gait assistance:  (pt.declined due to pain level)               Stairs Stairs: Yes       General stair comments: Pt. did not want to come to PT gym for review of one step negotiation.  I simulated the procedure for her in her room , reviewing "up with good, down with bad" technique.  At pt. request, also revuewed stepping backward into walk in shower with use of RW.  Pt. verbalized understanding and recollection from recent surgery on opposite knee  Wheelchair Mobility    Modified Rankin (Stroke Patients Only)       Balance                                     Cognition Arousal/Alertness: Awake/alert Behavior During Therapy: WFL for tasks assessed/performed Overall Cognitive Status: Within Functional Limits for tasks assessed                      Exercises Total Joint Exercises Ankle Circles/Pumps: AROM;Both;15 reps;Seated Quad Sets: AROM;Both;10 reps Knee Flexion: AROM;Left;5 reps;Seated Goniometric ROM: 0 to 85    General Comments        Pertinent Vitals/Pain Pain Assessment: 0-10 Pain Score: 7  Pain Location: l knee Pain Descriptors / Indicators: Aching Pain Intervention(s): RN gave pain meds during session;Monitored during session;Limited activity within patient's tolerance;Repositioned    Home Living                      Prior Function            PT Goals (current goals can now be found in the care plan section) Progress towards PT goals: Progressing toward goals    Frequency  7X/week    PT Plan Current plan remains appropriate    Co-evaluation  End of Session   Activity Tolerance: Patient tolerated treatment well Patient left: in bed;with call bell/phone within reach;with nursing/sitter in room (RN in room to begin DC instructions)     Time: 4818-5909 PT Time Calculation (min): 23 min  Charges:  $Therapeutic Exercise: 8-22 mins $Self Care/Home Management: 12/25/2022                    G Codes:      Ladona Ridgel 02/08/2014, 2:10 PM Gerlean Ren PT Acute Rehab Services [place] [phone]

## 2014-02-08 NOTE — Discharge Summary (Signed)
Patient ID: Melanie Hood MRN: [id number] DOB/AGE: 1955/10/19 58 y.o.  Admit date: 02/05/2014 Discharge date: 02/08/2014  Admission Diagnoses:  Principal Problem:   Left knee DJD Active Problems:   Diabetes   Morbid obesity   Discharge Diagnoses:  Same  Past Medical History  Diagnosis Date  . Hypothyroidism   . GERD (gastroesophageal reflux disease)   . Diverticular disease of colon   . Arthritis   . Vein, varicose   . Heart disease   . Hypertension     mild left vent dysfunction   . Sleep apnea   . Diabetes mellitus     type 11     Surgeries: Procedure(s): LEFT TOTAL KNEE ARTHROPLASTY on 02/05/2014   Consultants:    Discharged Condition: Improved  Hospital Course: CHANDREA ZELLMAN is an 58 y.o. female who was admitted 02/05/2014 for operative treatment ofLeft knee DJD. Patient has severe unremitting pain that affects sleep, daily activities, and work/hobbies. After pre-op clearance the patient was taken to the operating room on 02/05/2014 and underwent  Procedure(s): LEFT TOTAL KNEE ARTHROPLASTY.    Patient was given perioperative antibiotics: Anti-infectives   Start     Dose/Rate Route Frequency Ordered Stop   02/05/14 1930  valACYclovir (VALTREX) tablet 500 mg     500 mg Oral Every evening 02/05/14 1807     02/05/14 1915  ceFAZolin (ANCEF) IVPB 2 g/50 mL premix     2 g 100 mL/hr over 30 Minutes Intravenous Every 6 hours 02/05/14 1623 02/06/14 0115   02/05/14 0600  ceFAZolin (ANCEF) 3 g in dextrose 5 % 50 mL IVPB     3 g 160 mL/hr over 30 Minutes Intravenous On call to O.R. 02/04/14 1445 02/05/14 1145       Patient was given sequential compression devices, early ambulation, and chemoprophylaxis to prevent DVT.  Patient benefited maximally from hospital stay and there were no complications.    Recent vital signs: Patient Vitals for the past 24 hrs:  BP Temp Temp src Pulse Resp SpO2  02/08/14 0607 104/61 mmHg 98 F (36.7 C) Oral 98 - 99 %  02/08/14 0400 - -  - - 16 96 %  02/07/14 2351 - - - - 18 95 %  02/07/14 2323 - - - 99 18 93 %  02/07/14 2101 110/50 mmHg 99.8 F (37.7 C) Oral 125 16 90 %  02/07/14 1509 110/57 mmHg 98.9 F (37.2 C) Oral 127 16 98 %     Recent laboratory studies:  Recent Labs  02/06/14 0542 02/07/14 0533 02/08/14 0620  WBC 10.3 17.9* 14.4*  HGB 9.8* 9.5* 8.1*  HCT 30.7* 30.0* 24.6*  PLT 293 273 233  NA 137  --   --   K 3.4*  --   --   CL 99  --   --   CO2 24  --   --   BUN 11  --   --   CREATININE 0.83  --   --   GLUCOSE 151*  --   --   CALCIUM 8.7  --   --      Discharge Medications:     Medication List    STOP taking these medications       aspirin 81 MG tablet  Replaced by:  aspirin 325 MG EC tablet      TAKE these medications       acetaminophen 650 MG CR tablet  Commonly known as:  TYLENOL  Take 1,300 mg by mouth every  8 (eight) hours as needed for pain.     albuterol 108 (90 BASE) MCG/ACT inhaler  Commonly known as:  PROVENTIL HFA;VENTOLIN HFA  Inhale 2 puffs into the lungs every 6 (six) hours as needed for wheezing or shortness of breath.     aspirin 325 MG EC tablet  Take 1 tablet (325 mg total) by mouth 2 (two) times daily after a meal.     atorvastatin 10 MG tablet  Commonly known as:  LIPITOR  Take 10 mg by mouth every evening.     Biotin 5000 MCG Caps  Take 5,000 mcg by mouth daily.     cetirizine 10 MG tablet  Commonly known as:  ZYRTEC  Take 10 mg by mouth every evening.     Chromium-Cinnamon 50-500 MCG-MG Caps  Take 2 capsules by mouth daily.     cyclobenzaprine 10 MG tablet  Commonly known as:  FLEXERIL  Take 10 mg by mouth every 6 (six) hours as needed for muscle spasms.     dicyclomine 10 MG capsule  Commonly known as:  BENTYL  Take 10 mg by mouth every 6 (six) hours as needed for spasms.     diltiazem 120 MG 24 hr tablet  Commonly known as:  CARDIZEM LA  Take 120 mg by mouth every evening.     fluticasone 50 MCG/ACT nasal spray  Commonly known as:   FLONASE  Place 2 sprays into both nostrils daily as needed for allergies.     hydrochlorothiazide 12.5 MG capsule  Commonly known as:  MICROZIDE  Take 12.5 mg by mouth every evening.     HYDROcodone-acetaminophen 10-325 MG per tablet  Commonly known as:  NORCO  Take 1-2 tablets by mouth every 6 (six) hours as needed for moderate pain or severe pain.     levothyroxine 175 MCG tablet  Commonly known as:  SYNTHROID, LEVOTHROID  Take 87.5-175 mcg by mouth daily before breakfast. Take a whole tablet (175) mon-sat and Sunday take a half tablet (87.5)     lisinopril 2.5 MG tablet  Commonly known as:  PRINIVIL,ZESTRIL  Take 2.5 mg by mouth every evening.     LORazepam 0.5 MG tablet  Commonly known as:  ATIVAN  Take 0.25-0.5 mg by mouth 2 (two) times daily as needed for anxiety.     metFORMIN 500 MG (MOD) 24 hr tablet  Commonly known as:  GLUMETZA  Take 500 mg by mouth every evening.     methocarbamol 750 MG tablet  Commonly known as:  ROBAXIN  Take 1 tablet (750 mg total) by mouth every 6 (six) hours as needed for muscle spasms.     pantoprazole 40 MG tablet  Commonly known as:  PROTONIX  Take 40 mg by mouth every evening.     therapeutic multivitamin-minerals tablet  Take 1 tablet by mouth daily.     traMADol 50 MG tablet  Commonly known as:  ULTRAM  Take 50 mg by mouth every 6 (six) hours as needed for moderate pain.     valACYclovir 500 MG tablet  Commonly known as:  VALTREX  Take 500 mg by mouth every evening.     Vitamin D-3 5000 UNITS Tabs  Take 5,000 Units by mouth every evening.        Diagnostic Studies: Ct Angio Chest Pe W/cm &/or Wo Cm  01/14/2014   CLINICAL DATA:  Shortness of breath and tachycardia 4 weeks after knee surgery. Evaluate for pulmonary embolism.  EXAM: CT ANGIOGRAPHY CHEST WITH  CONTRAST  TECHNIQUE: Multidetector CT imaging of the chest was performed using the standard protocol during bolus administration of intravenous contrast. Multiplanar CT  image reconstructions and MIPs were obtained to evaluate the vascular anatomy.  CONTRAST:  138mL OMNIPAQUE IOHEXOL 350 MG/ML SOLN  COMPARISON:  Radiographs 12/04/2013 and 09/15/2012. Abdominal CT 05/03/2012.  FINDINGS: Vascular: The pulmonary arteries are well opacified with contrast. There is no evidence of acute pulmonary embolism. There is no significant atherosclerosis.  Mediastinum: Small axillary lymph nodes are not pathologically enlarged. There are no enlarged mediastinal or hilar lymph nodes. The thyroid gland, trachea and esophagus appear normal.  Lungs/Pleura: There is no pleural or pericardial effusion.Dependent density in the left lower lobe is most consistent with progressive atelectasis or scarring. There is no suspicious residual focal nodularity in this area. Nodular thickening of the minor fissure on image 49 is characteristically benign. The lungs are otherwise clear.  Upper abdomen: Unremarkable.  There is no adrenal mass.  Musculoskeletal/Chest wall: No chest wall lesion or acute osseous findings.  Review of the MIP images confirms the above findings.  IMPRESSION: 1. No evidence of acute pulmonary embolism or other acute process. 2. Mildly increased atelectasis posteriorly at the left lung base.   Electronically Signed   By: Camie Patience M.D.   On: 01/14/2014 20:33    Disposition: 01-Home or Self Care      Discharge Instructions   Call MD / Call 911    Complete by:  As directed   If you experience chest pain or shortness of breath, CALL 911 and be transported to the hospital emergency room.  If you develope a fever above 101 F, pus (white drainage) or increased drainage or redness at the wound, or calf pain, call your surgeon's office.     Constipation Prevention    Complete by:  As directed   Drink plenty of fluids.  Prune juice may be helpful.  You may use a stool softener, such as Colace (over the counter) 100 mg twice a day.  Use MiraLax (over the counter) for constipation as  needed.     Diet - low sodium heart healthy    Complete by:  As directed      Increase activity slowly as tolerated    Complete by:  As directed            Follow-up Information   Follow up with Hessie Dibble, MD. Call in 2 weeks.   Specialty:  Orthopedic Surgery   Contact information:   [place] [address] [phone]       Follow up with [facility]. (Someone from [facility] will contact you concerning start date and time for physical therapy.)    Contact information:   [address] [place] [address] [phone]        Signed: Rich Fuchs 02/08/2014, 7:52 AM

## 2014-02-26 NOTE — Progress Notes (Signed)
Patient ID: Melanie Hood, female   DOB: January 21, 1956, 58 y.o.   MRN: [id number] Reviewed: Agree with the documentation and management of our [place].

## 2014-05-09 ENCOUNTER — Ambulatory Visit (INDEPENDENT_AMBULATORY_CARE_PROVIDER_SITE_OTHER): Payer: Self-pay | Admitting: Family Medicine

## 2014-05-09 VITALS — BP 132/88 | Wt 285.0 lb

## 2014-05-09 DIAGNOSIS — E118 Type 2 diabetes mellitus with unspecified complications: Secondary | ICD-10-CM

## 2014-05-16 NOTE — Progress Notes (Signed)
Patient presents today for 3 month diabetes follow-up as part of the employer-sponsored Link to Wellness program. Current diabetes regimen includes Metformin and cinnamon. Patient also continues on daily ASA and ACEi. Of note, patient underwent total knee replacement (right knee) on Sept 1st and again for left knee in Oct. She has recovered well and has finished PT. She remains out of work but returns 2/20. Continues doing PT exercises at home including squats and stationary bike. She last saw Dr. Leonides Schanz this past Monday and will follow-up again in 6 months. A1c at last apt was 6.5 (prev 6.8). Pt reports lipid panel was great, although she does not have results.   Diabetes Assessment: Type of Diabetes: Type 2; Year of diagnosis 2010; [employer] classes [place]; uses glucometer; takes medications as prescribed; takes an aspirin a day; checks feet weekly; Sees Diabetes provider 2 times per year; MD managing Diabetes Dr. [facility]; What are the signs of hyperglycemia? Frequent Uriination; Lowest CBG 88; 14 day CBG average 100; checks blood glucose 1-2 times a week; 7 day CBG average 101; 30 day CBG average 100; Highest CBG 115; Other Diabetes History: Current med regimen includes Metformin ER 500 mg daily. Patient does maintain good medication compliance. Patient did bring meter today and has only recently resumed occasional testing. She reports fastings in the 90s when she has tested. Hypoglycemia frequency is rare. Patient does demonstrate appropriate correction of hypoglycemia. Patient denies signs and symptoms of neuropathy including numbness/tingling/burning and symptoms of foot infection. Patient is not due for yearly eye exam. A1c has improved today, and is 5.8 (previously 6.6). Patient is very pleased with this progress given her recent recovery from total knee replacement.  Lifestyle Factors: Exercise - Patient has finished PT but continues doing PT exercises at home  including squats, stationary bike, and stretching. Wt loss goal of 10 lb per month for a total of 70 lb loss by her birthday in August.  Exercises include (completes this regimen every other day, total of 40 min each time): 10 lifts with barbell 20 min on bike and slowly increasing resistance 10 min treadmill 20 squats 1.5 zumba songs Diet - Patient has struggled in the past with food addiction but is working hard to make healthy choices and control portion sizes. She is cooking more at home and attempting to increase vegetable intake. She is ordering smaller meals at restaurants and is working to lose weight. She is has set a wt loss goal and would like to lose 70 lbs by her birthday in August.   Plan: 1) Continue exercising as much as tolerated. Great job with this!! 2) Continue to maintain healthy diet, great job with changes in this area 3) Continue testing as needed 4) Goal to reduce A1c to <6.5 5) Return for follow-up in 3 months on Thursday April 28th @ 4:00 pm

## 2014-05-21 ENCOUNTER — Other Ambulatory Visit (HOSPITAL_COMMUNITY): Payer: Self-pay | Admitting: Family Medicine

## 2014-05-21 DIAGNOSIS — Z1231 Encounter for screening mammogram for malignant neoplasm of breast: Secondary | ICD-10-CM

## 2014-05-21 NOTE — Progress Notes (Signed)
Patient ID: Melanie Hood, female   DOB: 10/12/1955, 59 y.o.   MRN: [id number] Reviewed: Agree with documentation and management of our [facility] pharmacologist.

## 2014-05-23 ENCOUNTER — Ambulatory Visit (HOSPITAL_COMMUNITY)
Admission: RE | Admit: 2014-05-23 | Discharge: 2014-05-23 | Disposition: A | Discharge status: Home / Self Care | Payer: 59 | Source: Ambulatory Visit | Attending: Family Medicine | Admitting: Family Medicine

## 2014-05-23 DIAGNOSIS — Z1231 Encounter for screening mammogram for malignant neoplasm of breast: Secondary | ICD-10-CM | POA: Insufficient documentation

## 2014-08-08 ENCOUNTER — Ambulatory Visit: Payer: 59 | Admitting: Pharmacist

## 2014-08-15 ENCOUNTER — Ambulatory Visit (INDEPENDENT_AMBULATORY_CARE_PROVIDER_SITE_OTHER): Payer: Self-pay | Admitting: Family Medicine

## 2014-08-15 ENCOUNTER — Ambulatory Visit: Payer: 59 | Admitting: Pharmacist

## 2014-08-15 VITALS — BP 126/74 | Ht 71.0 in | Wt 272.0 lb

## 2014-08-15 DIAGNOSIS — E119 Type 2 diabetes mellitus without complications: Secondary | ICD-10-CM

## 2014-08-15 NOTE — Progress Notes (Signed)
Subjective:  Patient is a 59 yo female with type 2 diabetes who presents today for 3 month follow-up as part of the employer-sponsored Link to Wellness program. Current diabetes regimen includes Metformin ER 500 mg daily. Patient also continues on daily ASA, ACEi, and statin. Most recent MD follow-up was Jan 2016. Patient has a pending appt for July 2016. No med changes or major health changes at this time.  Patient continues doing well after TKR this past Sept and October.    Diabetes Assessment:  No change to diabetes regimen at this time.  Of note, patient is participating in [facility] diabetes research program through [facility].  This program will end in June.  Pt feels this has been a great program, she has liked the [employer] glucometer, and the corresponding app.  Patient does maintain good medication compliance. Most recent A1c was 6.5% which is at goal of less than 7%.  Weight has decreased by 13 lb since last visit.  Patient did bring meter today and is currently testing 1 time per day. Glucose monitoring occurs fasting.  Hypoglycemia is occassional. Patient does demonstrate appropriate correction of hypoglycemia.  Pt received steroid injection to left hip this past April and reports this caused the increased glucose readings during this time.  Patient is up to date on eye and dental exam.      Glucose averages: 7d - 7 tests, 92-116, 29%hi, 71%, 0 lo 14d - 18 tests, 143-78, 22% hi, 78% norm, 0 lo 30d - 46 tests, 67-143, 16% hi, 80% norm, 4% lo 90d - 84 tests, 55-143, 18% hi, 77% norm, 5% lo  Lifestyle Assessment:  Diet - Overall diet has improved with help of [employer] diabetes study.  She limits carbs to 45 g per meal and has increased fruits and vegetables.  Portion control has improved and she is reading nutrition labels more.  She does allow Sunday to be her cheat day.  Pt has lost 13 lbs since last visit.     Exercise - Patient is now walking 45 min and cycling 20 min 2-3 days per  week.  She owns her own stationary bike, treadmill, and elliptical and also does several workout videos.  Her goal is to increase to 4 days per week.  Also wearing a pedometer regularly.   Plan and Goals: 1)  Continue making healthy dietary choices and watching portion size 2)  Continue to exercise 2-3 days per week and attempt to increase to 4 days per week as tolerated 3)  Continue testing regularly 4)  Great job with 13 lb wt loss!! 5)  Follow-up in 3 months on Thursday August 18th @ 8:30    Tilman Neat, PharmD Link to [place]  [phone]

## 2014-08-15 NOTE — Patient Instructions (Addendum)
1)  Continue making healthy dietary choices and watching portion size 2)  Continue to exercise 2-3 days per week and attempt to increase to 4 days per week as tolerated 3)  Continue testing regularly 4)  Great job with 13 lb wt loss!! 5)  Follow-up in 3 months on Thursday August 18th @ 8:30   Great to see you today!

## 2014-08-22 NOTE — Progress Notes (Signed)
ATTENDING PHYSICIAN NOTE: I have reviewed the chart and agree with the plan as detailed above. Dannetta Lekas MD Pager [phone]  

## 2014-11-28 ENCOUNTER — Ambulatory Visit: Payer: 59 | Admitting: Pharmacist

## 2014-12-10 ENCOUNTER — Ambulatory Visit (INDEPENDENT_AMBULATORY_CARE_PROVIDER_SITE_OTHER): Payer: Self-pay | Admitting: Family Medicine

## 2014-12-10 ENCOUNTER — Encounter: Payer: Self-pay | Admitting: Pharmacist

## 2014-12-10 VITALS — BP 118/64 | Wt 285.0 lb

## 2014-12-10 DIAGNOSIS — E119 Type 2 diabetes mellitus without complications: Secondary | ICD-10-CM

## 2014-12-10 NOTE — Patient Instructions (Signed)
1)  Resume carb counting, limiting to 45 g per meal, and limit fast food 2)  Resume exercising 2-3 days per week, using stationary bike and treadmill 3)  Resume testing once daily 5)  Follow-up in 3 months on Thursday Dec 1st @ 8:30  Great to see you today!

## 2014-12-10 NOTE — Progress Notes (Signed)
Subjective:  Patient is a 59 yo female with type 2 diabetes who presents today for 3 month follow-up as part of the employer-sponsored Link to Wellness program. Current diabetes regimen includes Metformin ER 500 mg daily. Patient also continues on daily ASA, ACEi, and statin. Most recent MD follow-up was July 2016. Patient has a pending appt for Jan 2017. No med changes or major health changes at this time.  Patient continues doing well after TKR this past Sept and October; however, she recently developed an iliotibial band in her right leg from hip to knee.    Diabetes Assessment:  No change to diabetes regimen at this time.  Patient is no longer participating in [facility] diabetes research program.  Program ended in June.  Patient does maintain good medication compliance. Most recent A1c was 6.2% (recently 6.5%) which is further improved and at goal of less than 7%.  Patients personal goal is to achieve A1c of <6%.  Weight has increased by 13 lb since last visit (she has gained what was recently lost).  This has been due to various life stressors resulting in patient abandoning diet, exercise regimens.  Patient did not bring meter today as she is not currently testing.  She understands the importance of this and will resume testing once daily.  Patient is up to date on eye and dental exams.  No signs of neuropathy or infection at this time.   Lifestyle Assessment:  Diet - Diet has deteriorated recently due to various life stressors and working overtime causing her to eat much more fast food.  Previously she was limiting carbs to 45 g per meal and had increased fruits and vegetables.  She will resume this diet and feels it is a reasonable goal.   Exercise - No exercise at this time.  Although patient has made a committment, starting last week, to resume using stationary bike and treadmill for 15 minutes, 2-3 times per week.  She reports this does help reduce soreness and pain due to iliotibial  band.   Plan and Goals: 1)  Resume carb counting, limiting to 45 g per meal, and limit fast food 2)  Resume exercising 2-3 days per week, using stationary bike and treadmill 3)  Resume testing once daily 5)  Follow-up in 3 months on Thursday Dec 1st @ 8:30  Great to see you today!   Tilman Neat, PharmD Link to [facility]  [phone]

## 2014-12-18 NOTE — Progress Notes (Signed)
ATTENDING PHYSICIAN NOTE: I have reviewed the chart and agree with the plan as detailed above. Woodward Klem MD Pager [phone]  

## 2015-01-10 ENCOUNTER — Ambulatory Visit: Payer: 59 | Admitting: Cardiology

## 2015-01-15 ENCOUNTER — Encounter: Payer: Self-pay | Admitting: Cardiology

## 2015-01-20 ENCOUNTER — Encounter: Payer: Self-pay | Admitting: Cardiology

## 2015-01-20 ENCOUNTER — Ambulatory Visit (INDEPENDENT_AMBULATORY_CARE_PROVIDER_SITE_OTHER): Payer: 59 | Admitting: Cardiology

## 2015-01-20 VITALS — BP 128/84 | HR 72 | Ht 70.0 in | Wt 298.6 lb

## 2015-01-20 DIAGNOSIS — E785 Hyperlipidemia, unspecified: Secondary | ICD-10-CM | POA: Diagnosis not present

## 2015-01-20 DIAGNOSIS — I10 Essential (primary) hypertension: Secondary | ICD-10-CM | POA: Diagnosis not present

## 2015-01-20 MED ORDER — FUROSEMIDE 40 MG PO TABS: 40.0000 mg | ORAL_TABLET | Freq: Every day | ORAL | Status: DC

## 2015-01-20 NOTE — Patient Instructions (Addendum)
Medication Instructions:  Your physician has recommended you make the following change in your medication:  START Lasix 40mg  daily as needed for swelling. An Rx has been sent to your pharmacy   Labwork: None ordered  Testing/Procedures: Your physician has requested that you have an echocardiogram. Echocardiography is a painless test that uses sound waves to create images of your heart. It provides your doctor with information about the size and shape of your heart and how well your heart's chambers and valves are working. This procedure takes approximately one hour. There are no restrictions for this procedure.   Follow-Up: Your physician wants you to follow-up in: 1 year with Dr.Nelson You will receive a reminder letter in the mail two months in advance. If you don't receive a letter, please call our office to schedule the follow-up appointment.   Any Other Special Instructions Will Be Listed Below (If Applicable).

## 2015-01-20 NOTE — Progress Notes (Signed)
Patient ID: Melanie Hood, female   DOB: 20-Feb-1956, 59 y.o.   MRN: [id number]      Cardiology Office Note  Date:  01/20/2015   ID:  Sherisa, Gilvin Jun 30, 1955, MRN [id number]  PCP:  Cari Caraway, MD  Cardiologist:  Dorothy Spark, MD   No chief complaint on file.   History of Present Illness: Melanie Hood is a 59 y.o. female who presents to establish cardiology car. She has h/o obesity. DM, hypertension and hyperlipidemia who used to see a cardiologist at [facility]. She is a very motivated patient who exercises a lot, she works as a Marine scientist and is very educated about cardiac diet and exercise. In the past she lost 70 lbs, but then lost her grandson and was depressed.  She denies any CP, SOB, she has on and off LE edema, for which exercise helps. She denies palpitations, syncope.   Past Medical History  Diagnosis Date  . Hypothyroidism   . GERD (gastroesophageal reflux disease)   . Diverticular disease of colon   . Arthritis   . Vein, varicose   . Heart disease   . Hypertension     mild left vent dysfunction   . Sleep apnea   . Diabetes mellitus     type 11   . Graves disease     status post radioactive iodine treatment  . Allergic rhinitis   . Obesity   . Chronic back pain   . Exercise-induced asthma     Past Surgical History  Procedure Laterality Date  . Appendectomy    . Cholecystectomy    . Abdominal hysterectomy    . Varicose vein surgery    . Foot arthroplasty      rt foot as child  . Upper gastrointestinal endoscopy    . Knee arthroscopy  04/20/2011    Procedure: ARTHROSCOPY KNEE;  Surgeon: Hessie Dibble, MD;  Location: [place];  Service: Orthopedics;  Laterality: Right;  right knee arthroscopy partial medial menisectomy and chondroplasty.  . Total knee arthroplasty Right 12/11/2013    Procedure: TOTAL KNEE ARTHROPLASTY;  Surgeon: Hessie Dibble, MD;  Location: [place];  Service: Orthopedics;  Laterality: Right;  . Eye surgery    .  Total knee arthroplasty Left 02/05/2014    Procedure: LEFT TOTAL KNEE ARTHROPLASTY;  Surgeon: Hessie Dibble, MD;  Location: [place];  Service: Orthopedics;  Laterality: Left;     Current Outpatient Prescriptions  Medication Sig Dispense Refill  . acetaminophen (TYLENOL) 650 MG CR tablet Take 1,300 mg by mouth every 8 (eight) hours as needed for pain.     Marland Kitchen albuterol (PROVENTIL HFA;VENTOLIN HFA) 108 (90 BASE) MCG/ACT inhaler Inhale 2 puffs into the lungs every 6 (six) hours as needed for wheezing or shortness of breath.    Marland Kitchen aspirin 81 MG tablet Take 81 mg by mouth daily.    Marland Kitchen atorvastatin (LIPITOR) 10 MG tablet Take 10 mg by mouth every evening.     . Biotin 5000 MCG CAPS Take 5,000 mcg by mouth daily.     . cetirizine (ZYRTEC) 10 MG tablet Take 10 mg by mouth every evening.     . Cholecalciferol (VITAMIN D-3) 5000 UNITS TABS Take 5,000 Units by mouth every evening.    . Chromium-Cinnamon 50-500 MCG-MG CAPS Take 2 capsules by mouth daily.     . cyclobenzaprine (FLEXERIL) 10 MG tablet Take 10 mg by mouth every 6 (six) hours as needed for muscle spasms.    Marland Kitchen  dicyclomine (BENTYL) 10 MG capsule Take 10 mg by mouth every 6 (six) hours as needed for spasms.     Marland Kitchen diltiazem (CARDIZEM LA) 120 MG 24 hr tablet Take 120 mg by mouth every evening.     . fluticasone (FLONASE) 50 MCG/ACT nasal spray Place 2 sprays into both nostrils daily as needed for allergies.     . hydrochlorothiazide (MICROZIDE) 12.5 MG capsule Take 12.5 mg by mouth every evening.     Marland Kitchen HYDROcodone-acetaminophen (NORCO) 10-325 MG per tablet Take 1-2 tablets by mouth every 6 (six) hours as needed for moderate pain or severe pain. 50 tablet 0  . levothyroxine (SYNTHROID, LEVOTHROID) 175 MCG tablet Take 87.5-175 mcg by mouth daily before breakfast. Take a whole tablet (175) mon-sat and Sunday take a half tablet (87.5)    . lisinopril (PRINIVIL,ZESTRIL) 2.5 MG tablet Take 2.5 mg by mouth every evening.     Marland Kitchen LORazepam (ATIVAN) 0.5 MG  tablet Take 0.25-0.5 mg by mouth 2 (two) times daily as needed for anxiety.     . metFORMIN (GLUMETZA) 500 MG (MOD) 24 hr tablet Take 500 mg by mouth every evening.    . methocarbamol (ROBAXIN) 750 MG tablet Take 1 tablet (750 mg total) by mouth every 6 (six) hours as needed for muscle spasms. 50 tablet 0  . pantoprazole (PROTONIX) 40 MG tablet Take 40 mg by mouth every evening.     . therapeutic multivitamin-minerals (THERAGRAN-M) tablet Take 1 tablet by mouth daily.      . traMADol (ULTRAM) 50 MG tablet Take 50 mg by mouth every 6 (six) hours as needed for moderate pain.    . valACYclovir (VALTREX) 500 MG tablet Take 500 mg by mouth every evening.      No current facility-administered medications for this visit.    Allergies:   Septra; Metoprolol tartrate; Minocycline; Septra; Codeine; Erythromycin; Nsaids; and Tetracyclines & related    Social History:  The patient  reports that she quit smoking about 28 years ago. She does not have any smokeless tobacco history on file. She reports that she drinks alcohol. She reports that she does not use illicit drugs.   Family History:  The patient's family history includes Colon cancer in her sister; Diabetes in her father and mother.    ROS:  Please see the history of present illness.   Otherwise, review of systems are positive for none.   All other systems are reviewed and negative.    PHYSICAL EXAM: VS:  BP 128/84 mmHg  Pulse 72  Ht 5\' 10"  (1.778 m)  Wt 298 lb 9.6 oz (135.444 kg)  BMI 42.84 kg/m2 , BMI Body mass index is 42.84 kg/(m^2). GEN: Well nourished, well developed, in no acute distress HEENT: normal Neck: no JVD, carotid bruits, or masses Cardiac: RRR; no murmurs, rubs, or gallops,no edema  Respiratory:  clear to auscultation bilaterally, normal work of breathing GI: soft, nontender, nondistended, + BS MS: no deformity or atrophy Skin: warm and dry, no rash Neuro:  Strength and sensation are intact Psych: euthymic mood, full  affect   EKG:  EKG is ordered today. The ekg ordered today demonstrates SR, normal ECG   Recent Labs: 02/06/2014: BUN 11; Creatinine, Ser 0.83; Potassium 3.4*; Sodium 137 02/08/2014: Hemoglobin 8.1*; Platelets 233    Lipid Panel No results found for: CHOL, TRIG, HDL, CHOLHDL, VLDL, LDLCALC, LDLDIRECT    Wt Readings from Last 3 Encounters:  01/20/15 298 lb 9.6 oz (135.444 kg)  12/10/14 285 lb (129.275  kg)  08/15/14 272 lb (123.378 kg)      Other studies Reviewed: Lipids from [facility]: LDL 95, HDL 62, TG 65   ASSESSMENT AND PLAN:  1.  Hypertension - controlled on current regimen, check baseline echocardiogram to evaluate for systolic and diastolic function and LVH  2. Hyperlipidemia - LDL 95, goal < 70, on atorvastatin 10 mg po daily, room for improvement in her diet, we will continue the same regimen for now  3. Obesity - she is motivated to loose weight, encouraged  4. Preventive cardiology - advised about signs of possible ischemia.   5. LE edema - PRN lasix 40 mg po daily  Follow up in 1 year.  Signed, Dorothy Spark, MD  01/20/2015 8:27 AM    [facility] [place], [place], [place]  [address] Phone: [phone]; Fax: [phone]

## 2015-02-04 ENCOUNTER — Other Ambulatory Visit: Payer: Self-pay

## 2015-02-04 ENCOUNTER — Ambulatory Visit (HOSPITAL_COMMUNITY): Payer: 59 | Attending: Internal Medicine

## 2015-02-04 DIAGNOSIS — I10 Essential (primary) hypertension: Secondary | ICD-10-CM | POA: Diagnosis not present

## 2015-02-04 DIAGNOSIS — E119 Type 2 diabetes mellitus without complications: Secondary | ICD-10-CM | POA: Diagnosis not present

## 2015-02-04 DIAGNOSIS — Z6841 Body Mass Index (BMI) 40.0 and over, adult: Secondary | ICD-10-CM | POA: Diagnosis not present

## 2015-02-04 DIAGNOSIS — I340 Nonrheumatic mitral (valve) insufficiency: Secondary | ICD-10-CM | POA: Diagnosis not present

## 2015-02-04 DIAGNOSIS — I517 Cardiomegaly: Secondary | ICD-10-CM | POA: Diagnosis not present

## 2015-02-04 DIAGNOSIS — I5189 Other ill-defined heart diseases: Secondary | ICD-10-CM | POA: Diagnosis not present

## 2015-02-04 DIAGNOSIS — E785 Hyperlipidemia, unspecified: Secondary | ICD-10-CM | POA: Diagnosis not present

## 2015-02-06 ENCOUNTER — Telehealth: Payer: Self-pay | Admitting: *Deleted

## 2015-02-06 MED ORDER — LISINOPRIL 10 MG PO TABS: 10.0000 mg | ORAL_TABLET | Freq: Every day | ORAL | Status: DC

## 2015-02-06 NOTE — Telephone Encounter (Signed)
Notified the pt that per Dr Meda Coffee her echo showed that she has significantly increased LV wall thickness, as a result of HTN, and she recommends that the pt increase her lisinopril to 10 mg po daily.  Confirmed the pharmacy of choice with the pt.  Pt verbalized understanding and agrees with this plan.

## 2015-02-06 NOTE — Telephone Encounter (Signed)
-----   Message from Dorothy Spark, MD sent at 02/05/2015  9:51 AM EDT ----- She has significantly increased LV wall thickness as a result of hypertension. I would increase lisinopril to 10 mg po daily

## 2015-03-13 ENCOUNTER — Encounter: Payer: Self-pay | Admitting: Pharmacist

## 2015-03-13 ENCOUNTER — Ambulatory Visit (INDEPENDENT_AMBULATORY_CARE_PROVIDER_SITE_OTHER): Payer: Self-pay | Admitting: Family Medicine

## 2015-03-13 VITALS — BP 134/79 | Wt 299.0 lb

## 2015-03-13 DIAGNOSIS — E119 Type 2 diabetes mellitus without complications: Secondary | ICD-10-CM

## 2015-03-13 NOTE — Patient Instructions (Signed)
1)  Resume healthy diet, smoothies, limiting carbs, and limiting fast food 2)  Resume exercising 2-3 days per week, using stationary bike and treadmill.   3)  Continue stretches and leg exercises 3)  Resume testing once daily 5)  Follow-up in 3 months on Thursday Feb 23rd @ 8:30 pm  Great to see you today!

## 2015-03-13 NOTE — Progress Notes (Signed)
Subjective:  Patient is a 59 yo female with type 2 diabetes who presents today for 3 month follow-up as part of the employer-sponsored [facility] program. Current diabetes regimen includes Metformin ER 500 mg daily. Patient also continues on daily ASA, ACEi, and statin. Most recent MD follow-up was Sept 2016. Patient has a pending appt for Jan 2017.  Patient continues doing well after TKR this past Sept and October; however, she reports continuing to struggle with iliotibial band in her right leg from hip to knee.  Of note, patient recently suffered a depressive episode.  She decided to request antidepressant from her MD and started this about 3-4 weeks ago.  She reports significant improvement since starting the medication and feels she can resume a more normal lifestyle now that she is being treated.     Diabetes Assessment:  No change to diabetes regimen at this time.  Patient does maintain good medication compliance, even during recent depression she was able to maintain med compliance. Most recent A1c was 6.2% (recently 6.5%) which is further improved and at goal of less than 7%.  Patient will have repeat A1c done at upcoming PCP office visit.  Weight has remained unchanged since last visit.  Patient did not bring meter today as she is not currently testing, but she feels that she can resume daily testing now that she is recovering from depressive episode.  Patient is up to date on eye and dental exams.  No signs of neuropathy or infection at this time.   Lifestyle Assessment:  Diet - Diet has deteriorated recently due to various life stressors and working overtime.  As a result she has abandoned healthy eating habits and has opted for fast food on a regular basis.  She has made a decisiont o Going back on smoothies.  Healthier choices, limit fast food.  Not cooking during depression episode and long work hours.  Has set a goal to reduce carbs and fast food.    Exercise - No exercise at this  time.  Although patient has made a committment, starting last week, to resume using stationary bike and treadmill for 15 minutes, 2-3 times per week.  She reports this does help reduce soreness and pain due to iliotibial band.  Stretches, using leg weights.  Resume treadmill 20 minutes each time. Wants to start back 2-3 times weekly and work up to 4 times weekly.  Also wants to use stationary bike, 5 min at a time.  Also has a fitness program for [employer].     Plan and Goals: 1)  Resume healthy diet, smoothies, limiting carbs, and limiting fast food 2)  Resume exercising 2-3 days per week, using stationary bike and treadmill.   3)  Continue stretches and leg exercises 3)  Resume testing once daily 5)  Follow-up in 3 months on Thursday Feb 23rd @ 8:30 pm  Great to see you today!   Tilman Neat, PharmD [facility]  [phone]

## 2015-03-20 NOTE — Progress Notes (Signed)
I have reviewed this pharmacist's note and agree  

## 2015-04-21 MED FILL — FLUTICASONE PROP 50 MCG SPR: 50 | 90 days supply | Qty: 48 | Fill #0

## 2015-05-02 DIAGNOSIS — E039 Hypothyroidism, unspecified: Secondary | ICD-10-CM | POA: Diagnosis not present

## 2015-05-02 DIAGNOSIS — R002 Palpitations: Secondary | ICD-10-CM | POA: Diagnosis not present

## 2015-05-02 DIAGNOSIS — G4733 Obstructive sleep apnea (adult) (pediatric): Secondary | ICD-10-CM | POA: Diagnosis not present

## 2015-05-02 DIAGNOSIS — Z800 Family history of malignant neoplasm of digestive organs: Secondary | ICD-10-CM | POA: Diagnosis not present

## 2015-05-02 DIAGNOSIS — E559 Vitamin D deficiency, unspecified: Secondary | ICD-10-CM | POA: Diagnosis not present

## 2015-05-02 DIAGNOSIS — F329 Major depressive disorder, single episode, unspecified: Secondary | ICD-10-CM | POA: Diagnosis not present

## 2015-05-02 DIAGNOSIS — E119 Type 2 diabetes mellitus without complications: Secondary | ICD-10-CM | POA: Diagnosis not present

## 2015-05-02 DIAGNOSIS — K589 Irritable bowel syndrome without diarrhea: Secondary | ICD-10-CM | POA: Diagnosis not present

## 2015-05-02 DIAGNOSIS — K2270 Barrett's esophagus without dysplasia: Secondary | ICD-10-CM | POA: Diagnosis not present

## 2015-05-02 DIAGNOSIS — E782 Mixed hyperlipidemia: Secondary | ICD-10-CM | POA: Diagnosis not present

## 2015-05-02 DIAGNOSIS — K219 Gastro-esophageal reflux disease without esophagitis: Secondary | ICD-10-CM | POA: Diagnosis not present

## 2015-05-02 DIAGNOSIS — I10 Essential (primary) hypertension: Secondary | ICD-10-CM | POA: Diagnosis not present

## 2015-05-12 MED FILL — LISINOPRIL 10 MG TABLET: 10 | 90 days supply | Qty: 90 | Fill #1

## 2015-05-16 MED FILL — HYDROCODON-APAP 5-325: 5-325 | 15 days supply | Qty: 30 | Fill #0

## 2015-05-16 MED FILL — DICYCLOMINE 10 MG CAPSULE: 10 | 30 days supply | Qty: 30 | Fill #0

## 2015-05-23 MED FILL — METFORMIN HCL ER 500 MG TAB: 500 | 90 days supply | Qty: 90 | Fill #0

## 2015-05-28 MED FILL — ONE TOUCH DELICA 33G LANCET: 90 days supply | Qty: 100 | Fill #1

## 2015-05-28 MED FILL — CARTIA XT 120 MG CAPSULE: 120 | 90 days supply | Qty: 90 | Fill #0

## 2015-05-28 MED FILL — HYDROCHLOROTHIAZIDE 12.5 MG: 12.5 | 90 days supply | Qty: 90 | Fill #0

## 2015-06-05 ENCOUNTER — Ambulatory Visit: Payer: 59 | Admitting: Pharmacist

## 2015-06-05 DIAGNOSIS — G4733 Obstructive sleep apnea (adult) (pediatric): Secondary | ICD-10-CM | POA: Diagnosis not present

## 2015-06-09 MED FILL — CLOBETASOL 0.05% SOLUTION: 0.05 | 14 days supply | Qty: 50 | Fill #0

## 2015-06-09 MED FILL — KETOCONAZOLE 2% SHAMPOO: 2 | 30 days supply | Qty: 120 | Fill #0

## 2015-06-09 MED FILL — PANTOPRAZOLE SOD DR 40 MG T: 40 | 90 days supply | Qty: 90 | Fill #0

## 2015-06-13 ENCOUNTER — Other Ambulatory Visit: Payer: Self-pay

## 2015-06-13 DIAGNOSIS — Z1231 Encounter for screening mammogram for malignant neoplasm of breast: Secondary | ICD-10-CM

## 2015-06-26 DIAGNOSIS — G4733 Obstructive sleep apnea (adult) (pediatric): Secondary | ICD-10-CM | POA: Diagnosis not present

## 2015-06-30 MED FILL — ATORVASTATIN 10 MG TABLET: 10 | 90 days supply | Qty: 90 | Fill #0

## 2015-06-30 MED FILL — SYNTHROID 175 MCG TABLET: 175 | 84 days supply | Qty: 78 | Fill #0

## 2015-06-30 MED FILL — VALACYCLOVIR HCL 500 MG TAB: 500 | 90 days supply | Qty: 90 | Fill #0

## 2015-07-04 ENCOUNTER — Ambulatory Visit
Admission: RE | Admit: 2015-07-04 | Discharge: 2015-07-04 | Disposition: A | Discharge status: Home / Self Care | Payer: 59 | Source: Ambulatory Visit

## 2015-07-04 DIAGNOSIS — Z1231 Encounter for screening mammogram for malignant neoplasm of breast: Secondary | ICD-10-CM | POA: Diagnosis not present

## 2015-07-04 DIAGNOSIS — M79652 Pain in left thigh: Secondary | ICD-10-CM | POA: Diagnosis not present

## 2015-07-04 DIAGNOSIS — R300 Dysuria: Secondary | ICD-10-CM | POA: Diagnosis not present

## 2015-07-08 MED FILL — GAVILYTE-N SOLUTION: 420 | 1 days supply | Qty: 4000 | Fill #0

## 2015-07-11 ENCOUNTER — Encounter (HOSPITAL_COMMUNITY): Payer: Self-pay | Admitting: *Deleted

## 2015-07-17 ENCOUNTER — Other Ambulatory Visit: Payer: Self-pay | Admitting: Gastroenterology

## 2015-07-18 ENCOUNTER — Encounter (HOSPITAL_COMMUNITY)
Admission: RE | Disposition: A | Discharge status: Home / Self Care | Payer: Self-pay | Source: Ambulatory Visit | Attending: Gastroenterology

## 2015-07-18 ENCOUNTER — Ambulatory Visit (HOSPITAL_COMMUNITY): Payer: 59 | Admitting: Anesthesiology

## 2015-07-18 ENCOUNTER — Encounter (HOSPITAL_COMMUNITY): Payer: Self-pay | Admitting: Anesthesiology

## 2015-07-18 ENCOUNTER — Ambulatory Visit (HOSPITAL_COMMUNITY)
Admission: RE | Admit: 2015-07-18 | Discharge: 2015-07-18 | Disposition: A | Discharge status: Home / Self Care | Payer: 59 | Source: Ambulatory Visit | Attending: Gastroenterology | Admitting: Gastroenterology

## 2015-07-18 DIAGNOSIS — K219 Gastro-esophageal reflux disease without esophagitis: Secondary | ICD-10-CM | POA: Diagnosis not present

## 2015-07-18 DIAGNOSIS — G4730 Sleep apnea, unspecified: Secondary | ICD-10-CM | POA: Insufficient documentation

## 2015-07-18 DIAGNOSIS — Z800 Family history of malignant neoplasm of digestive organs: Secondary | ICD-10-CM | POA: Insufficient documentation

## 2015-07-18 DIAGNOSIS — M1990 Unspecified osteoarthritis, unspecified site: Secondary | ICD-10-CM | POA: Insufficient documentation

## 2015-07-18 DIAGNOSIS — Z79891 Long term (current) use of opiate analgesic: Secondary | ICD-10-CM | POA: Insufficient documentation

## 2015-07-18 DIAGNOSIS — G8929 Other chronic pain: Secondary | ICD-10-CM | POA: Insufficient documentation

## 2015-07-18 DIAGNOSIS — E119 Type 2 diabetes mellitus without complications: Secondary | ICD-10-CM | POA: Insufficient documentation

## 2015-07-18 DIAGNOSIS — Z87891 Personal history of nicotine dependence: Secondary | ICD-10-CM | POA: Diagnosis not present

## 2015-07-18 DIAGNOSIS — K449 Diaphragmatic hernia without obstruction or gangrene: Secondary | ICD-10-CM | POA: Diagnosis not present

## 2015-07-18 DIAGNOSIS — Z9989 Dependence on other enabling machines and devices: Secondary | ICD-10-CM | POA: Diagnosis not present

## 2015-07-18 DIAGNOSIS — Z7984 Long term (current) use of oral hypoglycemic drugs: Secondary | ICD-10-CM | POA: Diagnosis not present

## 2015-07-18 DIAGNOSIS — I10 Essential (primary) hypertension: Secondary | ICD-10-CM | POA: Insufficient documentation

## 2015-07-18 DIAGNOSIS — D123 Benign neoplasm of transverse colon: Secondary | ICD-10-CM | POA: Insufficient documentation

## 2015-07-18 DIAGNOSIS — Z7951 Long term (current) use of inhaled steroids: Secondary | ICD-10-CM | POA: Insufficient documentation

## 2015-07-18 DIAGNOSIS — K635 Polyp of colon: Secondary | ICD-10-CM | POA: Diagnosis not present

## 2015-07-18 DIAGNOSIS — Z79899 Other long term (current) drug therapy: Secondary | ICD-10-CM | POA: Insufficient documentation

## 2015-07-18 DIAGNOSIS — J309 Allergic rhinitis, unspecified: Secondary | ICD-10-CM | POA: Insufficient documentation

## 2015-07-18 DIAGNOSIS — Z96653 Presence of artificial knee joint, bilateral: Secondary | ICD-10-CM | POA: Diagnosis not present

## 2015-07-18 DIAGNOSIS — E039 Hypothyroidism, unspecified: Secondary | ICD-10-CM | POA: Diagnosis not present

## 2015-07-18 DIAGNOSIS — Z1211 Encounter for screening for malignant neoplasm of colon: Secondary | ICD-10-CM | POA: Diagnosis not present

## 2015-07-18 DIAGNOSIS — K589 Irritable bowel syndrome without diarrhea: Secondary | ICD-10-CM | POA: Diagnosis not present

## 2015-07-18 DIAGNOSIS — K5730 Diverticulosis of large intestine without perforation or abscess without bleeding: Secondary | ICD-10-CM | POA: Diagnosis not present

## 2015-07-18 DIAGNOSIS — Z6841 Body Mass Index (BMI) 40.0 and over, adult: Secondary | ICD-10-CM | POA: Diagnosis not present

## 2015-07-18 DIAGNOSIS — Z7982 Long term (current) use of aspirin: Secondary | ICD-10-CM | POA: Insufficient documentation

## 2015-07-18 DIAGNOSIS — M549 Dorsalgia, unspecified: Secondary | ICD-10-CM | POA: Insufficient documentation

## 2015-07-18 HISTORY — PX: ESOPHAGOGASTRODUODENOSCOPY: SHX5428

## 2015-07-18 HISTORY — PX: COLONOSCOPY: SHX5424

## 2015-07-18 LAB — GLUCOSE, CAPILLARY: Glucose-Capillary: 91 mg/dL (ref 65–99)

## 2015-07-18 SURGERY — EGD (ESOPHAGOGASTRODUODENOSCOPY)
Anesthesia: Monitor Anesthesia Care

## 2015-07-18 MED ORDER — LACTATED RINGERS IV SOLN
INTRAVENOUS | Status: DC
  Administered 2015-07-18: 1000 mL via INTRAVENOUS

## 2015-07-18 MED ORDER — PROPOFOL 10 MG/ML IV BOLUS
INTRAVENOUS | Status: AC
  Filled 2015-07-18: qty 60

## 2015-07-18 MED ORDER — LIDOCAINE HCL (CARDIAC) 20 MG/ML IV SOLN
INTRAVENOUS | Status: DC | PRN
  Administered 2015-07-18: 100 mg via INTRAVENOUS

## 2015-07-18 MED ORDER — MEPERIDINE HCL 100 MG/ML IJ SOLN: 6.2500 mg | INTRAMUSCULAR | Status: DC | PRN

## 2015-07-18 MED ORDER — PROPOFOL 10 MG/ML IV BOLUS
INTRAVENOUS | Status: AC
  Filled 2015-07-18: qty 20

## 2015-07-18 MED ORDER — LIDOCAINE HCL (CARDIAC) 20 MG/ML IV SOLN
INTRAVENOUS | Status: AC
  Filled 2015-07-18: qty 5

## 2015-07-18 MED ORDER — METOCLOPRAMIDE HCL 5 MG/ML IJ SOLN: 10.0000 mg | Freq: Once | INTRAMUSCULAR | Status: DC | PRN

## 2015-07-18 MED ORDER — PROPOFOL 500 MG/50ML IV EMUL
INTRAVENOUS | Status: DC | PRN
  Administered 2015-07-18: 300 ug/kg/min via INTRAVENOUS

## 2015-07-18 MED ORDER — GLYCOPYRROLATE 0.2 MG/ML IJ SOLN
INTRAMUSCULAR | Status: AC
  Filled 2015-07-18: qty 1

## 2015-07-18 MED ORDER — GLYCOPYRROLATE 0.2 MG/ML IJ SOLN
INTRAMUSCULAR | Status: DC | PRN
  Administered 2015-07-18: 0.2 mg via INTRAVENOUS

## 2015-07-18 NOTE — H&P (Signed)
[facility] Admission History & Physical  Chief Complaint: Patient here for EGD and colonoscopy HPI: Melanie Hood is an 60 y.o. black female.  With a history of questionable Barrett's esophagus and a family history of colon cancer who presents for EGD and colonoscopy. She is asymptomatic.  Past Medical History  Diagnosis Date  . Hypothyroidism   . GERD (gastroesophageal reflux disease)   . Diverticular disease of colon   . Arthritis   . Vein, varicose   . Heart disease   . Hypertension     mild left vent dysfunction   . Diabetes mellitus     type 11   . Graves disease     status post radioactive iodine treatment  . Allergic rhinitis   . Obesity   . Chronic back pain   . Exercise-induced asthma   . Sleep apnea     cpap-automatic settings    Past Surgical History  Procedure Laterality Date  . Appendectomy    . Cholecystectomy    . Abdominal hysterectomy    . Varicose vein surgery    . Foot arthroplasty      rt foot as child  . Upper gastrointestinal endoscopy    . Knee arthroscopy  04/20/2011    Procedure: ARTHROSCOPY KNEE;  Surgeon: Hessie Dibble, MD;  Location: [place];  Service: Orthopedics;  Laterality: Right;  right knee arthroscopy partial medial menisectomy and chondroplasty.  . Total knee arthroplasty Right 12/11/2013    Procedure: TOTAL KNEE ARTHROPLASTY;  Surgeon: Hessie Dibble, MD;  Location: [place];  Service: Orthopedics;  Laterality: Right;  . Eye surgery    . Total knee arthroplasty Left 02/05/2014    Procedure: LEFT TOTAL KNEE ARTHROPLASTY;  Surgeon: Hessie Dibble, MD;  Location: [place];  Service: Orthopedics;  Laterality: Left;    Medications Prior to Admission  Medication Sig Dispense Refill  . acetaminophen (TYLENOL) 500 MG tablet Take 1,000 mg by mouth every 6 (six) hours as needed for moderate pain.    Marland Kitchen albuterol (PROVENTIL HFA;VENTOLIN HFA) 108 (90 BASE) MCG/ACT inhaler Inhale 2 puffs into the lungs every 6 (six)  hours as needed for wheezing or shortness of breath.    Marland Kitchen aspirin 81 MG tablet Take 81 mg by mouth every evening.     Marland Kitchen atorvastatin (LIPITOR) 10 MG tablet Take 10 mg by mouth every evening.     . Biotin 5000 MCG CAPS Take 5,000 mcg by mouth every morning.     . cetirizine (ZYRTEC) 10 MG tablet Take 10 mg by mouth every evening.     . Cholecalciferol (VITAMIN D-3) 5000 UNITS TABS Take 5,000-10,000 Units by mouth every evening. Alternates taking 5000 units one day and 10,000 the next.    . Chromium-Cinnamon [phone] MCG-MG CAPS Take 2 capsules by mouth every morning.    . citalopram (CELEXA) 20 MG tablet Take 20 mg by mouth every evening.     . dicyclomine (BENTYL) 10 MG capsule Take 10 mg by mouth every 6 (six) hours as needed for spasms.     Marland Kitchen diltiazem (CARDIZEM LA) 120 MG 24 hr tablet Take 120 mg by mouth every evening.     . fluticasone (FLONASE) 50 MCG/ACT nasal spray Place 2 sprays into both nostrils every morning.     . furosemide (LASIX) 40 MG tablet Take 1 tablet (40 mg total) by mouth daily. As needed for swelling (Patient taking differently: Take 40 mg by mouth daily as needed for  fluid or edema. ) 30 tablet 3  . hydrochlorothiazide (MICROZIDE) 12.5 MG capsule Take 12.5 mg by mouth every evening.     Marland Kitchen HYDROcodone-acetaminophen (NORCO/VICODIN) 5-325 MG tablet Take 1 tablet by mouth every 6 (six) hours as needed for moderate pain.    Marland Kitchen levothyroxine (SYNTHROID, LEVOTHROID) 175 MCG tablet Take 87.5-175 mcg by mouth daily before breakfast. Take a whole tablet (175) mon-sat and Sunday take a half tablet (87.5)    . lisinopril (PRINIVIL,ZESTRIL) 10 MG tablet Take 1 tablet (10 mg total) by mouth daily. (Patient taking differently: Take 10 mg by mouth at bedtime. ) 90 tablet 3  . LORazepam (ATIVAN) 0.5 MG tablet Take 0.25-0.5 mg by mouth 2 (two) times daily as needed for anxiety.     . metFORMIN (GLUMETZA) 500 MG (MOD) 24 hr tablet Take 500 mg by mouth every evening.    . Multiple Vitamin  (MULTIVITAMIN WITH MINERALS) TABS tablet Take 1 tablet by mouth every morning.    . pantoprazole (PROTONIX) 40 MG tablet Take 40 mg by mouth every evening.     . therapeutic multivitamin-minerals (THERAGRAN-M) tablet Take 1 tablet by mouth every morning.     . traMADol (ULTRAM) 50 MG tablet Take 50-75 mg by mouth 2 (two) times daily as needed for moderate pain.     . valACYclovir (VALTREX) 500 MG tablet Take 500 mg by mouth every evening.     Marland Kitchen HYDROcodone-acetaminophen (NORCO) 10-325 MG per tablet Take 1-2 tablets by mouth every 6 (six) hours as needed for moderate pain or severe pain. (Patient not taking: Reported on 03/13/2015) 50 tablet 0  . methocarbamol (ROBAXIN) 750 MG tablet Take 1 tablet (750 mg total) by mouth every 6 (six) hours as needed for muscle spasms. (Patient not taking: Reported on 03/13/2015) 50 tablet 0    Allergies:  Allergies  Allergen Reactions  . Septra [Sulfamethoxazole W/Trimethoprim (Co-Trimoxazole)] Other (See Comments)    Ht rate up  . Metoprolol Tartrate Other (See Comments)    Memory loss/ panic attacks  . Minocycline Nausea And Vomiting  . Codeine Other (See Comments)    hallucinate  . Erythromycin Other (See Comments)    abd pain  . Nsaids Nausea And Vomiting  . Septra [Sulfamethoxazole-Trimethoprim] Palpitations    Heart rate, breathing went up.   . Tetracyclines & Related Nausea And Vomiting    Family History  Problem Relation Age of Onset  . Diabetes Mother   . Diabetes Father   . Colon cancer Sister     Social History:  reports that she quit smoking about 29 years ago. She does not have any smokeless tobacco history on file. She reports that she drinks alcohol. She reports that she does not use illicit drugs.  Review of Systems: negative except As above   Blood pressure 144/75, pulse 67, temperature 98.1 F (36.7 C), temperature source Oral, resp. rate 20, height 5\' 10"  (1.778 m), weight 136.079 kg (300 lb), SpO2 99 %. Head: Normocephalic,  without obvious abnormality, atraumatic Neck: no adenopathy, no carotid bruit, no JVD, supple, symmetrical, trachea midline and thyroid not enlarged, symmetric, no tenderness/mass/nodules Resp: clear to auscultation bilaterally Cardio: regular rate and rhythm, S1, S2 normal, no murmur, click, rub or gallop GI: Abdomen soft nondistended Extremities: extremities normal, atraumatic, no cyanosis or edema  Results for orders placed or performed during the hospital encounter of 07/18/15 (from the past 48 hour(s))  Glucose, capillary     Status: None   Collection Time: 07/18/15  7:45  AM  Result Value Ref Range   Glucose-Capillary 91 65 - 99 mg/dL   No results found.  Assessment: Barrett's esophagus Family history of colon cancer  Plan: Proceed with EGD and colonoscopy  Lunna Vogelgesang C 07/18/2015, 8:17 AM

## 2015-07-18 NOTE — Anesthesia Preprocedure Evaluation (Addendum)
Anesthesia Evaluation  Patient identified by MRN, date of birth, ID band Patient awake    Reviewed: Allergy & Precautions, NPO status , Patient's Chart, lab work & pertinent test results  Airway Mallampati: III  TM Distance: >3 FB Neck ROM: Full    Dental no notable dental hx. (+) Teeth Intact,    Pulmonary asthma , sleep apnea and Continuous Positive Airway Pressure Ventilation , former smoker,    Pulmonary exam normal breath sounds clear to auscultation       Cardiovascular hypertension, Normal cardiovascular exam Rhythm:Regular Rate:Normal     Neuro/Psych negative neurological ROS  negative psych ROS   GI/Hepatic GERD  Medicated,Barrett's Esophagus IBS Diverticular disease   Endo/Other  diabetes, Well Controlled, Type 2, Oral Hypoglycemic AgentsHypothyroidism Hyperthyroidism Morbid obesityHx/o Grave's Disease S/P RAI  Renal/GU   negative genitourinary   Musculoskeletal  (+) Arthritis , Osteoarthritis,  Chronic LBP   Abdominal (+) + obese,   Peds  Hematology  (+) anemia ,   Anesthesia Other Findings   Reproductive/Obstetrics                           Anesthesia Physical Anesthesia Plan  ASA: III  Anesthesia Plan: MAC   Post-op Pain Management:    Induction: Intravenous  Airway Management Planned: Nasal Cannula  Additional Equipment:   Intra-op Plan:   Post-operative Plan:   Informed Consent: I have reviewed the patients History and Physical, chart, labs and discussed the procedure including the risks, benefits and alternatives for the proposed anesthesia with the patient or authorized representative who has indicated his/her understanding and acceptance.     Plan Discussed with: CRNA, Anesthesiologist and Surgeon  Anesthesia Plan Comments:         Anesthesia Quick Evaluation

## 2015-07-18 NOTE — Op Note (Signed)
[facility] Patient Name: Melanie Hood Procedure Date: 07/18/2015 MRN: [id number] Attending MD: Missy Sabins , MD Date of Birth: [birth year] CSN:  Age: 60 Admit Type: Outpatient Procedure:                Upper GI endoscopy Indications:              Exclusion of Barrett's esophagus Providers:                Elyse Jarvis. Amedeo Plenty, MD, Laverta Baltimore, RN Referring MD:              Medicines:                Propofol per Anesthesia Complications:            No immediate complications. Estimated Blood Loss:     Estimated blood loss: none. Procedure:                Pre-Anesthesia Assessment:                           - Prior to the procedure, a History and Physical                            was performed, and patient medications and                            allergies were reviewed. The patient's tolerance of                            previous anesthesia was also reviewed. The risks                            and benefits of the procedure and the sedation                            options and risks were discussed with the patient.                            All questions were answered, and informed consent                            was obtained. Prior Anticoagulants: The patient has                            taken no previous anticoagulant or antiplatelet                            agents. ASA Grade Assessment: II - A patient with                            mild systemic disease. After reviewing the risks                            and benefits, the patient was deemed in  satisfactory condition to undergo the procedure.                           After obtaining informed consent, the endoscope was                            passed under direct vision. Throughout the                            procedure, the patient's blood pressure, pulse, and                            oxygen saturations were monitored continuously. The   [id number]) scope was introduced through the                            mouth, and advanced to the second part of duodenum.                            The upper GI endoscopy was accomplished without                            difficulty. The patient tolerated the procedure                            well. Scope In: Scope Out: Findings:      A small hiatal hernia was present.      The entire examined stomach was normal.      The examined duodenum was normal. Impression:               - Small hiatal hernia.                           - Normal stomach.                           - Normal examined duodenum.                           - No specimens collected. Moderate Sedation:      no moderate sedation Recommendation:           - Discharge patient to home (ambulatory).                           - Resume previous diet.                           - Continue present medications. Procedure Code(s):        --- Professional ---                           [phone], Esophagogastroduodenoscopy, flexible,                            transoral; diagnostic, including collection of  specimen(s) by brushing or washing, when performed                            (separate procedure) Diagnosis Code(s):        --- Professional ---                           K44.9, Diaphragmatic hernia without obstruction or                            gangrene CPT copyright 2016 [employer]. All rights reserved. The codes documented in this report are preliminary and upon coder review may  be revised to meet current compliance requirements. Teena Irani, MD Missy Sabins, MD 07/18/2015 8:30:54 AM This report has been signed electronically. Number of Addenda: 0

## 2015-07-18 NOTE — Op Note (Addendum)
[facility] Patient Name: Melanie Hood Procedure Date: 07/18/2015 MRN: [id number] Attending MD: Missy Sabins , MD Date of Birth: 05-17-1955 CSN:  Age: 60 Admit Type: Outpatient Procedure:                Colonoscopy Indications:              Screening in patient at increased risk: Family                            history of 1st-degree relative with colorectal                            cancer Providers:                Elyse Jarvis. Amedeo Plenty, MD, Laverta Baltimore, RN, William Dalton, Technician Referring MD:              Medicines:                Propofol per Anesthesia Complications:            No immediate complications. Estimated Blood Loss:     Estimated blood loss: none. Procedure:                Pre-Anesthesia Assessment:                           - Prior to the procedure, a History and Physical                            was performed, and patient medications and                            allergies were reviewed. The patient's tolerance of                            previous anesthesia was also reviewed. The risks                            and benefits of the procedure and the sedation                            options and risks were discussed with the patient.                            All questions were answered, and informed consent                            was obtained. Prior Anticoagulants: The patient has                            taken no previous anticoagulant or antiplatelet                            agents. ASA Grade Assessment: II -  A patient with                            mild systemic disease. After reviewing the risks                            and benefits, the patient was deemed in                            satisfactory condition to undergo the procedure.                           - Prior to the procedure, a History and Physical                            was performed, and patient medications and        allergies were reviewed. The patient's tolerance of                            previous anesthesia was also reviewed. The risks                            and benefits of the procedure and the sedation                            options and risks were discussed with the patient.                            All questions were answered, and informed consent                            was obtained. Prior Anticoagulants: The patient has                            taken no previous anticoagulant or antiplatelet                            agents. ASA Grade Assessment: II - A patient with                            mild systemic disease. After reviewing the risks                            and benefits, the patient was deemed in                            satisfactory condition to undergo the procedure.                           After obtaining informed consent, the colonoscope                            was passed under direct vision. Throughout the  procedure, the patient's blood pressure, pulse, and                            oxygen saturations were monitored continuously. The                            [id number]) scope was introduced through                            the anus and advanced to the the cecum, identified                            by appendiceal orifice and ileocecal valve. The                            colonoscopy was performed without difficulty. The                            patient tolerated the procedure well. The quality                            of the bowel preparation was good. The ileocecal                            valve, appendiceal orifice, and rectum were                            photographed. Scope In: 8:33:36 AM Scope Out: 8:44:00 AM Total Procedure Duration: 0 hours 10 minutes 24 seconds  Findings:      A 5 mm polyp was found in the transverse colon. The polyp was sessile.       The polyp was removed with a hot  snare. Resection and retrieval were       complete.      A few diverticula were found in the sigmoid colon.      The exam was otherwise without abnormality on direct and retroflexion       views. Impression:               - One 5 mm polyp in the transverse colon, removed                            with a hot snare. Resected and retrieved.                           - Diverticulosis in the sigmoid colon.                           - The examination was otherwise normal on direct                            and retroflexion views. Moderate Sedation:      no moderate sedation Recommendation:           - Patient has a contact number available for  emergencies. The signs and symptoms of potential                            delayed complications were discussed with the                            patient. Return to normal activities tomorrow.                            Written discharge instructions were provided to the                            patient.                           - Resume previous diet.                           - Continue present medications.                           - Repeat colonoscopy in 5 years for screening                            purposes.                           - Await pathology results. Procedure Code(s):        --- Professional ---                           [phone], Colonoscopy, flexible; with removal of                            tumor(s), polyp(s), or other lesion(s) by snare                            technique Diagnosis Code(s):        --- Professional ---                           D12.3, Benign neoplasm of transverse colon (hepatic                            flexure or splenic flexure)                           K57.30, Diverticulosis of large intestine without                            perforation or abscess without bleeding CPT copyright 2016 American Medical Association. All rights reserved. The codes documented in this report  are preliminary and upon coder review may  be revised to meet current compliance requirements. Teena Irani, MD Missy Sabins, MD 07/18/2015 8:49:14 AM This report has been signed electronically. Number of Addenda: 0

## 2015-07-18 NOTE — Discharge Instructions (Signed)
Monitored Anesthesia Care Monitored anesthesia care is an anesthesia service for a medical procedure. Anesthesia is the loss of the ability to feel pain. It is produced by medicines called anesthetics. It may affect a small area of your body (local anesthesia), a large area of your body (regional anesthesia), or your entire body (general anesthesia). The need for monitored anesthesia care depends your procedure, your condition, and the potential need for regional or general anesthesia. It is often provided during procedures where:   General anesthesia may be needed if there are complications. This is because you need special care when you are under general anesthesia.   You will be under local or regional anesthesia. This is so that you are able to have higher levels of anesthesia if needed.   You will receive calming medicines (sedatives). This is especially the case if sedatives are given to put you in a semi-conscious state of relaxation (deep sedation). This is because the amount of sedative needed to produce this state can be hard to predict. Too much of a sedative can produce general anesthesia. Monitored anesthesia care is performed by one or more health care providers who have special training in all types of anesthesia. You will need to meet with these health care providers before your procedure. During this meeting, they will ask you about your medical history. They will also give you instructions to follow. (For example, you will need to stop eating and drinking before your procedure. You may also need to stop or change medicines you are taking.) During your procedure, your health care providers will stay with you. They will:   Watch your condition. This includes watching your blood pressure, breathing, and level of pain.   Diagnose and treat problems that occur.   Give medicines if they are needed. These may include calming medicines (sedatives) and anesthetics.   Make sure you are  comfortable.  Having monitored anesthesia care does not necessarily mean that you will be under anesthesia. It does mean that your health care providers will be able to manage anesthesia if you need it or if it occurs. It also means that you will be able to have a different type of anesthesia than you are having if you need it. When your procedure is complete, your health care providers will continue to watch your condition. They will make sure any medicines wear off before you are allowed to go home.    This information is not intended to replace advice given to you by your health care provider. Make sure you discuss any questions you have with your health care provider.   Document Released: 12/23/2004 Document Revised: 04/19/2014 Document Reviewed: 05/10/2012 Elsevier Interactive Patient Education 2016 [employer].  Esophagogastroduodenoscopy, Care After Refer to this sheet in the next few weeks. These instructions provide you with information about caring for yourself after your procedure. Your health care provider may also give you more specific instructions. Your treatment has been planned according to current medical practices, but problems sometimes occur. Call your health care provider if you have any problems or questions after your procedure. WHAT TO EXPECT AFTER THE PROCEDURE After your procedure, it is typical to feel:  Soreness in your throat.  Pain with swallowing.  Sick to your stomach (nauseous).  Bloated.  Dizzy.  Fatigued. HOME CARE INSTRUCTIONS  Do not eat or drink anything until the numbing medicine (local anesthetic) has worn off and your gag reflex has returned. You will know that the local anesthetic has worn  off when you can swallow comfortably.  Do not drive or operate machinery until directed by your health care provider.  Take medicines only as directed by your health care provider. SEEK MEDICAL CARE IF:   You cannot stop coughing.  You are not  urinating at all or less than usual. SEEK IMMEDIATE MEDICAL CARE IF:  You have difficulty swallowing.  You cannot eat or drink.  You have worsening throat or chest pain.  You have dizziness or lightheadedness or you faint.  You have nausea or vomiting.  You have chills.  You have a fever.  You have severe abdominal pain.  You have black, tarry, or bloody stools.   This information is not intended to replace advice given to you by your health care provider. Make sure you discuss any questions you have with your health care provider.   Document Released: 03/15/2012 Document Revised: 04/19/2014 Document Reviewed: 03/15/2012 Elsevier Interactive Patient Education 2016 [employer] Inc[employer]  Colonoscopy, Care After Refer to this sheet in the next few weeks. These instructions provide you with information on caring for yourself after your procedure. Your health care provider may also give you more specific instructions. Your treatment has been planned according to current medical practices, but problems sometimes occur. Call your health care provider if you have any problems or questions after your procedure. WHAT TO EXPECT AFTER THE PROCEDURE  After your procedure, it is typical to have the following:  A small amount of blood in your stool.  Moderate amounts of gas and mild abdominal cramping or bloating. HOME CARE INSTRUCTIONS  Do not drive, operate machinery, or sign important documents for 24 hours.  You may shower and resume your regular physical activities, but move at a slower pace for the first 24 hours.  Take frequent rest periods for the first 24 hours.  Walk around or put a warm pack on your abdomen to help reduce abdominal cramping and bloating.  Drink enough fluids to keep your urine clear or pale yellow.  You may resume your normal diet as instructed by your health care provider. Avoid heavy or fried foods that are hard to digest.  Avoid drinking alcohol for 24 hours  or as instructed by your health care provider.  Only take over-the-counter or prescription medicines as directed by your health care provider.  If a tissue sample (biopsy) was taken during your procedure:  Do not take aspirin or blood thinners for 7 days, or as instructed by your health care provider.  Do not drink alcohol for 7 days, or as instructed by your health care provider.  Eat soft foods for the first 24 hours. SEEK MEDICAL CARE IF: You have persistent spotting of blood in your stool 2-3 days after the procedure. SEEK IMMEDIATE MEDICAL CARE IF:  You have more than a small spotting of blood in your stool.  You pass large blood clots in your stool.  Your abdomen is swollen (distended).  You have nausea or vomiting.  You have a fever.  You have increasing abdominal pain that is not relieved with medicine.   This information is not intended to replace advice given to you by your health care provider. Make sure you discuss any questions you have with your health care provider.   Document Released: 11/11/2003 Document Revised: 01/17/2013 Document Reviewed: 12/04/2012 Elsevier Interactive Patient Education [employer].

## 2015-07-18 NOTE — Transfer of Care (Signed)
Immediate Anesthesia Transfer of Care Note  Patient: LEEANNA POWLEY  Procedure(s) Performed: Procedure(s): ESOPHAGOGASTRODUODENOSCOPY (EGD) (N/A) COLONOSCOPY (N/A)  Patient Location: PACU  Anesthesia Type:MAC  Level of Consciousness: awake, alert , oriented and patient cooperative  Airway & Oxygen Therapy: Patient Spontanous Breathing and Patient connected to face mask oxygen  Post-op Assessment: Report given to RN, Post -op Vital signs reviewed and stable and Patient moving all extremities X 4  Post vital signs: stable  Last Vitals:  Filed Vitals:   07/18/15 0723  BP: 144/75  Pulse: 67  Temp: 36.7 C  Resp: 20    Complications: No apparent anesthesia complications

## 2015-07-18 NOTE — Anesthesia Postprocedure Evaluation (Signed)
Anesthesia Post Note  Patient: Melanie Hood  Procedure(s) Performed: Procedure(s) (LRB): ESOPHAGOGASTRODUODENOSCOPY (EGD) (N/A) COLONOSCOPY (N/A)  Patient location during evaluation: PACU Anesthesia Type: MAC Level of consciousness: awake and alert and oriented Pain management: pain level controlled Vital Signs Assessment: post-procedure vital signs reviewed and stable Respiratory status: spontaneous breathing, nonlabored ventilation and respiratory function stable Cardiovascular status: blood pressure returned to baseline and stable Postop Assessment: no signs of nausea or vomiting Anesthetic complications: no    Last Vitals:  Filed Vitals:   07/18/15 0723 07/18/15 0858  BP: 144/75 141/79  Pulse: 67 76  Temp: 36.7 C 36.4 C  Resp: 20 22    Last Pain: There were no vitals filed for this visit.               [name] A.

## 2015-07-22 ENCOUNTER — Encounter (HOSPITAL_COMMUNITY): Payer: Self-pay | Admitting: Gastroenterology

## 2015-07-22 MED FILL — LORazepam 0.5 MG TABS: 0.5 | 15 days supply | Qty: 30 | Fill #0

## 2015-07-22 MED FILL — CITALOPRAM HBR 20 MG TABLET: 20 | 90 days supply | Qty: 90 | Fill #0

## 2015-07-23 DIAGNOSIS — J4540 Moderate persistent asthma, uncomplicated: Secondary | ICD-10-CM | POA: Diagnosis not present

## 2015-07-23 DIAGNOSIS — J309 Allergic rhinitis, unspecified: Secondary | ICD-10-CM | POA: Diagnosis not present

## 2015-07-23 DIAGNOSIS — M25562 Pain in left knee: Secondary | ICD-10-CM | POA: Diagnosis not present

## 2015-07-23 MED FILL — traMADol HCL 50 MG TABS: 50 | 30 days supply | Qty: 90 | Fill #0

## 2015-07-23 MED FILL — FLOVENT HFA 110 MCG INHALER: 110 | 60 days supply | Qty: 12 | Fill #0

## 2015-08-04 DIAGNOSIS — M25562 Pain in left knee: Secondary | ICD-10-CM | POA: Diagnosis not present

## 2015-08-05 ENCOUNTER — Other Ambulatory Visit: Payer: Self-pay | Admitting: Family Medicine

## 2015-08-05 ENCOUNTER — Other Ambulatory Visit (HOSPITAL_COMMUNITY)
Admission: RE | Admit: 2015-08-05 | Discharge: 2015-08-05 | Disposition: A | Discharge status: Home / Self Care | Payer: 59 | Source: Ambulatory Visit | Attending: Family Medicine | Admitting: Family Medicine

## 2015-08-05 DIAGNOSIS — M5442 Lumbago with sciatica, left side: Secondary | ICD-10-CM | POA: Diagnosis not present

## 2015-08-05 DIAGNOSIS — Z0000 Encounter for general adult medical examination without abnormal findings: Secondary | ICD-10-CM | POA: Diagnosis not present

## 2015-08-05 DIAGNOSIS — E559 Vitamin D deficiency, unspecified: Secondary | ICD-10-CM | POA: Diagnosis not present

## 2015-08-05 DIAGNOSIS — Z01419 Encounter for gynecological examination (general) (routine) without abnormal findings: Secondary | ICD-10-CM | POA: Insufficient documentation

## 2015-08-05 DIAGNOSIS — K219 Gastro-esophageal reflux disease without esophagitis: Secondary | ICD-10-CM | POA: Diagnosis not present

## 2015-08-05 DIAGNOSIS — N760 Acute vaginitis: Secondary | ICD-10-CM | POA: Diagnosis not present

## 2015-08-05 DIAGNOSIS — Z1151 Encounter for screening for human papillomavirus (HPV): Secondary | ICD-10-CM | POA: Diagnosis not present

## 2015-08-05 DIAGNOSIS — I10 Essential (primary) hypertension: Secondary | ICD-10-CM | POA: Diagnosis not present

## 2015-08-05 DIAGNOSIS — E119 Type 2 diabetes mellitus without complications: Secondary | ICD-10-CM | POA: Diagnosis not present

## 2015-08-05 DIAGNOSIS — F329 Major depressive disorder, single episode, unspecified: Secondary | ICD-10-CM | POA: Diagnosis not present

## 2015-08-05 DIAGNOSIS — K589 Irritable bowel syndrome without diarrhea: Secondary | ICD-10-CM | POA: Diagnosis not present

## 2015-08-05 DIAGNOSIS — E039 Hypothyroidism, unspecified: Secondary | ICD-10-CM | POA: Diagnosis not present

## 2015-08-05 MED FILL — GABAPENTIN 300 MG CAPSULE: 300 | 30 days supply | Qty: 90 | Fill #0

## 2015-08-05 MED FILL — HYDROCODON-APAP 5-325: 5-325 | 20 days supply | Qty: 60 | Fill #0

## 2015-08-05 MED FILL — raNITIdine HCL 150 MG TABS: 150 | 90 days supply | Qty: 90 | Fill #0

## 2015-08-07 LAB — CYTOLOGY - PAP

## 2015-08-13 ENCOUNTER — Other Ambulatory Visit: Payer: Self-pay | Admitting: Orthopaedic Surgery

## 2015-08-27 DIAGNOSIS — H524 Presbyopia: Secondary | ICD-10-CM | POA: Diagnosis not present

## 2015-08-27 DIAGNOSIS — H52223 Regular astigmatism, bilateral: Secondary | ICD-10-CM | POA: Diagnosis not present

## 2015-08-27 DIAGNOSIS — H5203 Hypermetropia, bilateral: Secondary | ICD-10-CM | POA: Diagnosis not present

## 2015-08-27 MED FILL — HYDROCHLOROTHIAZIDE 12.5 MG: 12.5 | 90 days supply | Qty: 90 | Fill #0 | Status: TO

## 2015-08-27 MED FILL — CLOBETASOL 0.05% SOLUTION: 0.05 | 14 days supply | Qty: 50 | Fill #0

## 2015-08-27 MED FILL — KETOCONAZOLE 2% SHAMPOO: 2 | 30 days supply | Qty: 120 | Fill #1

## 2015-08-27 MED FILL — LISINOPRIL 10 MG TABLET: 10 | 90 days supply | Qty: 90 | Fill #2 | Status: TO

## 2015-08-27 MED FILL — METFORMIN HCL ER 500 MG TAB: 500 | 90 days supply | Qty: 90 | Fill #0 | Status: TO

## 2015-08-27 MED FILL — FLUTICASONE PROP 50 MCG SPR: 50 | 90 days supply | Qty: 48 | Fill #0

## 2015-09-05 ENCOUNTER — Encounter (HOSPITAL_BASED_OUTPATIENT_CLINIC_OR_DEPARTMENT_OTHER): Admission: RE | Payer: Self-pay | Source: Ambulatory Visit

## 2015-09-05 ENCOUNTER — Ambulatory Visit (HOSPITAL_BASED_OUTPATIENT_CLINIC_OR_DEPARTMENT_OTHER): Admission: RE | Admit: 2015-09-05 | Payer: 59 | Source: Ambulatory Visit | Admitting: Orthopaedic Surgery

## 2015-09-05 SURGERY — EXCISION, SYNOVIAL CYST, POPLITEAL SPACE
Anesthesia: General | Site: Knee | Laterality: Left

## 2015-09-09 MED FILL — GABAPENTIN 300 MG CAPSULE: 300 | 30 days supply | Qty: 90 | Fill #0

## 2015-09-09 MED FILL — SYNTHROID 175 MCG TABLET: 175 | 84 days supply | Qty: 78 | Fill #1 | Status: TO

## 2015-09-09 MED FILL — CARTIA XT 120 MG CAPSULE: 120 | 90 days supply | Qty: 90 | Fill #0 | Status: TO

## 2015-09-09 MED FILL — traMADol HCL 50 MG TABS: 50 | 30 days supply | Qty: 90 | Fill #1 | Status: TO

## 2015-09-09 MED FILL — ATORVASTATIN 10 MG TABLET: 10 | 90 days supply | Qty: 90 | Fill #0 | Status: TO

## 2015-09-10 DIAGNOSIS — G4733 Obstructive sleep apnea (adult) (pediatric): Secondary | ICD-10-CM | POA: Diagnosis not present

## 2015-09-10 MED FILL — CLOBETASOL 0.05% SOLUTION: 0.05 | 14 days supply | Qty: 50 | Fill #1

## 2015-12-12 ENCOUNTER — Other Ambulatory Visit: Payer: Self-pay

## 2015-12-12 NOTE — Patient Outreach (Signed)
Link to [employer] closure due to patient failure to engage in program.  Deanne Coffer, PharmD, [place] [phone]

## 2016-02-13 ENCOUNTER — Ambulatory Visit: Payer: 59 | Admitting: Cardiology

## 2016-04-16 ENCOUNTER — Ambulatory Visit (INDEPENDENT_AMBULATORY_CARE_PROVIDER_SITE_OTHER): Payer: Commercial Managed Care - PPO | Admitting: Cardiology

## 2016-04-16 ENCOUNTER — Encounter (INDEPENDENT_AMBULATORY_CARE_PROVIDER_SITE_OTHER): Payer: Self-pay

## 2016-04-16 VITALS — BP 130/72 | HR 75 | Ht 70.0 in | Wt 326.0 lb

## 2016-04-16 DIAGNOSIS — I10 Essential (primary) hypertension: Secondary | ICD-10-CM

## 2016-04-16 DIAGNOSIS — E782 Mixed hyperlipidemia: Secondary | ICD-10-CM | POA: Diagnosis not present

## 2016-04-16 DIAGNOSIS — E7849 Other hyperlipidemia: Secondary | ICD-10-CM

## 2016-04-16 DIAGNOSIS — E784 Other hyperlipidemia: Secondary | ICD-10-CM

## 2016-04-16 DIAGNOSIS — R600 Localized edema: Secondary | ICD-10-CM

## 2016-04-16 NOTE — Patient Instructions (Signed)

## 2016-04-16 NOTE — Progress Notes (Signed)
Patient ID: Melanie Hood, female   DOB: 16-Sep-1955, 61 y.o.   MRN: [id number]      Cardiology Office Note  Date:  04/16/2016   ID:  Dulcinea, Ayad 1956-01-12, MRN [id number]  PCP:  Cari Caraway, MD  Cardiologist:  Ena Dawley, MD   History of Present Illness: Melanie Hood is a 61 y.o. female who presents to establish cardiology car. She has h/o obesity. DM, hypertension and hyperlipidemia who used to see a cardiologist at [facility]. She is a very motivated patient who exercises a lot, she works as a Marine scientist and is very educated about cardiac diet and exercise. In the past she lost 70 lbs, but then lost her grandson and was depressed.  She denies any CP, SOB, she has on and off LE edema, for which exercise helps. She denies palpitations, syncope.  04/16/2016 - December 1 in follow-up, patient seems to be in great spirits, she has quit a job at [employer] and feels much less stressed. She also started to visit nutritionist classes and change her diet and exercise habits and has lost 20 pounds so for. Her goal stools down to 160 pounds. With exercise she denies any chest pain or shortness of breath. She also has no palpitations she is minimal lower extremity edema orthopnea or proximal nocturnal dyspnea. She sleeps with a CPAP.    Past Medical History:  Diagnosis Date  . Allergic rhinitis   . Arthritis   . Chronic back pain   . Diabetes mellitus    type 11   . Diverticular disease of colon   . Exercise-induced asthma   . GERD (gastroesophageal reflux disease)   . Graves disease    status post radioactive iodine treatment  . Heart disease   . Hypertension    mild left vent dysfunction   . Hypothyroidism   . Obesity   . Sleep apnea    cpap-automatic settings  . Vein, varicose     Past Surgical History:  Procedure Laterality Date  . ABDOMINAL HYSTERECTOMY    . APPENDECTOMY    . CHOLECYSTECTOMY    . COLONOSCOPY N/A 07/18/2015   Procedure: COLONOSCOPY;  Surgeon: Teena Irani,  MD;  Location: [facility];  Service: Endoscopy;  Laterality: N/A;  . ESOPHAGOGASTRODUODENOSCOPY N/A 07/18/2015   Procedure: ESOPHAGOGASTRODUODENOSCOPY (EGD);  Surgeon: Teena Irani, MD;  Location: [facility];  Service: Endoscopy;  Laterality: N/A;  . EYE SURGERY    . FOOT ARTHROPLASTY     rt foot as child  . KNEE ARTHROSCOPY  04/20/2011   Procedure: ARTHROSCOPY KNEE;  Surgeon: Hessie Dibble, MD;  Location: [place];  Service: Orthopedics;  Laterality: Right;  right knee arthroscopy partial medial menisectomy and chondroplasty.  Marland Kitchen TOTAL KNEE ARTHROPLASTY Right 12/11/2013   Procedure: TOTAL KNEE ARTHROPLASTY;  Surgeon: Hessie Dibble, MD;  Location: [place];  Service: Orthopedics;  Laterality: Right;  . TOTAL KNEE ARTHROPLASTY Left 02/05/2014   Procedure: LEFT TOTAL KNEE ARTHROPLASTY;  Surgeon: Hessie Dibble, MD;  Location: [place];  Service: Orthopedics;  Laterality: Left;  . UPPER GASTROINTESTINAL ENDOSCOPY    . VARICOSE VEIN SURGERY       Current Outpatient Prescriptions  Medication Sig Dispense Refill  . acetaminophen (TYLENOL) 500 MG tablet Take 1,000 mg by mouth every 6 (six) hours as needed for moderate pain.    Marland Kitchen albuterol (PROVENTIL HFA;VENTOLIN HFA) 108 (90 BASE) MCG/ACT inhaler Inhale 2 puffs into the lungs every 6 (six) hours as needed  for wheezing or shortness of breath.    Marland Kitchen aspirin 81 MG tablet Take 81 mg by mouth every evening.     Marland Kitchen atorvastatin (LIPITOR) 10 MG tablet Take 10 mg by mouth every evening.     . Biotin w/ Vitamins C & E (HAIR/SKIN/NAILS PO) Take by mouth daily.    . cetirizine (ZYRTEC) 10 MG tablet Take 10 mg by mouth every evening.     . Cholecalciferol (VITAMIN D-3) 5000 UNITS TABS Take 5,000-10,000 Units by mouth every evening. Alternates taking 5000 units one day and 10,000 the next.    . Chromium-Cinnamon [phone] MCG-MG CAPS Take 2 capsules by mouth every morning.    . citalopram (CELEXA) 20 MG tablet Take 20 mg by mouth every evening.       Marland Kitchen CRANBERRY PO Take by mouth daily.    Marland Kitchen dicyclomine (BENTYL) 10 MG capsule Take 10 mg by mouth every 6 (six) hours as needed for spasms.     Marland Kitchen diltiazem (CARDIZEM LA) 120 MG 24 hr tablet Take 120 mg by mouth every evening.     . fluticasone (FLONASE) 50 MCG/ACT nasal spray Place 2 sprays into both nostrils every morning.     . furosemide (LASIX) 40 MG tablet Take 1 tablet (40 mg total) by mouth daily. As needed for swelling (Patient taking differently: Take 40 mg by mouth daily as needed for fluid or edema. ) 30 tablet 3  . hydrochlorothiazide (MICROZIDE) 12.5 MG capsule Take 12.5 mg by mouth every evening.     Marland Kitchen HYDROcodone-acetaminophen (NORCO/VICODIN) 5-325 MG tablet Take 1 tablet by mouth every 6 (six) hours as needed for moderate pain.    Marland Kitchen lisinopril (PRINIVIL,ZESTRIL) 10 MG tablet Take 1 tablet (10 mg total) by mouth daily. (Patient taking differently: Take 10 mg by mouth at bedtime. ) 90 tablet 3  . metFORMIN (GLUMETZA) 500 MG (MOD) 24 hr tablet Take 500 mg by mouth every evening.    . pantoprazole (PROTONIX) 40 MG tablet Take 40 mg by mouth every evening.     . therapeutic multivitamin-minerals (THERAGRAN-M) tablet Take 1 tablet by mouth every morning.     . traMADol (ULTRAM) 50 MG tablet Take 50-75 mg by mouth 2 (two) times daily as needed for moderate pain.     . valACYclovir (VALTREX) 500 MG tablet Take 500 mg by mouth every evening.      No current facility-administered medications for this visit.     Allergies:   Septra [sulfamethoxazole w/trimethoprim (co-trimoxazole)]; Metoprolol tartrate; Minocycline; Codeine; Erythromycin; Nsaids; Septra [sulfamethoxazole-trimethoprim]; and Tetracyclines & related    Social History:  The patient  reports that she quit smoking about 30 years ago. She does not have any smokeless tobacco history on file. She reports that she drinks alcohol. She reports that she does not use drugs.   Family History:  The patient's family history includes Colon  cancer in her sister; Diabetes in her father and mother.    ROS:  Please see the history of present illness.   Otherwise, review of systems are positive for none.   All other systems are reviewed and negative.    PHYSICAL EXAM: VS:  BP 130/72   Pulse 75   Ht 5\' 10"  (1.778 m)   Wt (!) 326 lb (147.9 kg)   BMI 46.78 kg/m  , BMI Body mass index is 46.78 kg/m. GEN: Well nourished, well developed, in no acute distress  HEENT: normal  Neck: no JVD, carotid bruits, or masses Cardiac: RRR;  no murmurs, rubs, or gallops,no edema  Respiratory:  clear to auscultation bilaterally, normal work of breathing GI: soft, nontender, nondistended, + BS MS: no deformity or atrophy  Skin: warm and dry, no rash Neuro:  Strength and sensation are intact Psych: euthymic mood, full affect   EKG:  EKG is ordered today. The ekg ordered today demonstrates SR, normal ECG   Recent Labs: No results found for requested labs within last 8760 hours.    Lipid Panel No results found for: CHOL, TRIG, HDL, CHOLHDL, VLDL, LDLCALC, LDLDIRECT    Wt Readings from Last 3 Encounters:  04/16/16 (!) 326 lb (147.9 kg)  07/18/15 300 lb (136.1 kg)  03/13/15 299 lb (135.6 kg)    Other studies Reviewed: Lipids from [facility]: LDL 95, HDL 62, TG 65  TTE: 02/04/2015 - Left ventricle: The cavity size was normal. Wall thickness was   increased in a pattern of moderate LVH. Systolic function was   normal. The estimated ejection fraction was in the range of 55%   to 65%. Wall motion was normal; there were no regional wall   motion abnormalities. There is diastolic dysfunction with   indeterminate LV filling pressure. - Mitral valve: Mildly thickened leaflets . There was trivial   regurgitation. - Left atrium: The atrium was normal in size. - Right atrium: The atrium was mildly dilated.  Impressions:  - LVEF 60-65%, moderate LVH, diastolic dysfunction, normal wall   motion, trivial MR, mild RAE    ASSESSMENT AND  PLAN:  1.  Hypertension - controlled on current regimen, Echocardiogram in 2016 showed normal LVEF with moderate concentric LVH. Her EKG today shows no LVH. Blood pressure is well controlled.  2. Hyperlipidemia - LDL 95, goal < 70, on atorvastatin 10 mg po daily, since then she is significantly changed her diet and exercise, this is being followed by her primary care physician and has been at goal.   3. Obesity - she is motivated to loose weight, encouraged that she is doing great job.  4. Preventive cardiology - advised about signs of possible ischemia. Currently asymptomatic.  5. LE edema - PRN lasix 40 mg po daily, not needed lately.  Follow up in 1 year.  Signed, Ena Dawley, MD  04/16/2016 12:15 PM    [address] [facility] [address], [place], [place]  [id number] Phone: [phone]; Fax: [phone]

## 2016-04-19 ENCOUNTER — Ambulatory Visit: Payer: 59 | Admitting: Cardiology

## 2016-07-20 ENCOUNTER — Other Ambulatory Visit: Payer: Self-pay | Admitting: Family Medicine

## 2016-07-20 DIAGNOSIS — Z1231 Encounter for screening mammogram for malignant neoplasm of breast: Secondary | ICD-10-CM

## 2016-08-09 ENCOUNTER — Ambulatory Visit: Payer: Commercial Managed Care - PPO

## 2016-08-13 ENCOUNTER — Other Ambulatory Visit: Payer: Self-pay | Admitting: Family Medicine

## 2016-08-13 DIAGNOSIS — N644 Mastodynia: Secondary | ICD-10-CM

## 2016-08-18 ENCOUNTER — Ambulatory Visit
Admission: RE | Admit: 2016-08-18 | Discharge: 2016-08-18 | Disposition: A | Discharge status: Home / Self Care | Payer: Commercial Managed Care - PPO | Source: Ambulatory Visit | Attending: Family Medicine | Admitting: Family Medicine

## 2016-08-18 DIAGNOSIS — N644 Mastodynia: Secondary | ICD-10-CM

## 2017-03-23 ENCOUNTER — Ambulatory Visit: Payer: Commercial Managed Care - PPO | Admitting: Skilled Nursing Facility1

## 2017-08-04 ENCOUNTER — Other Ambulatory Visit: Payer: Self-pay | Admitting: Family Medicine

## 2017-08-04 DIAGNOSIS — Z1231 Encounter for screening mammogram for malignant neoplasm of breast: Secondary | ICD-10-CM

## 2017-08-16 ENCOUNTER — Encounter: Payer: Self-pay | Admitting: Physician Assistant

## 2017-08-31 DIAGNOSIS — I10 Essential (primary) hypertension: Secondary | ICD-10-CM | POA: Insufficient documentation

## 2017-08-31 DIAGNOSIS — Z0000 Encounter for general adult medical examination without abnormal findings: Secondary | ICD-10-CM | POA: Insufficient documentation

## 2017-08-31 DIAGNOSIS — E785 Hyperlipidemia, unspecified: Secondary | ICD-10-CM | POA: Insufficient documentation

## 2017-08-31 NOTE — Progress Notes (Deleted)
Cardiology Office Note    Date:  08/31/2017   ID:  Hood, Melanie 1955/09/11, MRN [id number]  PCP:  Cari Caraway, MD  Cardiologist: Ena Dawley, MD  No chief complaint on file.   History of Present Illness:  Melanie Hood is a 62 y.o. female with history of hypertension, HLD, obesity, DM.  Last saw Dr. Meda Coffee 04/16/2016 at which time she was feeling much better seeing a nutritionist and had lost 20 pounds.  2D echo in 2016 normal LVEF with moderate concentric LVH.    Past Medical History:  Diagnosis Date  . Allergic rhinitis   . Arthritis   . Chronic back pain   . Diabetes mellitus    type 11   . Diverticular disease of colon   . Exercise-induced asthma   . GERD (gastroesophageal reflux disease)   . Graves disease    status post radioactive iodine treatment  . Heart disease   . Hypertension    mild left vent dysfunction   . Hypothyroidism   . Obesity   . Sleep apnea    cpap-automatic settings  . Vein, varicose     Past Surgical History:  Procedure Laterality Date  . ABDOMINAL HYSTERECTOMY    . APPENDECTOMY    . BREAST BIOPSY Left 1994  . CHOLECYSTECTOMY    . COLONOSCOPY N/A 07/18/2015   Procedure: COLONOSCOPY;  Surgeon: Teena Irani, MD;  Location: [facility];  Service: Endoscopy;  Laterality: N/A;  . ESOPHAGOGASTRODUODENOSCOPY N/A 07/18/2015   Procedure: ESOPHAGOGASTRODUODENOSCOPY (EGD);  Surgeon: Teena Irani, MD;  Location: [facility];  Service: Endoscopy;  Laterality: N/A;  . EYE SURGERY    . FOOT ARTHROPLASTY     rt foot as child  . KNEE ARTHROSCOPY  04/20/2011   Procedure: ARTHROSCOPY KNEE;  Surgeon: Hessie Dibble, MD;  Location: [place];  Service: Orthopedics;  Laterality: Right;  right knee arthroscopy partial medial menisectomy and chondroplasty.  Marland Kitchen TOTAL KNEE ARTHROPLASTY Right 12/11/2013   Procedure: TOTAL KNEE ARTHROPLASTY;  Surgeon: Hessie Dibble, MD;  Location: [place];  Service: Orthopedics;  Laterality: Right;  . TOTAL KNEE  ARTHROPLASTY Left 02/05/2014   Procedure: LEFT TOTAL KNEE ARTHROPLASTY;  Surgeon: Hessie Dibble, MD;  Location: [place];  Service: Orthopedics;  Laterality: Left;  . UPPER GASTROINTESTINAL ENDOSCOPY    . VARICOSE VEIN SURGERY      Current Medications: No outpatient medications have been marked as taking for the 09/06/17 encounter (Appointment) with Melanie Burn, PA-C.     Allergies:   Septra [sulfamethoxazole w/trimethoprim (co-trimoxazole)]; Metoprolol tartrate; Minocycline; Codeine; Erythromycin; Nsaids; Septra [sulfamethoxazole-trimethoprim]; and Tetracyclines & related   Social History   Socioeconomic History  . Marital status: Single    Spouse name: Not on file  . Number of children: Not on file  . Years of education: Not on file  . Highest education level: Not on file  Occupational History  . Not on file  Social Needs  . Financial resource strain: Not on file  . Food insecurity:    Worry: Not on file    Inability: Not on file  . Transportation needs:    Medical: Not on file    Non-medical: Not on file  Tobacco Use  . Smoking status: Former Smoker    Last attempt to quit: 04/12/1986    Years since quitting: 31.4  . Smokeless tobacco: Never Used  Substance and Sexual Activity  . Alcohol use: Yes    Alcohol/week: 0.0 oz  Comment: rarely   . Drug use: No  . Sexual activity: Not on file  Lifestyle  . Physical activity:    Days per week: Not on file    Minutes per session: Not on file  . Stress: Not on file  Relationships  . Social connections:    Talks on phone: Not on file    Gets together: Not on file    Attends religious service: Not on file    Active member of club or organization: Not on file    Attends meetings of clubs or organizations: Not on file    Relationship status: Not on file  Other Topics Concern  . Not on file  Social History Narrative  . Not on file     Family History:  The patient's ***family history includes Colon cancer in her  sister; Diabetes in her father and mother.   ROS:   Please see the history of present illness.    ROS All other systems reviewed and are negative.   PHYSICAL EXAM:   VS:  There were no vitals taken for this visit.  Physical Exam  GEN: Well nourished, well developed, in no acute distress  HEENT: normal  Neck: no JVD, carotid bruits, or masses Cardiac:RRR; no murmurs, rubs, or gallops  Respiratory:  clear to auscultation bilaterally, normal work of breathing GI: soft, nontender, nondistended, + BS Ext: without cyanosis, clubbing, or edema, Good distal pulses bilaterally MS: no deformity or atrophy  Skin: warm and dry, no rash Neuro:  Alert and Oriented x 3, Strength and sensation are intact Psych: euthymic mood, full affect  Wt Readings from Last 3 Encounters:  04/16/16 (!) 326 lb (147.9 kg)  07/18/15 300 lb (136.1 kg)  03/13/15 299 lb (135.6 kg)      Studies/Labs Reviewed:   EKG:  EKG is*** ordered today.  The ekg ordered today demonstrates ***  Recent Labs: No results found for requested labs within last 8760 hours.   Lipid Panel No results found for: CHOL, TRIG, HDL, CHOLHDL, VLDL, LDLCALC, LDLDIRECT  Additional studies/ records that were reviewed today include:      ASSESSMENT:    1. Essential hypertension   2. Mixed hyperlipidemia   3. Preventative health care      PLAN:  In order of problems listed above:  Essential hypertension  Mixed hyperlipidemia  Preventative healthcare    Medication Adjustments/Labs and Tests Ordered: Current medicines are reviewed at length with the patient today.  Concerns regarding medicines are outlined above.  Medication changes, Labs and Tests ordered today are listed in the Patient Instructions below. There are no Patient Instructions on file for this visit.   Sumner Boast, PA-C  08/31/2017 4:09 PM    [facility] [address], [place],   [address] Phone: [phone]; Fax:  [phone]

## 2017-09-01 ENCOUNTER — Ambulatory Visit
Admission: RE | Admit: 2017-09-01 | Discharge: 2017-09-01 | Disposition: A | Discharge status: Home / Self Care | Payer: PRIVATE HEALTH INSURANCE | Source: Ambulatory Visit | Attending: Family Medicine | Admitting: Family Medicine

## 2017-09-01 DIAGNOSIS — Z1231 Encounter for screening mammogram for malignant neoplasm of breast: Secondary | ICD-10-CM

## 2017-09-06 ENCOUNTER — Ambulatory Visit: Payer: PRIVATE HEALTH INSURANCE | Admitting: Physician Assistant

## 2017-10-05 ENCOUNTER — Telehealth: Payer: Self-pay | Admitting: Physician Assistant

## 2017-10-05 NOTE — Telephone Encounter (Signed)
New message:        Pt c/o swelling: STAT is pt has developed SOB within 24 hours  1) How much weight have you gained and in what time span?   2) If swelling, where is the swelling located? legs  3) Are you currently taking a fluid pill? no  4) Are you currently SOB? No  5) Do you have a log of your daily weights (if so, list)? No  6) Have you gained 3 pounds in a day or 5 pounds in a week?   7) Have you traveled recently? No     Pt states she will be at work the rest of the day but we can leave and msg and she will call back tomorrow. Pt states she is off work on Friday if she can be seen then

## 2017-10-05 NOTE — Telephone Encounter (Signed)
Pt is calling to move her 7/9 appt with Ermalinda Barrios PA-C up to a sooner date, for left leg extremity swelling.  Pt states that she only has swelling to her left leg.  Pt reports she has very mild sob on exertion, but NO chest pain.  Pt denies CP, palpitations, sob at rest, or any other cardiac complaints.  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  Pt states she is mainly concerned for she only has swelling to her one extremity, accompanied with mild sob. Pt states she does not have a history of clots or PE's.  Pt states that her left leg has no pain, redness, or streaking to it. Pt reports "I have a negative Homans sign." Pt states she is reporting to work tonight for her shift, and is aware that if symptoms worsen or she is acute, she is to report down to the ER.  Pt states that she is compliant with taking all her cardiac meds.  Pt inquiring about an appt this week in our office.  Offered the pt to see Ellen Henri PA-C for tomorrow 6/27 at 2 pm.  Pt aware to arrive 15 mins prior to this appt.  Informed the pt that I will cancel her 7/9 appt with Estella Husk PA-C.  Informed the pt that I will route this message to Dr Meda Coffee as a general FYI, upon return to the office. Pt verbalized understanding and agrees with this plan.

## 2017-10-06 ENCOUNTER — Ambulatory Visit (HOSPITAL_COMMUNITY)
Admission: RE | Admit: 2017-10-06 | Discharge: 2017-10-06 | Disposition: A | Discharge status: Home / Self Care | Payer: PRIVATE HEALTH INSURANCE | Source: Ambulatory Visit | Attending: Cardiovascular Disease | Admitting: Cardiovascular Disease

## 2017-10-06 ENCOUNTER — Encounter: Payer: Self-pay | Admitting: Cardiology

## 2017-10-06 ENCOUNTER — Other Ambulatory Visit: Payer: Self-pay

## 2017-10-06 ENCOUNTER — Other Ambulatory Visit: Payer: Self-pay | Admitting: Cardiology

## 2017-10-06 ENCOUNTER — Ambulatory Visit: Payer: PRIVATE HEALTH INSURANCE | Admitting: Cardiology

## 2017-10-06 ENCOUNTER — Encounter: Payer: Self-pay | Admitting: *Deleted

## 2017-10-06 VITALS — BP 122/68 | HR 79 | Ht 70.0 in | Wt 336.0 lb

## 2017-10-06 DIAGNOSIS — R600 Localized edema: Secondary | ICD-10-CM | POA: Diagnosis not present

## 2017-10-06 DIAGNOSIS — R0600 Dyspnea, unspecified: Secondary | ICD-10-CM

## 2017-10-06 NOTE — Progress Notes (Signed)
10/06/2017 [name]   September 26, 1955  [id number]  Primary Physician Cari Caraway, MD Primary Cardiologist: Dr. Meda Coffee   Reason for Visit/CC: Edwards and DOE  HPI:  Melanie Hood is a 62 y.o. female who is being seen today for dyspnea and [place]. She is followed by Dr. Meda Coffee. She has a  h/o DM, HTN, HLD and obesity. 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   CC today is C[place] and exertional dyspnea. She has notable Brocker on the L> R. Pt notes recent prolonged travel. A family member recently passed away and she had to make 2 round trips from [place] to [place], in the course of just 6 days. Her left leg feels tight. She notes change in breathing. SOB walking short distances, which is new. No resting dyspnea. No CP. EKG shows NSR 79 bpm currently, but pt notes HR jumps into the 140s on her apple watch with ambulation. She has a family h/o blood clots. Her daughter has had both DVT and PE.     Current Meds  Medication Sig  . acetaminophen (TYLENOL) 500 MG tablet Take 1,000 mg by mouth every 6 (six) hours as needed for moderate pain.  Marland Kitchen albuterol (PROVENTIL HFA;VENTOLIN HFA) 108 (90 BASE) MCG/ACT inhaler Inhale 2 puffs into the lungs every 6 (six) hours as needed for wheezing or shortness of breath.  Marland Kitchen aspirin 81 MG tablet Take 81 mg by mouth every evening.   Marland Kitchen atorvastatin (LIPITOR) 10 MG tablet Take 10 mg by mouth every evening.   . Biotin w/ Vitamins C & E (HAIR/SKIN/NAILS PO) Take by mouth daily.  . cetirizine (ZYRTEC) 10 MG tablet Take 10 mg by mouth every evening.   . Cholecalciferol (VITAMIN D-3) 5000 UNITS TABS Take 5,000-10,000 Units by mouth every evening. Alternates taking 5000 units one day and 10,000 the next.  . Chromium-Cinnamon [phone] MCG-MG CAPS Take 2 capsules by mouth every morning.  . citalopram (CELEXA) 20 MG tablet Take 20 mg by mouth every evening.   Marland Kitchen CRANBERRY PO Take by mouth daily.  Marland Kitchen diltiazem (CARDIZEM LA) 120 MG 24 hr tablet Take 120 mg by  mouth every evening.   . fluticasone (FLONASE) 50 MCG/ACT nasal spray Place 2 sprays into both nostrils every morning.   . Fluticasone-Salmeterol (ADVAIR DISKUS) 250-50 MCG/DOSE AEPB Take 1 puff by mouth as needed for wheezing.  . furosemide (LASIX) 40 MG tablet Take 40 mg by mouth daily.  Marland Kitchen gabapentin (NEURONTIN) 600 MG tablet Take 1 tablet by mouth 4 (four) times daily.  . hydrochlorothiazide (MICROZIDE) 12.5 MG capsule Take 12.5 mg by mouth every evening.   Marland Kitchen ketoconazole (NIZORAL) 2 % shampoo Apply 1 application topically as needed for itching.  . levothyroxine (SYNTHROID) 175 MCG tablet Take 1 tablet by mouth daily.  Marland Kitchen lisinopril (PRINIVIL,ZESTRIL) 10 MG tablet Take 10 mg by mouth daily.  . metFORMIN (GLUMETZA) 500 MG (MOD) 24 hr tablet Take 500 mg by mouth every evening.  . pantoprazole (PROTONIX) 40 MG tablet Take 40 mg by mouth every evening.   . ranitidine (ZANTAC) 150 MG tablet Take 1 tablet by mouth as needed.  . therapeutic multivitamin-minerals (THERAGRAN-M) tablet Take 1 tablet by mouth every morning.   . valACYclovir (VALTREX) 500 MG tablet Take 500 mg by mouth every evening.    Allergies  Allergen Reactions  . Septra [Sulfamethoxazole W/Trimethoprim (Co-Trimoxazole)] Other (See Comments)    Ht rate up  . Metoprolol  Tartrate Other (See Comments)    Memory loss/ panic attacks  . Minocycline Nausea And Vomiting  . Codeine Other (See Comments)    hallucinate  . Erythromycin Other (See Comments)    abd pain  . Nsaids Nausea And Vomiting  . Septra [Sulfamethoxazole-Trimethoprim] Palpitations    Heart rate, breathing went up.   . Tetracyclines & Related Nausea And Vomiting   Past Medical History:  Diagnosis Date  . Allergic rhinitis   . Arthritis   . Chronic back pain   . Diabetes mellitus    type 11   . Diverticular disease of colon   . Exercise-induced asthma   . GERD (gastroesophageal reflux disease)   . Graves disease    status post radioactive iodine treatment    . Heart disease   . Hypertension    mild left vent dysfunction   . Hypothyroidism   . Obesity   . Sleep apnea    cpap-automatic settings  . Vein, varicose    Family History  Problem Relation Age of Onset  . Diabetes Mother   . Diabetes Father   . Colon cancer Sister    Past Surgical History:  Procedure Laterality Date  . ABDOMINAL HYSTERECTOMY    . APPENDECTOMY    . BREAST BIOPSY Left 1994  . CHOLECYSTECTOMY    . COLONOSCOPY N/A 07/18/2015   Procedure: COLONOSCOPY;  Surgeon: Teena Irani, MD;  Location: [facility];  Service: Endoscopy;  Laterality: N/A;  . ESOPHAGOGASTRODUODENOSCOPY N/A 07/18/2015   Procedure: ESOPHAGOGASTRODUODENOSCOPY (EGD);  Surgeon: Teena Irani, MD;  Location: [facility];  Service: Endoscopy;  Laterality: N/A;  . EYE SURGERY    . FOOT ARTHROPLASTY     rt foot as child  . KNEE ARTHROSCOPY  04/20/2011   Procedure: ARTHROSCOPY KNEE;  Surgeon: Hessie Dibble, MD;  Location: [facility];  Service: Orthopedics;  Laterality: Right;  right knee arthroscopy partial medial menisectomy and chondroplasty.  Marland Kitchen TOTAL KNEE ARTHROPLASTY Right 12/11/2013   Procedure: TOTAL KNEE ARTHROPLASTY;  Surgeon: Hessie Dibble, MD;  Location: [facility];  Service: Orthopedics;  Laterality: Right;  . TOTAL KNEE ARTHROPLASTY Left 02/05/2014   Procedure: LEFT TOTAL KNEE ARTHROPLASTY;  Surgeon: Hessie Dibble, MD;  Location: [facility];  Service: Orthopedics;  Laterality: Left;  . UPPER GASTROINTESTINAL ENDOSCOPY    . VARICOSE VEIN SURGERY     Social History   Socioeconomic History  . Marital status: Single    Spouse name: Not on file  . Number of children: Not on file  . Years of education: Not on file  . Highest education level: Not on file  Occupational History  . Not on file  Social Needs  . Financial resource strain: Not on file  . Food insecurity:    Worry: Not on file    Inability: Not on file  . Transportation needs:    Medical: Not on file    Non-medical: Not  on file  Tobacco Use  . Smoking status: Former Smoker    Last attempt to quit: 04/12/1986    Years since quitting: 31.5  . Smokeless tobacco: Never Used  Substance and Sexual Activity  . Alcohol use: Yes    Alcohol/week: 0.0 oz    Comment: rarely   . Drug use: No  . Sexual activity: Not on file  Lifestyle  . Physical activity:    Days per week: Not on file    Minutes per session: Not on file  . Stress: Not on file  Relationships  . Social connections:    Talks on phone: Not on file    Gets together: Not on file    Attends religious service: Not on file    Active member of club or organization: Not on file    Attends meetings of clubs or organizations: Not on file    Relationship status: Not on file  . Intimate partner violence:    Fear of current or ex partner: Not on file    Emotionally abused: Not on file    Physically abused: Not on file    Forced sexual activity: Not on file  Other Topics Concern  . Not on file  Social History Narrative  . Not on file     Review of Systems: General: negative for chills, fever, night sweats or weight changes.  Cardiovascular: negative for chest pain, dyspnea on exertion, edema, orthopnea, palpitations, paroxysmal nocturnal dyspnea or shortness of breath Dermatological: negative for rash Respiratory: negative for cough or wheezing Urologic: negative for hematuria Abdominal: negative for nausea, vomiting, diarrhea, bright red blood per rectum, melena, or hematemesis Neurologic: negative for visual changes, syncope, or dizziness All other systems reviewed and are otherwise negative except as noted above.   Physical Exam:  Blood pressure 122/68, pulse 79, height 5\' 10"  (1.778 m), weight (!) 336 lb (152.4 kg), SpO2 96 %.  General appearance: alert, appears stated age and no distress Neck: no carotid bruit and no JVD Lungs: clear to auscultation bilaterally Heart: regular rate and rhythm, S1, S2 normal, no murmur, click, rub or  gallop Extremities: Bilateral Kaatz, L noticibly worse that the right Pulses: 2+ and symmetric Skin: Skin color, texture, turgor normal. No rashes or lesions Neurologic: Grossly normal  EKG NSR 79 bpm  -- personally reviewed   ASSESSMENT AND PLAN:   1. [place]: visibly noticeable Lepera on the left, in comparison to the contralateral side. Pt notes recent prolonged travel from [place] to [place] x2 round trips 2 weeks ago. She also notes new exertional dyspena. No CP. + FH of blood clots. Daughter with DVT/PE. There is high concern for possible DVT/PE. Will check d-Dimer and STAT bilateral LE venous dopplers. Will also check BNP as well. Further management to be determined after imaging and labs.     Ileta Ofarrell Ladoris Gene, MHS [facility] 10/06/2017 4:11 PM

## 2017-10-06 NOTE — Patient Instructions (Addendum)
Medication Instructions:   Your physician recommends that you continue on your current medications as directed. Please refer to the Current Medication list given to you today.   If you need a refill on your cardiac medications before your next appointment, please call your pharmacy.  Labwork: LABS TODAY D-DIMER BNP AND CBC    Testing/Procedures:  Your physician has requested that you have a lower or upper extremity venous duplex. This test is an ultrasound of the veins in the legs or arms. It looks at venous blood flow that carries blood from the heart to the legs or arms. Allow one hour for a Lower Venous exam. Allow thirty minutes for an Upper Venous exam. There are no restrictions or special instructions.   Follow-Up:  BASED UPON RESULTS    Any Other Special Instructions Will Be Listed Below (If Applicable).

## 2017-10-07 ENCOUNTER — Telehealth: Payer: Self-pay | Admitting: *Deleted

## 2017-10-07 ENCOUNTER — Emergency Department (HOSPITAL_COMMUNITY): Payer: PRIVATE HEALTH INSURANCE

## 2017-10-07 ENCOUNTER — Emergency Department (HOSPITAL_COMMUNITY)
Admission: EM | Admit: 2017-10-07 | Discharge: 2017-10-07 | Disposition: A | Discharge status: Home / Self Care | Payer: PRIVATE HEALTH INSURANCE | Attending: Emergency Medicine | Admitting: Emergency Medicine

## 2017-10-07 ENCOUNTER — Encounter (HOSPITAL_COMMUNITY): Payer: Self-pay | Admitting: Emergency Medicine

## 2017-10-07 DIAGNOSIS — Z79899 Other long term (current) drug therapy: Secondary | ICD-10-CM | POA: Diagnosis not present

## 2017-10-07 DIAGNOSIS — E119 Type 2 diabetes mellitus without complications: Secondary | ICD-10-CM | POA: Insufficient documentation

## 2017-10-07 DIAGNOSIS — Z87891 Personal history of nicotine dependence: Secondary | ICD-10-CM | POA: Diagnosis not present

## 2017-10-07 DIAGNOSIS — E039 Hypothyroidism, unspecified: Secondary | ICD-10-CM | POA: Diagnosis not present

## 2017-10-07 DIAGNOSIS — R2243 Localized swelling, mass and lump, lower limb, bilateral: Secondary | ICD-10-CM | POA: Insufficient documentation

## 2017-10-07 DIAGNOSIS — Z7982 Long term (current) use of aspirin: Secondary | ICD-10-CM | POA: Diagnosis not present

## 2017-10-07 DIAGNOSIS — R0602 Shortness of breath: Secondary | ICD-10-CM | POA: Diagnosis not present

## 2017-10-07 DIAGNOSIS — Z96652 Presence of left artificial knee joint: Secondary | ICD-10-CM | POA: Diagnosis not present

## 2017-10-07 DIAGNOSIS — I10 Essential (primary) hypertension: Secondary | ICD-10-CM | POA: Insufficient documentation

## 2017-10-07 DIAGNOSIS — R600 Localized edema: Secondary | ICD-10-CM

## 2017-10-07 LAB — PRO B NATRIURETIC PEPTIDE: NT-PRO BNP: 77 pg/mL (ref 0–287)

## 2017-10-07 LAB — CBC
HEMATOCRIT: 35.7 % (ref 34.0–46.6)
HEMATOCRIT: 39.3 % (ref 36.0–46.0)
HEMOGLOBIN: 11.9 g/dL (ref 11.1–15.9)
HEMOGLOBIN: 12.3 g/dL (ref 12.0–15.0)
MCH: 29.6 pg (ref 26.6–33.0)
MCH: 28.7 pg (ref 26.0–34.0)
MCHC: 33.3 g/dL (ref 31.5–35.7)
MCHC: 31.3 g/dL (ref 30.0–36.0)
MCV: 89 fL (ref 79–97)
MCV: 91.6 fL (ref 78.0–100.0)
Platelets: 251 10*3/uL (ref 150–450)
Platelets: 244 10*3/uL (ref 150–400)
RBC: 4.02 x10E6/uL (ref 3.77–5.28)
RBC: 4.29 MIL/uL (ref 3.87–5.11)
RDW: 13.5 % (ref 12.3–15.4)
RDW: 14.2 % (ref 11.5–15.5)
WBC: 9.2 10*3/uL (ref 3.4–10.8)
WBC: 8 10*3/uL (ref 4.0–10.5)

## 2017-10-07 LAB — BASIC METABOLIC PANEL
ANION GAP: 8 (ref 5–15)
BUN: 11 mg/dL (ref 8–23)
CHLORIDE: 107 mmol/L (ref 98–111)
CO2: 25 mmol/L (ref 22–32)
Calcium: 9 mg/dL (ref 8.9–10.3)
Creatinine, Ser: 0.94 mg/dL (ref 0.44–1.00)
GFR calc Af Amer: >60 mL/min (ref >60)
GLUCOSE: 139 mg/dL — AB (ref 70–99)
POTASSIUM: 4.1 mmol/L (ref 3.5–5.1)
Sodium: 140 mmol/L (ref 135–145)

## 2017-10-07 LAB — I-STAT TROPONIN, ED
Troponin i, poc: 0 ng/mL (ref 0.00–0.08)
Troponin i, poc: 0 ng/mL (ref 0.00–0.08)

## 2017-10-07 LAB — D-DIMER, QUANTITATIVE: D-DIMER: 1.64 mg/L FEU — ABNORMAL HIGH (ref 0.00–0.49)

## 2017-10-07 LAB — TSH: TSH: 0.753 u[IU]/mL (ref 0.350–4.500)

## 2017-10-07 MED ORDER — FUROSEMIDE 20 MG PO TABS: 20.0000 mg | ORAL_TABLET | Freq: Every day | ORAL | 0 refills | Status: DC

## 2017-10-07 MED ORDER — IOPAMIDOL (ISOVUE-370) INJECTION 76%
INTRAVENOUS | Status: AC
  Administered 2017-10-07: 100 mL
  Filled 2017-10-07: qty 100

## 2017-10-07 MED ORDER — FUROSEMIDE 10 MG/ML IJ SOLN
20.0000 mg | Freq: Once | INTRAMUSCULAR | Status: AC
  Administered 2017-10-07: 20 mg via INTRAVENOUS
  Filled 2017-10-07: qty 2

## 2017-10-07 NOTE — ED Notes (Signed)
Pt ambulatory in hallway with assistive device (cane). Pt SpO2 99% when starting at bedside, fell to 96% while ambulating, was 93% upon returning to bedside, and rose to 99% within roughly 1 minute of returning to bedside.

## 2017-10-07 NOTE — ED Provider Notes (Signed)
Patient placed in Quick Look pathway, seen and evaluated   Chief Complaint: Dyspnea on exertion  HPI:   Pt has been more dyspneic over the past month but particularly with exertion, worse over past couple of days. HR up to 140s on apple watch with exertion yesterday.  No hx DVT/PE. NO hemoptysis, CA tx, recent surgery or hospitalization. No estrogen. Many long car rides in last month. Also had negative doppler of LLE yesterday due to worsening swelling but feels that it may be equivocal.   ROS: Shortness of breath, LLE swelling. No CP.  Physical Exam:   Gen: No distress at rest  Neuro: Awake and Alert  Skin: Warm    Focused Exam: LLE 2+ pitting edema. RLE 1+ pitting edema. Lungs CTA bl. NO wheezes or rales.   Initiation of care has begun. The patient has been counseled on the process, plan, and necessity for staying for the completion/evaluation, and the remainder of the medical screening examination.    Albesa Seen, PA-C 10/07/17 1316    Fredia Sorrow, MD 10/10/17 2213

## 2017-10-07 NOTE — ED Notes (Signed)
Pt discharged from ED; instructions provided and scripts given; Pt encouraged to return to ED if symptoms worsen and to f/u with PCP; Pt verbalized understanding of all instructions 

## 2017-10-07 NOTE — ED Triage Notes (Signed)
Pt to ER for evaluation of shortness of breath worsened over the last week. States lots of recent long travel. Left leg significantly swollen. Reports told she has elevated D Dimer. Pt very dyspneic with exertion. Pt is a/o x4.

## 2017-10-07 NOTE — Telephone Encounter (Signed)
LMOVM  OF NORMAL RESULTS OF ULTRASOUNDS  NO CHANGE AND STILL AWAITING LAB WORK  CONTACT CLINIC BACK [phone]  IF ANY QUESTIONS OR CONCERNS.

## 2017-10-07 NOTE — Telephone Encounter (Signed)
-----   Message from Consuelo Pandy, [place] sent at 10/06/2017  4:15 PM EDT ----- Ultrasound of legs negative for blood clots. Still waiting on lab results.

## 2017-10-07 NOTE — ED Provider Notes (Signed)
[facility] EMERGENCY DEPARTMENT Provider Note   CSN: [id number] Arrival date & time: 10/07/17  1246     History   Chief Complaint Chief Complaint  Patient presents with  . Shortness of Breath    HPI Melanie Hood is a 62 y.o. female with history of diabetes mellitus, GERD, Graves' disease, hypertension with  left ventricular dysfunction, hypothyroidism presents for evaluation of gradual onset, progressively worsening shortness of breath for 1 month.  She notes that this acutely worsened over the past 2 to 3 days.  She notes shortness of breath occurs with any sort of exertion including walking which is abnormal for her.  Denies chest pain.  She notes chronic left lower extremity edema status post left knee replacement but this worsened over the past few days and now she has  right lower extremity edema which is abnormal for her.  She has tried albuterol nebulizers thinking that she may have asthma although she has no diagnosis of asthma.  This was not helpful.  She has also tried compression stockings which she wears chronically but states that her edema has worsened through her stockings.  She notes that her heart rate will jump into the 140s with ambulation and she will feel short of breath and diaphoretic.  She notes that over the past month she has driven to [place] multiple times in making arrangements for her foster sister's sudden passing. No recent travel or surgeries, no hemoptysis, no prior history of DVT or PE, and she does not take any birth control or estrogen replacement therapy. Her cardiologist is Dr. Meda Coffee. She was seen and evaluated by her cardiology PA yesterday who ordered lab work.  proBNP at that time was within normal limits, d-dimer was found to be quite elevated.  DVT study was negative.  She was sent to the ED for rule out of PE.   The history is provided by the patient.    Past Medical History:  Diagnosis Date  . Allergic rhinitis    . Arthritis   . Chronic back pain   . Diabetes mellitus    type 11   . Diverticular disease of colon   . Exercise-induced asthma   . GERD (gastroesophageal reflux disease)   . Graves disease    status post radioactive iodine treatment  . Heart disease   . Hypertension    mild left vent dysfunction   . Hypothyroidism   . Obesity   . Sleep apnea    cpap-automatic settings  . Vein, varicose     Patient Active Problem List   Diagnosis Date Noted  . Hypertension 08/31/2017  . Hyperlipidemia 08/31/2017  . Preventative health care 08/31/2017  . Left knee DJD 02/05/2014  . Right knee DJD 12/11/2013  . Morbid obesity ([place]) 12/11/2013  . Diabetes ([place]) 10/16/2013    Past Surgical History:  Procedure Laterality Date  . ABDOMINAL HYSTERECTOMY    . APPENDECTOMY    . BREAST BIOPSY Left 1994  . CHOLECYSTECTOMY    . COLONOSCOPY N/A 07/18/2015   Procedure: COLONOSCOPY;  Surgeon: Teena Irani, MD;  Location: [facility];  Service: Endoscopy;  Laterality: N/A;  . ESOPHAGOGASTRODUODENOSCOPY N/A 07/18/2015   Procedure: ESOPHAGOGASTRODUODENOSCOPY (EGD);  Surgeon: Teena Irani, MD;  Location: [facility];  Service: Endoscopy;  Laterality: N/A;  . EYE SURGERY    . FOOT ARTHROPLASTY     rt foot as child  . KNEE ARTHROSCOPY  04/20/2011   Procedure: ARTHROSCOPY KNEE;  Surgeon: Monico Blitz  Rhona Raider, MD;  Location: [facility];  Service: Orthopedics;  Laterality: Right;  right knee arthroscopy partial medial menisectomy and chondroplasty.  Marland Kitchen TOTAL KNEE ARTHROPLASTY Right 12/11/2013   Procedure: TOTAL KNEE ARTHROPLASTY;  Surgeon: Hessie Dibble, MD;  Location: [facility];  Service: Orthopedics;  Laterality: Right;  . TOTAL KNEE ARTHROPLASTY Left 02/05/2014   Procedure: LEFT TOTAL KNEE ARTHROPLASTY;  Surgeon: Hessie Dibble, MD;  Location: [facility];  Service: Orthopedics;  Laterality: Left;  . UPPER GASTROINTESTINAL ENDOSCOPY    . VARICOSE VEIN SURGERY       OB History   None      Home  Medications    Prior to Admission medications   Medication Sig Start Date End Date Taking? Authorizing Provider  acetaminophen (TYLENOL) 650 MG CR tablet Take 650 mg by mouth every 12 (twelve) hours.   Yes [provider]  albuterol (PROVENTIL HFA;VENTOLIN HFA) 108 (90 BASE) MCG/ACT inhaler Inhale 2 puffs into the lungs every 6 (six) hours as needed for wheezing or shortness of breath.   Yes [provider]  aspirin 81 MG tablet Take 81 mg by mouth every evening.    Yes [provider]  atorvastatin (LIPITOR) 10 MG tablet Take 10 mg by mouth every evening.    Yes [provider]  Biotin w/ Vitamins C & E (HAIR/SKIN/NAILS PO) Take by mouth daily.   Yes [provider]  cetirizine (ZYRTEC) 10 MG tablet Take 10 mg by mouth every evening.    Yes [provider]  Cholecalciferol (VITAMIN D-3) 5000 UNITS TABS Take 5,000 Units by mouth every evening.    Yes [provider]  Chromium-Cinnamon [phone] MCG-MG CAPS Take 1 capsule by mouth 2 (two) times daily.    Yes [provider]  citalopram (CELEXA) 20 MG tablet Take 20 mg by mouth every evening.    Yes [provider]  CRANBERRY PO Take by mouth daily.   Yes [provider]  Cyanocobalamin (B-12) 1000 MCG SUBL Place 1,000 mcg under the tongue daily.   Yes [provider]  cyclobenzaprine (FLEXERIL) 5 MG tablet Take 5 mg by mouth 3 (three) times daily as needed for muscle spasms.   Yes [provider]  diltiazem (CARDIZEM LA) 120 MG 24 hr tablet Take 120 mg by mouth every evening.    Yes [provider]  fluticasone (FLONASE) 50 MCG/ACT nasal spray Place 2 sprays into both nostrils every morning.    Yes [provider]  Fluticasone-Salmeterol (ADVAIR DISKUS) 250-50 MCG/DOSE AEPB Take 1 puff by mouth as needed for wheezing. 06/21/16  Yes [provider]  gabapentin (NEURONTIN) 600 MG tablet Take 1 tablet by mouth 4 (four)  times daily. 09/29/17  Yes [provider]  hydrochlorothiazide (MICROZIDE) 12.5 MG capsule Take 12.5 mg by mouth every evening.    Yes [provider]  ketoconazole (NIZORAL) 2 % shampoo Apply 1 application topically as needed for itching. 09/29/17  Yes [provider]  levothyroxine (SYNTHROID) 175 MCG tablet Take 1 tablet by mouth daily. 06/21/16  Yes [provider]  lisinopril (PRINIVIL,ZESTRIL) 10 MG tablet Take 10 mg by mouth at bedtime.    Yes [provider]  Magnesium Oxide (MAG-OX PO) Take 500 mg by mouth daily.   Yes [provider]  metFORMIN (GLUMETZA) 500 MG (MOD) 24 hr tablet Take 500 mg by mouth every evening.   Yes [provider]  pantoprazole (PROTONIX) 40 MG tablet Take 40 mg  by mouth daily as needed.    Yes [provider]  ranitidine (ZANTAC) 150 MG tablet Take 1 tablet by mouth as needed. 09/27/17  Yes [provider]  therapeutic multivitamin-minerals (THERAGRAN-M) tablet Take 1 tablet by mouth every morning.    Yes [provider]  Turmeric 500 MG CAPS Take 500 mg by mouth daily.   Yes [provider]  valACYclovir (VALTREX) 500 MG tablet Take 500 mg by mouth every evening.    Yes [provider]  furosemide (LASIX) 20 MG tablet Take 1 tablet (20 mg total) by mouth daily. 10/07/17   Renita Papa, PA-C    Family History Family History  Problem Relation Age of Onset  . Diabetes Mother   . Diabetes Father   . Colon cancer Sister     Social History Social History   Tobacco Use  . Smoking status: Former Smoker    Last attempt to quit: 04/12/1986    Years since quitting: 31.5  . Smokeless tobacco: Never Used  Substance Use Topics  . Alcohol use: Yes    Alcohol/week: 0.0 oz    Comment: rarely   . Drug use: No     Allergies   Septra [sulfamethoxazole w/trimethoprim (co-trimoxazole)]; Metoprolol tartrate; Minocycline; Codeine; Erythromycin; Nsaids; Septra  [sulfamethoxazole-trimethoprim]; and Tetracyclines & related   Review of Systems Review of Systems  Constitutional: Negative for fever.  Respiratory: Positive for shortness of breath. Negative for cough.   Cardiovascular: Positive for leg swelling. Negative for chest pain.  Gastrointestinal: Negative for nausea and vomiting.  All other systems reviewed and are negative.    Physical Exam Updated Vital Signs BP 132/71   Pulse 66   Temp 98.8 F (37.1 C) (Oral)   Resp (!) 24   SpO2 98%   Physical Exam  Constitutional: She appears well-developed and well-nourished. No distress.  Morbidly obese female, resting comfortably in bed  HENT:  Head: Normocephalic and atraumatic.  Eyes: Conjunctivae are normal. Right eye exhibits no discharge. Left eye exhibits no discharge.  Neck: No JVD present. No tracheal deviation present.  Cardiovascular: Normal rate, regular rhythm, normal heart sounds and intact distal pulses.  2+ radial and DP/PT pulses bilaterally, 2+ pitting edema of the left lower extremity, 1+ pitting edema of the right lower extremity.  Left lower extremity appears obviously more swollen as compared to the right.  Homans sign present bilaterally.  Pulmonary/Chest: Effort normal. No accessory muscle usage. She exhibits no mass and no tenderness.  Examination somewhat limited due to body habitus, equal rise and fall of chest, speaking in full sentences without difficulty  Abdominal: Soft. Bowel sounds are normal. She exhibits no distension. There is no tenderness.  Musculoskeletal: Normal range of motion.       Right lower leg: Normal. She exhibits tenderness and edema.       Left lower leg: Normal. She exhibits tenderness and edema.  Neurological: She is alert.  Skin: Skin is warm and dry. No erythema.  Psychiatric: She has a normal mood and affect. Her behavior is normal.  Nursing note and vitals reviewed.    ED Treatments / Results  Labs (all labs ordered are listed,  but only abnormal results are displayed) Labs Reviewed  BASIC METABOLIC PANEL - Abnormal; Notable for the following components:      Result Value   Glucose, Bld 139 (*)    All other components within normal limits  CBC  TSH  I-STAT TROPONIN, ED  I-STAT TROPONIN, ED  EKG None  Radiology Dg Chest 2 View  Result Date: 10/07/2017 CLINICAL DATA:  Worsening shortness of breath with exertion. EXAM: CHEST - 2 VIEW COMPARISON:  Chest x-ray dated June 15, 2016. FINDINGS: The heart remains at the upper limits of normal in size. Normal pulmonary vascularity. Mildly coarsened interstitial markings are similar to prior study. Mild bibasilar atelectasis. No focal consolidation, pleural effusion, or pneumothorax. No acute osseous abnormality. IMPRESSION: Chronic bronchitic changes and mild bibasilar atelectasis. No active cardiopulmonary disease. Electronically Signed   By: Titus Dubin M.D.   On: 10/07/2017 13:33   Ct Angio Chest Pe W/cm &/or Wo Cm  Result Date: 10/07/2017 CLINICAL DATA:  62 year old female with shortness of breath EXAM: CT ANGIOGRAPHY CHEST WITH CONTRAST TECHNIQUE: Multidetector CT imaging of the chest was performed using the standard protocol during bolus administration of intravenous contrast. Multiplanar CT image reconstructions and MIPs were obtained to evaluate the vascular anatomy. CONTRAST:  131mL ISOVUE-370 IOPAMIDOL (ISOVUE-370) INJECTION 76% COMPARISON:  Prior chest CT 01/14/2014 FINDINGS: Cardiovascular: Adequate opacification of the pulmonary arteries to the proximal segmental level. Enlarged main pulmonary artery at 3.4 cm in diameter. No evidence of central filling defect to suggest acute pulmonary embolus. The heart is normal in size. The aorta is normal in size. No evidence of aneurysm. The right brachiocephalic and left common carotid artery share a common origin. The left vertebral artery appears to arise directly from the aorta. No pericardial effusion.  Mediastinum/Nodes: Unremarkable CT appearance of the thyroid gland. No suspicious mediastinal or hilar adenopathy. No soft tissue mediastinal mass. The thoracic esophagus is unremarkable. Lungs/Pleura: Subsegmental atelectasis in the lower lungs. No focal airspace consolidation, pleural effusion or pneumothorax. Upper Abdomen: Visualized upper abdominal organs are unremarkable. Musculoskeletal: No acute fracture or aggressive appearing lytic or blastic osseous lesion. Review of the MIP images confirms the above findings. IMPRESSION: 1. Negative for acute pulmonary embolus, pneumonia or other acute cardiopulmonary process. 2. Mild dependent atelectasis bilaterally. 3. Enlarged main pulmonary artery at 3.4 cm in diameter. Similar findings can be seen in the setting of pulmonary arterial hypertension. Electronically Signed   By: Jacqulynn Cadet M.D.   On: 10/07/2017 16:00    Procedures Procedures (including critical care time)  Medications Ordered in ED Medications  iopamidol (ISOVUE-370) 76 % injection (100 mLs  Contrast Given 10/07/17 1534)  furosemide (LASIX) injection 20 mg (20 mg Intravenous Given 10/07/17 1847)     Initial Impression / Assessment and Plan / ED Course  I have reviewed the triage vital signs and the nursing notes.  Pertinent labs & imaging results that were available during my care of the patient were reviewed by me and considered in my medical decision making (see chart for details).     Patient with progressively worsening dyspnea on exertion and lower extremity edema.  She is afebrile, vital signs are stable.  She is nontoxic in appearance.  She underwent DVT study earlier today which was negative for DVT, sent for rule out of PE due to elevated d-dimer.  BNP was normal at cardiology office yesterday.  Serial troponins are negative, chest x-ray shows chronic bronchitic changes and mild bibasilar atelectasis with no evidence of acute cardiopulmonary abnormalities.  EKG shows  no ischemic changes, no evidence of ACS/MI.  Lab work today unremarkable.  CTA shows no evidence of acute PE, pneumonia, or other acute cardiopulmonary abnormality.  She does have mild dependent atelectasis and enlarged main pulmonary artery at 3.4 cm in diameter which could be seen in the  setting of pulmonary arterial hypertension.  Will obtain a TSH for cardiology to follow-up with.  Patient was ambulated with mild O2 desaturation, overall stable.  5:06 PM Spoke with Dr. Stanford Breed with cardiology who recommends starting the patient on low-dose Lasix daily.  He recommends repeat BMP early next week and close follow-up with cardiology.  States that the evidence of possible pulmonary arterial hypertension could be secondary to hyperventilation from obesity.  On reevaluation, patient is resting comfortably no apparent distress.  She was given 20 mg Lasix IV, will discharge with 20 mg Lasix p.o.  Discussed close follow-up with cardiology.  Discussed strict ED return precautions. Pt verbalized understanding of and agreement with plan and is safe for discharge home at this time.  No complaints prior to discharge.  Final Clinical Impressions(s) / ED Diagnoses   Final diagnoses:  Shortness of breath  Bilateral lower extremity edema    ED Discharge Orders        Ordered    furosemide (LASIX) 20 MG tablet  Daily     10/07/17 1817       Renita Papa, PA-C 10/07/17 1915    Julianne Rice, MD 10/09/17 [id number]

## 2017-10-07 NOTE — Discharge Instructions (Addendum)
Start taking Lasix once daily.  You will need a repeat BMP early next week, Monday or Tuesday.  Follow-up with your cardiologist for reevaluation of your symptoms; call to set up an appointment for sometime next week.  Return to the emergency department immediately for any concerning signs or symptoms develop such as worsening shortness of breath, chest pain, fevers, coughing up blood, or passing out.

## 2017-10-10 ENCOUNTER — Telehealth: Payer: Self-pay | Admitting: Cardiology

## 2017-10-10 DIAGNOSIS — Z5181 Encounter for therapeutic drug level monitoring: Secondary | ICD-10-CM | POA: Insufficient documentation

## 2017-10-10 DIAGNOSIS — Z79899 Other long term (current) drug therapy: Secondary | ICD-10-CM

## 2017-10-10 DIAGNOSIS — I272 Pulmonary hypertension, unspecified: Secondary | ICD-10-CM | POA: Insufficient documentation

## 2017-10-10 DIAGNOSIS — I10 Essential (primary) hypertension: Secondary | ICD-10-CM

## 2017-10-10 DIAGNOSIS — R0609 Other forms of dyspnea: Secondary | ICD-10-CM

## 2017-10-10 NOTE — Telephone Encounter (Signed)
New Message:      Pt is calling and states that she had some labs done on 10/06/17. Pt also states that she received her lab results and pt states she is having some of the same symptoms and she needs to know should she be going to work. She saw Rosita Fire on 10/06/17

## 2017-10-10 NOTE — Telephone Encounter (Signed)
Pts post-hospital appt is scheduled for tomorrow 7/2 at 2 pm with Jory Sims DNP, at our [facility] office.  Pt will have a BMET done same day as appt tomorrow. Pt made aware of appt date, time, and location by Scheduler Ava.  Pt aware to arrive 15 mins prior to that appt.

## 2017-10-10 NOTE — Telephone Encounter (Signed)
  Initial Impression / Assessment and Plan / ED Course  I have reviewed the triage vital signs and the nursing notes.  Pertinent labs & imaging results that were available during my care of the patient were reviewed by me and considered in my medical decision making (see chart for details).   Patient with progressively worsening dyspnea on exertion and lower extremity edema.  She is afebrile, vital signs are stable.  She is nontoxic in appearance.  She underwent DVT study earlier today which was negative for DVT, sent for rule out of PE due to elevated d-dimer.  BNP was normal at cardiology office yesterday.  Serial troponins are negative, chest x-ray shows chronic bronchitic changes and mild bibasilar atelectasis with no evidence of acute cardiopulmonary abnormalities.  EKG shows no ischemic changes, no evidence of ACS/MI.  Lab work today unremarkable.  CTA shows no evidence of acute PE, pneumonia, or other acute cardiopulmonary abnormality.  She does have mild dependent atelectasis and enlarged main pulmonary artery at 3.4 cm in diameter which could be seen in the setting of pulmonary arterial hypertension.  Will obtain a TSH for cardiology to follow-up with.  Patient was ambulated with mild O2 desaturation, overall stable.  5:06 PM Spoke with Dr. Stanford Breed with cardiology who recommends starting the patient on low-dose Lasix daily.  He recommends repeat BMP early next week and close follow-up with cardiology.  States that the evidence of possible pulmonary arterial hypertension could be secondary to hyperventilation from obesity.  On reevaluation, patient is resting comfortably no apparent distress.  She was given 20 mg Lasix IV, will discharge with 20 mg Lasix p.o.  Discussed close follow-up with cardiology.  Discussed strict ED return precautions. Pt verbalized understanding of and agreement with plan and is safe for discharge home at this time.  No complaints prior to discharge.    Pt is  calling in today to schedule her post-hospital follow-up appt for today or tomorrow.  Pt was discharged from the ER on 6/27, and as mentioned above, was instructed to obtain a follow-up appt with our office for early this week, with a BMET same day.  Informed the pt that our Scheduler Jari Sportsman is aware of needed appt, and will be calling her today to arrange for her to come in tomorrow, with lab to be done same day.  Will place the BMET order in the system.  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  Advised the pt to be on the listen out for our scheduler calling her today.  Advised the pt to continue taking her current regimen, as instructed on discharge from the ER.  Pt verbalized understanding and agrees with this plan.  Will send this message to Dr Meda Coffee as an Juluis Rainier.

## 2017-10-11 ENCOUNTER — Ambulatory Visit: Payer: PRIVATE HEALTH INSURANCE | Admitting: Adult Health

## 2017-10-11 ENCOUNTER — Encounter: Payer: Self-pay | Admitting: Adult Health

## 2017-10-11 VITALS — BP 140/82 | HR 70 | Ht 70.0 in | Wt 336.0 lb

## 2017-10-11 DIAGNOSIS — I10 Essential (primary) hypertension: Secondary | ICD-10-CM

## 2017-10-11 DIAGNOSIS — R079 Chest pain, unspecified: Secondary | ICD-10-CM

## 2017-10-11 DIAGNOSIS — R600 Localized edema: Secondary | ICD-10-CM | POA: Diagnosis not present

## 2017-10-11 DIAGNOSIS — R002 Palpitations: Secondary | ICD-10-CM | POA: Diagnosis not present

## 2017-10-11 DIAGNOSIS — R000 Tachycardia, unspecified: Secondary | ICD-10-CM | POA: Diagnosis not present

## 2017-10-11 DIAGNOSIS — R0609 Other forms of dyspnea: Secondary | ICD-10-CM | POA: Diagnosis not present

## 2017-10-11 MED ORDER — METOPROLOL TARTRATE 50 MG PO TABS: 50.0000 mg | ORAL_TABLET | Freq: Once | ORAL | 0 refills | Status: DC

## 2017-10-11 NOTE — Progress Notes (Signed)
Cardiology Office Note   Date:  10/11/2017   ID:  Melanie Hood, Melanie Hood Dec 02, 1955, MRN [id number]  PCP:  Cari Caraway, MD  Cardiologist:   Dr. Meda Coffee Chief Complaint  Patient presents with  . Hospitalization Follow-up  . Dizziness  . Shortness of Breath     History of Present Illness: Melanie Hood is a 62 y.o. female who presents for ongoing assessment and management of hypertension, chronic dyspnea, OSA on CPAP,  with other history of hyperlipidemia, type 2 diabetes, Graves' disease, and obesity.  The patient is a Equities trader.  She was last seen by Dr. Meda Coffee on 10/06/2017 with lower extremity edema left greater than right, she had recently had to airplane trips.  Both of her legs felt tight left worse than right.  It was noted that her family members have had DVTs with her daughter with both DVT and PE.  When seen by Dr. Meda Coffee, a d-dimer was ordered and stat bilateral lower extremity venous Dopplers were to be completed.  A BNP was also ordered.  Vascular ultrasound on 10/06/2017 was negative for bilateral DVT.  D-dimer was elevated at 1.64.  BNP 77.  Unfortunately, the patient presented to the emergency room on 10/07/2017 with worsening shortness of breath and lower extremity edema.  Work-up in the ER was negative for PE, EKG was negative for ischemic changes, lab work was unremarkable, chest x-ray and chest CT was negative for acute PE pneumonia or other cardiopulmonary abnormalities.  A TSH was ordered, she was given 1 dose of IV Lasix 20 mg she was started on low-dose Lasix 20 mg daily, and was concerns for hypoventilation in the setting of obesity.  She comes today with worsening symptoms of dyspnea, chest pressure and tachycardia. She is concerned about increasing symptoms. She has not been able to work as a result.   Past Medical History:  Diagnosis Date  . Allergic rhinitis   . Arthritis   . Chronic back pain   . Diabetes mellitus    type 11   . Diverticular disease of colon     . Exercise-induced asthma   . GERD (gastroesophageal reflux disease)   . Graves disease    status post radioactive iodine treatment  . Heart disease   . Hypertension    mild left vent dysfunction   . Hypothyroidism   . Obesity   . Sleep apnea    cpap-automatic settings  . Vein, varicose     Past Surgical History:  Procedure Laterality Date  . ABDOMINAL HYSTERECTOMY    . APPENDECTOMY    . BREAST BIOPSY Left 1994  . CHOLECYSTECTOMY    . COLONOSCOPY N/A 07/18/2015   Procedure: COLONOSCOPY;  Surgeon: Teena Irani, MD;  Location: [facility];  Service: Endoscopy;  Laterality: N/A;  . ESOPHAGOGASTRODUODENOSCOPY N/A 07/18/2015   Procedure: ESOPHAGOGASTRODUODENOSCOPY (EGD);  Surgeon: Teena Irani, MD;  Location: [facility];  Service: Endoscopy;  Laterality: N/A;  . EYE SURGERY    . FOOT ARTHROPLASTY     rt foot as child  . KNEE ARTHROSCOPY  04/20/2011   Procedure: ARTHROSCOPY KNEE;  Surgeon: Hessie Dibble, MD;  Location: [facility];  Service: Orthopedics;  Laterality: Right;  right knee arthroscopy partial medial menisectomy and chondroplasty.  Marland Kitchen TOTAL KNEE ARTHROPLASTY Right 12/11/2013   Procedure: TOTAL KNEE ARTHROPLASTY;  Surgeon: Hessie Dibble, MD;  Location: [facility];  Service: Orthopedics;  Laterality: Right;  . TOTAL KNEE ARTHROPLASTY Left 02/05/2014   Procedure: LEFT TOTAL KNEE  ARTHROPLASTY;  Surgeon: Hessie Dibble, MD;  Location: [facility];  Service: Orthopedics;  Laterality: Left;  . UPPER GASTROINTESTINAL ENDOSCOPY    . VARICOSE VEIN SURGERY       Current Outpatient Medications  Medication Sig Dispense Refill  . acetaminophen (TYLENOL) 650 MG CR tablet Take 650 mg by mouth every 12 (twelve) hours.    Marland Kitchen albuterol (PROVENTIL HFA;VENTOLIN HFA) 108 (90 BASE) MCG/ACT inhaler Inhale 2 puffs into the lungs every 6 (six) hours as needed for wheezing or shortness of breath.    Marland Kitchen aspirin 81 MG tablet Take 81 mg by mouth every evening.     Marland Kitchen atorvastatin (LIPITOR) 10 MG  tablet Take 10 mg by mouth every evening.     . Biotin w/ Vitamins C & E (HAIR/SKIN/NAILS PO) Take by mouth daily.    . cetirizine (ZYRTEC) 10 MG tablet Take 10 mg by mouth every evening.     . Cholecalciferol (VITAMIN D-3) 5000 UNITS TABS Take 5,000 Units by mouth every evening.     . Chromium-Cinnamon [phone] MCG-MG CAPS Take 1 capsule by mouth 2 (two) times daily.     . citalopram (CELEXA) 20 MG tablet Take 20 mg by mouth every evening.     Marland Kitchen CRANBERRY PO Take by mouth daily.    . Cyanocobalamin (B-12) 1000 MCG SUBL Place 1,000 mcg under the tongue daily.    . cyclobenzaprine (FLEXERIL) 5 MG tablet Take 5 mg by mouth 3 (three) times daily as needed for muscle spasms.    Marland Kitchen diltiazem (CARDIZEM LA) 120 MG 24 hr tablet Take 120 mg by mouth every evening.     . fluticasone (FLONASE) 50 MCG/ACT nasal spray Place 2 sprays into both nostrils every morning.     . Fluticasone-Salmeterol (ADVAIR DISKUS) 250-50 MCG/DOSE AEPB Take 1 puff by mouth as needed for wheezing.    . furosemide (LASIX) 20 MG tablet Take 1 tablet (20 mg total) by mouth daily. 30 tablet 0  . gabapentin (NEURONTIN) 600 MG tablet Take 1 tablet by mouth 4 (four) times daily.  1  . hydrochlorothiazide (MICROZIDE) 12.5 MG capsule Take 12.5 mg by mouth every evening.     Marland Kitchen ketoconazole (NIZORAL) 2 % shampoo Apply 1 application topically as needed for itching.  1  . levothyroxine (SYNTHROID) 175 MCG tablet Take 1 tablet by mouth daily.    Marland Kitchen lisinopril (PRINIVIL,ZESTRIL) 10 MG tablet Take 10 mg by mouth at bedtime.     . Magnesium Oxide (MAG-OX PO) Take 500 mg by mouth daily.    . metFORMIN (GLUMETZA) 500 MG (MOD) 24 hr tablet Take 500 mg by mouth every evening.    . pantoprazole (PROTONIX) 40 MG tablet Take 40 mg by mouth daily as needed.     . ranitidine (ZANTAC) 150 MG tablet Take 1 tablet by mouth as needed.  0  . therapeutic multivitamin-minerals (THERAGRAN-M) tablet Take 1 tablet by mouth every morning.     . Turmeric 500 MG CAPS  Take 500 mg by mouth daily.    . valACYclovir (VALTREX) 500 MG tablet Take 500 mg by mouth every evening.     . metoprolol tartrate (LOPRESSOR) 50 MG tablet Take 1 tablet (50 mg total) by mouth once for 1 dose. ONE HOUR BEFORE TESTING 1 tablet 0   No current facility-administered medications for this visit.     Allergies:   Septra [sulfamethoxazole w/trimethoprim (co-trimoxazole)]; Metoprolol tartrate; Minocycline; Codeine; Erythromycin; Nsaids; Septra [sulfamethoxazole-trimethoprim]; and Tetracyclines & related  Social History:  The patient  reports that she quit smoking about 31 years ago. She has never used smokeless tobacco. She reports that she drinks alcohol. She reports that she does not use drugs.   Family History:  The patient's family history includes Colon cancer in her sister; Diabetes in her father and mother.    ROS: All other systems are reviewed and negative. Unless otherwise mentioned in H&P    PHYSICAL EXAM: VS:  BP 140/82   Pulse 70   Ht 5\' 10"  (1.778 m)   Wt (!) 336 lb (152.4 kg)   BMI 48.21 kg/m  , BMI Body mass index is 48.21 kg/m. GEN: Well nourished, well developed, in no acute distress Morbidly obese  HEENT: normal  Neck: no JVD, carotid bruits, or masses Cardiac: RRR; no murmurs, rubs, or gallops, 2+ dependent edema  Respiratory:  Clear to auscultation bilaterally, normal work of breathing GI: soft, nontender, nondistended, + BS MS: no deformity or atrophy  Skin: warm and dry, no rash Neuro:  Strength and sensation are intact Psych: euthymic mood, full affect   EKG: Not competed during this office visit.   Recent Labs: 10/06/2017: NT-Pro BNP 77 10/07/2017: BUN 11; Creatinine, Ser 0.94; Hemoglobin 12.3; Platelets 244; Potassium 4.1; Sodium 140; TSH 0.753    Lipid Panel No results found for: CHOL, TRIG, HDL, CHOLHDL, VLDL, LDLCALC, LDLDIRECT    Wt Readings from Last 3 Encounters:  10/11/17 (!) 336 lb (152.4 kg)  10/06/17 (!) 336 lb (152.4 kg)   04/16/16 (!) 326 lb (147.9 kg)      Other studies Reviewed: Echocardiogram February 19, 2015 Left ventricle: The cavity size was normal. Wall thickness was   increased in a pattern of moderate LVH. Systolic function was   normal. The estimated ejection fraction was in the range of 55%   to 65%. Wall motion was normal; there were no regional wall   motion abnormalities. There is diastolic dysfunction with   indeterminate LV filling pressure. - Mitral valve: Mildly thickened leaflets . There was trivial   regurgitation. - Left atrium: The atrium was normal in size. - Right atrium: The atrium was mildly dilated.  Impressions:  - LVEF 60-65%, moderate LVH, diastolic dysfunction, normal wall   motion, trivial MR, mild RAE  ASSESSMENT AND PLAN:  1. Chest pressure: Multifactorial in the setting of DOE, pulmonary hypertension, and tachycardia with exertion. She is unable to exercise as a result of symptoms. I have discussed this with Dr. Gwenlyn Found, DOD in the office today. He recommends cardiac CT and echo, instead of RHC, to avoid invasive procedure. IF abnormal then can proceed with cardiac cath. This has been explained to the patient and she is in agreement with this plan. She will follow up with Dr. Meda Coffee to discuss results.   2. Chronic Scheu: She is on lasix 20 mg daily, but I have advised her to take lasix 40 mg today, to assist with her edema.  She is to adhere to low sodium diet.     3. OSA: She is compliant with CPAP.   4. Morbid Obesity: Concerns for hypoventilation syndrome is the setting of obesity and deconditioning. Will evaluate cardiac status prior to recommendations to proceed with exercise program. Weight loss with calorie reduction is recommended.   Current medicines are reviewed at length with the patient today.    Labs/ tests ordered today include: Cardiac CT and Echocardiogram   Phill Myron. West Pugh, ANP, AACC   10/11/2017 3:14 PM  [facility] [address], [place], [place] [address] Phone: [phone]; Fax: [phone]

## 2017-10-11 NOTE — Patient Instructions (Signed)
Testing/Procedures: Echocardiogram - Your physician has requested that you have an echocardiogram. Echocardiography is a painless test that uses sound waves to create images of your heart. It provides your doctor with information about the size and shape of your heart and how well your heart's chambers and valves are working. This procedure takes approximately one hour. There are no restrictions for this procedure. This will be performed at our [facility] location - [address], [address].  Follow-Up: Your physician wants you to follow-up in: AFTER TESTING WITH DR Meda Coffee.   Please arrive at the [facility] main entrance of [facility] at   AM (30-45 minutes prior to test start time)  [facility] [address] [place], [place] [address] [phone]  Proceed to the [facility] Radiology Department (First Floor).  Please follow these instructions carefully (unless otherwise directed):   On the Night Before the Test: Drink plenty of water. Do not consume any caffeinated/decaffeinated beverages or chocolate 12 hours prior to your test. Do not take any antihistamines 12 hours prior to your test.  On the Day of the Test: Drink plenty of water. Do not drink any water within one hour of the test. Do not eat any food 4 hours prior to the test. You may take your regular medications prior to the test. IF NOT ON A BETA BLOCKER - Take 50 mg of Lopressor (metoprolol) one hour before the test. HOLD Furosemide morning of the test.  After the Test: 1. Drink plenty of water. 2. After receiving IV contrast, you may experience a mild flushed feeling. This is normal. On occasion, you may experience a mild rash up to 24 hours after the test. This is not dangerous. -->>If this occurs, you can take Benadryl 25 mg and increase your fluid intake. If you experience trouble breathing, this can be serious. If it is severe call 911 IMMEDIATELY! If it is mild, please call our  office. If you take any of these medications: Glipizide/Metformin, Avandament, Glucavance, please do not take 48 hours after completing test.  Thank you for choosing [facility] at [facility]!!

## 2017-10-17 ENCOUNTER — Other Ambulatory Visit: Payer: Self-pay

## 2017-10-17 ENCOUNTER — Ambulatory Visit (HOSPITAL_COMMUNITY): Payer: PRIVATE HEALTH INSURANCE | Attending: Cardiology

## 2017-10-17 DIAGNOSIS — R079 Chest pain, unspecified: Secondary | ICD-10-CM

## 2017-10-17 DIAGNOSIS — R002 Palpitations: Secondary | ICD-10-CM | POA: Diagnosis not present

## 2017-10-17 DIAGNOSIS — R000 Tachycardia, unspecified: Secondary | ICD-10-CM

## 2017-10-17 DIAGNOSIS — E669 Obesity, unspecified: Secondary | ICD-10-CM | POA: Diagnosis not present

## 2017-10-17 DIAGNOSIS — E785 Hyperlipidemia, unspecified: Secondary | ICD-10-CM | POA: Insufficient documentation

## 2017-10-17 DIAGNOSIS — Z6841 Body Mass Index (BMI) 40.0 and over, adult: Secondary | ICD-10-CM | POA: Insufficient documentation

## 2017-10-17 DIAGNOSIS — E119 Type 2 diabetes mellitus without complications: Secondary | ICD-10-CM | POA: Insufficient documentation

## 2017-10-17 DIAGNOSIS — J45909 Unspecified asthma, uncomplicated: Secondary | ICD-10-CM | POA: Insufficient documentation

## 2017-10-17 DIAGNOSIS — G4733 Obstructive sleep apnea (adult) (pediatric): Secondary | ICD-10-CM | POA: Diagnosis not present

## 2017-10-17 DIAGNOSIS — E0500 Thyrotoxicosis with diffuse goiter without thyrotoxic crisis or storm: Secondary | ICD-10-CM | POA: Insufficient documentation

## 2017-10-17 DIAGNOSIS — I10 Essential (primary) hypertension: Secondary | ICD-10-CM | POA: Diagnosis not present

## 2017-10-17 DIAGNOSIS — Z87891 Personal history of nicotine dependence: Secondary | ICD-10-CM | POA: Diagnosis not present

## 2017-10-18 ENCOUNTER — Ambulatory Visit: Payer: PRIVATE HEALTH INSURANCE | Admitting: Physician Assistant

## 2017-10-19 ENCOUNTER — Telehealth: Payer: Self-pay | Admitting: Cardiology

## 2017-10-19 NOTE — Telephone Encounter (Signed)
New Message   Pt states she has been waiting to here back from someone regarding her fmla forms and when she can return back to work. Please call

## 2017-10-19 NOTE — Telephone Encounter (Signed)
Pt aware FMLA forms have been sent to [employer] to fill out and to allow 7 -10 days for completion. Also pt calling to inquire about a f/u appt after CTA is done. Per Burgess Estelle ,will call pt today and make appt ./cy

## 2017-10-24 ENCOUNTER — Ambulatory Visit (HOSPITAL_COMMUNITY)
Admission: RE | Admit: 2017-10-24 | Discharge: 2017-10-24 | Disposition: A | Discharge status: Home / Self Care | Payer: PRIVATE HEALTH INSURANCE | Source: Ambulatory Visit | Attending: Adult Health | Admitting: Adult Health

## 2017-10-24 ENCOUNTER — Encounter (HOSPITAL_COMMUNITY): Payer: Self-pay

## 2017-10-24 ENCOUNTER — Ambulatory Visit (HOSPITAL_COMMUNITY): Payer: PRIVATE HEALTH INSURANCE

## 2017-10-24 DIAGNOSIS — R000 Tachycardia, unspecified: Secondary | ICD-10-CM

## 2017-10-24 DIAGNOSIS — R918 Other nonspecific abnormal finding of lung field: Secondary | ICD-10-CM | POA: Insufficient documentation

## 2017-10-24 DIAGNOSIS — R0609 Other forms of dyspnea: Secondary | ICD-10-CM | POA: Diagnosis not present

## 2017-10-24 DIAGNOSIS — R002 Palpitations: Secondary | ICD-10-CM | POA: Diagnosis not present

## 2017-10-24 DIAGNOSIS — I281 Aneurysm of pulmonary artery: Secondary | ICD-10-CM | POA: Insufficient documentation

## 2017-10-24 DIAGNOSIS — R079 Chest pain, unspecified: Secondary | ICD-10-CM

## 2017-10-24 MED ORDER — NITROGLYCERIN 0.4 MG SL SUBL
SUBLINGUAL_TABLET | SUBLINGUAL | Status: AC
  Filled 2017-10-24: qty 2

## 2017-10-24 MED ORDER — NITROGLYCERIN 0.4 MG SL SUBL
0.8000 mg | SUBLINGUAL_TABLET | SUBLINGUAL | Status: DC | PRN
  Administered 2017-10-24: 0.8 mg via SUBLINGUAL

## 2017-10-24 MED ORDER — IOPAMIDOL (ISOVUE-370) INJECTION 76%
80.0000 mL | Freq: Once | INTRAVENOUS | Status: AC | PRN
  Administered 2017-10-24: 100 mL via INTRAVENOUS

## 2017-10-24 MED ORDER — IOPAMIDOL (ISOVUE-370) INJECTION 76%
INTRAVENOUS | Status: AC
  Filled 2017-10-24: qty 100

## 2017-10-25 ENCOUNTER — Other Ambulatory Visit: Payer: Self-pay | Admitting: *Deleted

## 2017-10-25 ENCOUNTER — Encounter: Payer: Self-pay | Admitting: *Deleted

## 2017-10-25 ENCOUNTER — Ambulatory Visit (INDEPENDENT_AMBULATORY_CARE_PROVIDER_SITE_OTHER): Payer: PRIVATE HEALTH INSURANCE

## 2017-10-25 ENCOUNTER — Ambulatory Visit: Payer: PRIVATE HEALTH INSURANCE | Admitting: Cardiology

## 2017-10-25 ENCOUNTER — Encounter: Payer: Self-pay | Admitting: Cardiology

## 2017-10-25 VITALS — BP 110/70 | HR 70 | Ht 70.0 in | Wt 336.0 lb

## 2017-10-25 DIAGNOSIS — R000 Tachycardia, unspecified: Secondary | ICD-10-CM

## 2017-10-25 DIAGNOSIS — R0600 Dyspnea, unspecified: Secondary | ICD-10-CM

## 2017-10-25 DIAGNOSIS — Z01812 Encounter for preprocedural laboratory examination: Secondary | ICD-10-CM | POA: Diagnosis not present

## 2017-10-25 MED ORDER — FUROSEMIDE 40 MG PO TABS: 40.0000 mg | ORAL_TABLET | Freq: Every day | ORAL | 3 refills | Status: DC

## 2017-10-25 NOTE — Patient Instructions (Addendum)
Your physician has recommended you make the following change in your medication:  [place]  Your physician recommends that you return for lab work in: [place]  [place] has recommended that you wear a holter monitor. Holter monitors are medical devices that record the heart's electrical activity. Doctors most often use these monitors to diagnose arrhythmias. Arrhythmias are problems with the speed or rhythm of the heartbeat. The monitor is a small, portable device. You can wear one while you do your normal daily activities. This is usually used to diagnose what is causing palpitations/syncope (passing out).  Your physician has requested that you have a cardiac catheterization. Cardiac catheterization is used to diagnose and/or treat various heart conditions. Doctors may recommend this procedure for a number of different reasons. The most common reason is to evaluate chest pain. Chest pain can be a symptom of coronary artery disease (CAD), and cardiac catheterization can show whether plaque is narrowing or blocking your heart's arteries. This procedure is also used to evaluate the valves, as well as measure the blood flow and oxygen levels in different parts of your heart. For further information please visit [email or web address]. Please follow instruction sheet, as given. RIGHT HEART CATH

## 2017-10-25 NOTE — Progress Notes (Signed)
10/25/2017 Melanie Hood   01/09/1956  [id number]  Primary Physician Cari Caraway, MD Primary Cardiologist: Dr. Meda Coffee   Reason for Visit/CC: f/u for Dyspnea and tachycardia  HPI:  Melanie Hood is a 62 y.o. female who is being seen today for f/u for dyspnea and tachycardia.    She is followed by Dr. Meda Coffee. She has a  h/o DM, HTN, HLD, OSA compliant with CPAP and obesity. She is an Therapist, sports. She use to work at [employer] but now works in [employer].   I evaluated her on 10/06/17, given complaints of [place] and exertional dyspnea. She had notable [place]inz on the L> R. Pt noted recent prolonged travel. A family member recently passed away and she had to make 2 round trips from [place] to [place], in the course of just 6 days. Her left leg felt tight. She noted change in breathing. SOB walking short distances, which is new. No resting dyspnea. No CP. EKG showed NSR 79 bpm, but pt noted HR jumps into the 140s on her apple watch with ambulation. She has a family h/o blood clots. Her daughter has had both DVT and PE.   I ordered a D-dimer, which was elevated at 1.64. Subsequently, she was referred for bilateral LE venous dopplers as well as for chest CT to r/o DVT/PE. Both studies were negative. However chest CT suggested possible pulmonary artery HTN. She was given a dose of IV Lasix in the ED, when she went for her CT scan and she was instructed to f/u in our clinic. She was seen by an APP at [facility] on 10/11/17. PO lasix was increased from 20 mg to 40 mg daily, as pt was still SOB. Case was discussed with Dr. Gwenlyn Found, DOD, who recommended 2D echo and coronary CTA. 2D echo showed normal EF at 60-65%, G1DD, normal wall motion. Unfortunately, pulmonary artery systolic BP could not be accurately estimated. Coronary CTA was normal. No evidence of CAD. Calcium score was 0. Dilated pulmonary artery measuring 34 mm was noted.   Pt notes some improvement with Prak after increasing lasix to 40 mg daily but no improvement in  dyspnea. Still SOB w/ activity. She also continues to have elevated HR w/ ambulation into the 150s. Feels palpitations and SOB. Some occasional chest tightness. BP is stable on higher dose of Lasix. She needs a f/u BMP.   CV Studies  Coronary CTA 10/24/17  IMPRESSION: 1. Coronary calcium score of 0. This was 0 percentile for age and sex matched control.  2. Normal coronary origin with right dominance.  3. No evidence of CAD.  4. Dilated pulmonary artery measuring 34 mm. 2D Echo 10/17/17 Study Conclusions  - Left ventricle: The cavity size was normal. There was mild focal   basal hypertrophy of the septum. Systolic function was normal.   The estimated ejection fraction was in the range of 60% to 65%.   Wall motion was normal; there were no regional wall motion   abnormalities. There was an increased relative contribution of   atrial contraction to ventricular filling. Doppler parameters are   consistent with abnormal left ventricular relaxation (grade 1   diastolic dysfunction). - Mitral valve: Calcified annulus. - Right ventricle: The cavity size was mildly dilated. Wall   thickness was normal. - Pulmonary arteries: Systolic pressure could not be accurately   estimated.  Impressions:  - NL LVF, grade 1 diastolic dysfunction, mild BSH, trivial PR,   mildly dilated RV.  LE Venous Dopplers 09/2017  Final Interpretation: Right: No evidence of deep vein thrombosis in the lower extremity. No indirect evidence of obstruction proximal to the inguinal ligament. No cystic structure found in the popliteal fossa. Left: No evidence of deep vein thrombosis in the lower extremity. No indirect evidence of obstruction proximal to the inguinal ligament. No cystic structure found in the popliteal fossa. Interstitial fluid noted in the left calf.   Current Meds  Medication Sig  . acetaminophen (TYLENOL) 650 MG CR tablet Take 650 mg by mouth every 12 (twelve) hours.  Marland Kitchen albuterol (PROVENTIL  HFA;VENTOLIN HFA) 108 (90 BASE) MCG/ACT inhaler Inhale 2 puffs into the lungs every 6 (six) hours as needed for wheezing or shortness of breath.  Marland Kitchen aspirin 81 MG tablet Take 81 mg by mouth every evening.   Marland Kitchen atorvastatin (LIPITOR) 10 MG tablet Take 10 mg by mouth every evening.   . Biotin w/ Vitamins C & E (HAIR/SKIN/NAILS PO) Take by mouth daily.  . cetirizine (ZYRTEC) 10 MG tablet Take 10 mg by mouth every evening.   . Cholecalciferol (VITAMIN D-3) 5000 UNITS TABS Take 5,000 Units by mouth every evening.   . Chromium-Cinnamon [phone] MCG-MG CAPS Take 1 capsule by mouth 2 (two) times daily.   . citalopram (CELEXA) 20 MG tablet Take 20 mg by mouth every evening.   Marland Kitchen CRANBERRY PO Take by mouth daily.  . Cyanocobalamin (B-12) 1000 MCG SUBL Place 1,000 mcg under the tongue daily.  . cyclobenzaprine (FLEXERIL) 5 MG tablet Take 5 mg by mouth 3 (three) times daily as needed for muscle spasms.  Marland Kitchen diltiazem (CARDIZEM LA) 120 MG 24 hr tablet Take 120 mg by mouth every evening.   . fluticasone (FLONASE) 50 MCG/ACT nasal spray Place 2 sprays into both nostrils every morning.   . Fluticasone-Salmeterol (ADVAIR DISKUS) 250-50 MCG/DOSE AEPB Take 1 puff by mouth as needed for wheezing.  . furosemide (LASIX) 40 MG tablet Take 1 tablet (40 mg total) by mouth daily.  Marland Kitchen gabapentin (NEURONTIN) 600 MG tablet Take 1 tablet by mouth 4 (four) times daily.  . hydrochlorothiazide (MICROZIDE) 12.5 MG capsule Take 12.5 mg by mouth every evening.   Marland Kitchen ketoconazole (NIZORAL) 2 % shampoo Apply 1 application topically as needed for itching.  . levothyroxine (SYNTHROID) 175 MCG tablet Take 1 tablet by mouth daily.  Marland Kitchen lisinopril (PRINIVIL,ZESTRIL) 10 MG tablet Take 10 mg by mouth at bedtime.   . Magnesium Oxide (MAG-OX PO) Take 500 mg by mouth daily.  . metFORMIN (GLUMETZA) 500 MG (MOD) 24 hr tablet Take 500 mg by mouth every evening.  . pantoprazole (PROTONIX) 40 MG tablet Take 40 mg by mouth daily as needed.   . ranitidine  (ZANTAC) 150 MG tablet Take 1 tablet by mouth as needed.  . therapeutic multivitamin-minerals (THERAGRAN-M) tablet Take 1 tablet by mouth every morning.   . Turmeric 500 MG CAPS Take 500 mg by mouth daily.  . valACYclovir (VALTREX) 500 MG tablet Take 500 mg by mouth every evening.   . [DISCONTINUED] furosemide (LASIX) 20 MG tablet Take 1 tablet (20 mg total) by mouth daily.   Allergies  Allergen Reactions  . Septra [Sulfamethoxazole W/Trimethoprim (Co-Trimoxazole)] Other (See Comments)    Ht rate up  . Metoprolol Tartrate Other (See Comments)    Memory loss/ panic attacks  . Minocycline Nausea And Vomiting  . Codeine Other (See Comments)    hallucinate  . Erythromycin Other (See Comments)    abd pain  . Nsaids Nausea And Vomiting  . Septra [  Sulfamethoxazole-Trimethoprim] Palpitations    Heart rate, breathing went up.   . Tetracyclines & Related Nausea And Vomiting   Past Medical History:  Diagnosis Date  . Allergic rhinitis   . Arthritis   . Chronic back pain   . Diabetes mellitus    type 11   . Diverticular disease of colon   . Exercise-induced asthma   . GERD (gastroesophageal reflux disease)   . Graves disease    status post radioactive iodine treatment  . Heart disease   . Hypertension    mild left vent dysfunction   . Hypothyroidism   . Obesity   . Sleep apnea    cpap-automatic settings  . Vein, varicose    Family History  Problem Relation Age of Onset  . Diabetes Mother   . Diabetes Father   . Colon cancer Sister    Past Surgical History:  Procedure Laterality Date  . ABDOMINAL HYSTERECTOMY    . APPENDECTOMY    . BREAST BIOPSY Left 1994  . CHOLECYSTECTOMY    . COLONOSCOPY N/A 07/18/2015   Procedure: COLONOSCOPY;  Surgeon: Teena Irani, MD;  Location: [facility];  Service: Endoscopy;  Laterality: N/A;  . ESOPHAGOGASTRODUODENOSCOPY N/A 07/18/2015   Procedure: ESOPHAGOGASTRODUODENOSCOPY (EGD);  Surgeon: Teena Irani, MD;  Location: [facility];  Service:  Endoscopy;  Laterality: N/A;  . EYE SURGERY    . FOOT ARTHROPLASTY     rt foot as child  . KNEE ARTHROSCOPY  04/20/2011   Procedure: ARTHROSCOPY KNEE;  Surgeon: Hessie Dibble, MD;  Location: [place];  Service: Orthopedics;  Laterality: Right;  right knee arthroscopy partial medial menisectomy and chondroplasty.  Marland Kitchen TOTAL KNEE ARTHROPLASTY Right 12/11/2013   Procedure: TOTAL KNEE ARTHROPLASTY;  Surgeon: Hessie Dibble, MD;  Location: [place];  Service: Orthopedics;  Laterality: Right;  . TOTAL KNEE ARTHROPLASTY Left 02/05/2014   Procedure: LEFT TOTAL KNEE ARTHROPLASTY;  Surgeon: Hessie Dibble, MD;  Location: [place];  Service: Orthopedics;  Laterality: Left;  . UPPER GASTROINTESTINAL ENDOSCOPY    . VARICOSE VEIN SURGERY     Social History   Socioeconomic History  . Marital status: Single    Spouse name: Not on file  . Number of children: Not on file  . Years of education: Not on file  . Highest education level: Not on file  Occupational History  . Not on file  Social Needs  . Financial resource strain: Not on file  . Food insecurity:    Worry: Not on file    Inability: Not on file  . Transportation needs:    Medical: Not on file    Non-medical: Not on file  Tobacco Use  . Smoking status: Former Smoker    Last attempt to quit: 04/12/1986    Years since quitting: 31.5  . Smokeless tobacco: Never Used  Substance and Sexual Activity  . Alcohol use: Yes    Alcohol/week: 0.0 oz    Comment: rarely   . Drug use: No  . Sexual activity: Not on file  Lifestyle  . Physical activity:    Days per week: Not on file    Minutes per session: Not on file  . Stress: Not on file  Relationships  . Social connections:    Talks on phone: Not on file    Gets together: Not on file    Attends religious service: Not on file    Active member of club or organization: Not on file    Attends meetings  of clubs or organizations: Not on file    Relationship status: Not on file  .  Intimate partner violence:    Fear of current or ex partner: Not on file    Emotionally abused: Not on file    Physically abused: Not on file    Forced sexual activity: Not on file  Other Topics Concern  . Not on file  Social History Narrative  . Not on file     Review of Systems: General: negative for chills, fever, night sweats or weight changes.  Cardiovascular: negative for chest pain, dyspnea on exertion, edema, orthopnea, palpitations, paroxysmal nocturnal dyspnea or shortness of breath Dermatological: negative for rash Respiratory: negative for cough or wheezing Urologic: negative for hematuria Abdominal: negative for nausea, vomiting, diarrhea, bright red blood per rectum, melena, or hematemesis Neurologic: negative for visual changes, syncope, or dizziness All other systems reviewed and are otherwise negative except as noted above.   Physical Exam:  Blood pressure 110/70, pulse 70, height 5\' 10"  (1.778 m), weight (!) 336 lb (152.4 kg), SpO2 98 %.  General appearance: alert, cooperative, no distress and morbidly obese Neck: no carotid bruit and no JVD Lungs: clear to auscultation bilaterally Heart: regular rate and rhythm, S1, S2 normal, no murmur, click, rub or gallop Extremities: bilateral [place] Pulses: 2+ and symmetric Skin: Skin color, texture, turgor normal. No rashes or lesions Neurologic: Grossly normal  EKG not performed -- personally reviewed   ASSESSMENT AND PLAN:   1. Dyspnea: chest CT negative for PE. Recent CBC showed normal H/H. Coronary CTA normal w/o CAD and calcium score of 0. 2D Echo showed normal LVEF and G1DD. No significant valvular disease. PA pressure could not be accurately calculated however chest CT did suggest pulmonary HTN. She is noted to have a dilated main pulmonary artery.  She is on diuretic therapy w/ Lasix. We will arrange for a RHC to better assess and will also arrange for a 48 hr holter monitor given increased HRs. Continue strict  compliance w/ CPAP. If monitor shows no worrisome arrhthymias, then I would recommend weight loss/ through increasing physical activity and diet modification. We will check a f/u BMP given recent increase in Lasix dose.   Follow-Up post RHC.   Brittainy Ladoris Gene, [facility] 10/25/2017 12:30 PM

## 2017-10-26 LAB — CBC
Hematocrit: 40.5 % (ref 34.0–46.6)
Hemoglobin: 13.2 g/dL (ref 11.1–15.9)
MCH: 29 pg (ref 26.6–33.0)
MCHC: 32.6 g/dL (ref 31.5–35.7)
MCV: 89 fL (ref 79–97)
PLATELETS: 243 10*3/uL (ref 150–450)
RBC: 4.55 x10E6/uL (ref 3.77–5.28)
RDW: 14.4 % (ref 12.3–15.4)
WBC: 8.6 10*3/uL (ref 3.4–10.8)

## 2017-10-26 LAB — BASIC METABOLIC PANEL
BUN / CREAT RATIO: 13 (ref 12–28)
BUN: 11 mg/dL (ref 8–27)
CHLORIDE: 101 mmol/L (ref 96–106)
CO2: 23 mmol/L (ref 20–29)
Calcium: 9.2 mg/dL (ref 8.7–10.3)
Creatinine, Ser: 0.88 mg/dL (ref 0.57–1.00)
GFR calc Af Amer: 81 mL/min/{1.73_m2} (ref >59)
GFR calc non Af Amer: 71 mL/min/{1.73_m2} (ref >59)
GLUCOSE: 113 mg/dL — AB (ref 65–99)
Potassium: 4.8 mmol/L (ref 3.5–5.2)
SODIUM: 140 mmol/L (ref 134–144)

## 2017-11-04 ENCOUNTER — Telehealth: Payer: Self-pay | Admitting: Cardiology

## 2017-11-04 NOTE — Telephone Encounter (Signed)
Pt called following up on her FMLA paper work.  Pt stated only has 7-14 days to be filled out.  Reminded pt of her CATH and the status of dr Meda Coffee being in the office. Paper work FILLED out after [employer].

## 2017-11-04 NOTE — Telephone Encounter (Signed)
[place], this pt needs a supporting letter for her to continue to stay out of work at [employer], until all testing is done.  You scheduled her for a right heart cath for next Tuesday 7/29 for Dr Tamala Julian to do.  Dr Meda Coffee has her FMLA paperwork in her folder and is waiting on all test/procedures to come back before completing forms all together. Also Dr Meda Coffee is out of the country until 11/14/17, so the FMLA forms will not be able to be completed until then. All testing so far has come back normal. She is requesting an updated letter to provide to her employer until her cath is done and she is safe to return to work thereafter. Could you assist with this letter?

## 2017-11-07 ENCOUNTER — Telehealth: Payer: Self-pay | Admitting: Cardiology

## 2017-11-07 ENCOUNTER — Telehealth: Payer: Self-pay | Admitting: *Deleted

## 2017-11-07 NOTE — Telephone Encounter (Signed)
Pt contacted pre-catheterization scheduled at [facility] for: Tuesday November 08, 2017 7:30 AM Verified arrival time and place: [place] Entrance A at: 5:30 AM  No solid food after midnight prior to cath, clear liquids until 5 AM day of procedure. Verified allergies in Epic  Hold: Furosemide AM of procedure. Metformin AM of procedure.   Except hold medications AM meds can be  taken pre-cath with sip of water including: ASA 81 mg  Confirmed patient has responsible person to drive home post procedure and for 24 hours after you arrive home: yes

## 2017-11-07 NOTE — Telephone Encounter (Signed)
Pt is calling to ask Ellen Henri PA-C if she could write a supporting letter stating that she is to remain out of work until all testing is done, and Dr Meda Coffee can fill out her FMLA paperwork on 11/14/17.  Pt states that she is scheduled for a right heart cath, as ordered by [place], for tomorrow 7/30 for Dr Tamala Julian to do. 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   Pt would like the letter sent to [place]: Leave Dept at [phone].  Pt needs supporting information in the letter to state she is still under testing and her FMLA paperwork cannot be completed until her General Cardiologist returns to work on 8/5.  Informed the pt that [place] is in the office tomorrow, so I will route this request to her for further review on requested letter/documentation, and fax accordingly thereafter. Pt verbalized understanding and agrees with this plan.

## 2017-11-07 NOTE — Telephone Encounter (Signed)
New Message:      Pt is requesting a call back regarding her FMLA Papers. Pt states she can not wait until after her cath to get these papers filled.

## 2017-11-08 ENCOUNTER — Ambulatory Visit (HOSPITAL_COMMUNITY)
Admission: RE | Admit: 2017-11-08 | Discharge: 2017-11-08 | Disposition: A | Discharge status: Home / Self Care | Payer: PRIVATE HEALTH INSURANCE | Source: Ambulatory Visit | Attending: Interventional Cardiology | Admitting: Interventional Cardiology

## 2017-11-08 ENCOUNTER — Encounter (HOSPITAL_COMMUNITY)
Admission: RE | Disposition: A | Discharge status: Home / Self Care | Payer: Self-pay | Source: Ambulatory Visit | Attending: Interventional Cardiology

## 2017-11-08 ENCOUNTER — Encounter: Payer: Self-pay | Admitting: *Deleted

## 2017-11-08 ENCOUNTER — Telehealth: Payer: Self-pay | Admitting: Cardiology

## 2017-11-08 ENCOUNTER — Other Ambulatory Visit: Payer: Self-pay

## 2017-11-08 DIAGNOSIS — E039 Hypothyroidism, unspecified: Secondary | ICD-10-CM | POA: Diagnosis not present

## 2017-11-08 DIAGNOSIS — G8929 Other chronic pain: Secondary | ICD-10-CM | POA: Diagnosis not present

## 2017-11-08 DIAGNOSIS — M1990 Unspecified osteoarthritis, unspecified site: Secondary | ICD-10-CM | POA: Insufficient documentation

## 2017-11-08 DIAGNOSIS — E785 Hyperlipidemia, unspecified: Secondary | ICD-10-CM | POA: Diagnosis not present

## 2017-11-08 DIAGNOSIS — M549 Dorsalgia, unspecified: Secondary | ICD-10-CM | POA: Insufficient documentation

## 2017-11-08 DIAGNOSIS — G4733 Obstructive sleep apnea (adult) (pediatric): Secondary | ICD-10-CM | POA: Diagnosis not present

## 2017-11-08 DIAGNOSIS — Z87891 Personal history of nicotine dependence: Secondary | ICD-10-CM | POA: Insufficient documentation

## 2017-11-08 DIAGNOSIS — J45990 Exercise induced bronchospasm: Secondary | ICD-10-CM | POA: Diagnosis not present

## 2017-11-08 DIAGNOSIS — E119 Type 2 diabetes mellitus without complications: Secondary | ICD-10-CM | POA: Diagnosis not present

## 2017-11-08 DIAGNOSIS — I272 Pulmonary hypertension, unspecified: Secondary | ICD-10-CM | POA: Diagnosis not present

## 2017-11-08 DIAGNOSIS — Z6841 Body Mass Index (BMI) 40.0 and over, adult: Secondary | ICD-10-CM | POA: Diagnosis not present

## 2017-11-08 DIAGNOSIS — E669 Obesity, unspecified: Secondary | ICD-10-CM | POA: Diagnosis not present

## 2017-11-08 DIAGNOSIS — K219 Gastro-esophageal reflux disease without esophagitis: Secondary | ICD-10-CM | POA: Insufficient documentation

## 2017-11-08 DIAGNOSIS — I10 Essential (primary) hypertension: Secondary | ICD-10-CM | POA: Diagnosis not present

## 2017-11-08 HISTORY — PX: RIGHT HEART CATH: CATH118263

## 2017-11-08 LAB — POCT I-STAT 3, VENOUS BLOOD GAS (G3P V)
Acid-base deficit: 2 mmol/L (ref 0.0–2.0)
BICARBONATE: 24.4 mmol/L (ref 20.0–28.0)
BICARBONATE: 26 mmol/L (ref 20.0–28.0)
O2 SAT: 70 %
O2 Saturation: 70 %
PCO2 VEN: 44.9 mmHg (ref 44.0–60.0)
PCO2 VEN: 48.7 mmHg (ref 44.0–60.0)
PO2 VEN: 39 mmHg (ref 32.0–45.0)
PO2 VEN: 40 mmHg (ref 32.0–45.0)
TCO2: 27 mmol/L (ref 22–32)
TCO2: 26 mmol/L (ref 22–32)
pH, Ven: 7.335 (ref 7.250–7.430)
pH, Ven: 7.343 (ref 7.250–7.430)

## 2017-11-08 LAB — GLUCOSE, CAPILLARY
GLUCOSE-CAPILLARY: 126 mg/dL — AB (ref 70–99)
GLUCOSE-CAPILLARY: 115 mg/dL — AB (ref 70–99)

## 2017-11-08 SURGERY — RIGHT HEART CATH

## 2017-11-08 MED ORDER — ASPIRIN 81 MG PO CHEW: 81.0000 mg | CHEWABLE_TABLET | ORAL | Status: DC

## 2017-11-08 MED ORDER — SODIUM CHLORIDE 0.9% FLUSH: 3.0000 mL | Freq: Two times a day (BID) | INTRAVENOUS | Status: DC

## 2017-11-08 MED ORDER — FENTANYL CITRATE (PF) 100 MCG/2ML IJ SOLN
INTRAMUSCULAR | Status: AC
  Filled 2017-11-08: qty 2

## 2017-11-08 MED ORDER — SODIUM CHLORIDE 0.9 % IV SOLN: INTRAVENOUS | Status: DC

## 2017-11-08 MED ORDER — SODIUM CHLORIDE 0.9 % IV SOLN: 250.0000 mL | INTRAVENOUS | Status: DC | PRN

## 2017-11-08 MED ORDER — HEPARIN (PORCINE) IN NACL 1000-0.9 UT/500ML-% IV SOLN
INTRAVENOUS | Status: AC
  Filled 2017-11-08: qty 500

## 2017-11-08 MED ORDER — SODIUM CHLORIDE 0.9% FLUSH: 3.0000 mL | INTRAVENOUS | Status: DC | PRN

## 2017-11-08 MED ORDER — LIDOCAINE HCL (PF) 1 % IJ SOLN
INTRAMUSCULAR | Status: DC | PRN
  Administered 2017-11-08: 2 mL

## 2017-11-08 MED ORDER — HEPARIN (PORCINE) IN NACL 1000-0.9 UT/500ML-% IV SOLN
INTRAVENOUS | Status: DC | PRN
  Administered 2017-11-08: 500 mL

## 2017-11-08 MED ORDER — SODIUM CHLORIDE 0.9 % WEIGHT BASED INFUSION
3.0000 mL/kg/h | INTRAVENOUS | Status: AC
  Administered 2017-11-08: 3 mL/kg/h via INTRAVENOUS

## 2017-11-08 MED ORDER — FENTANYL CITRATE (PF) 100 MCG/2ML IJ SOLN
INTRAMUSCULAR | Status: DC | PRN
  Administered 2017-11-08: 50 ug via INTRAVENOUS

## 2017-11-08 MED ORDER — SODIUM CHLORIDE 0.9 % WEIGHT BASED INFUSION: 1.0000 mL/kg/h | INTRAVENOUS | Status: DC

## 2017-11-08 MED ORDER — ACETAMINOPHEN 325 MG PO TABS: 650.0000 mg | ORAL_TABLET | ORAL | Status: DC | PRN

## 2017-11-08 MED ORDER — MIDAZOLAM HCL 2 MG/2ML IJ SOLN
INTRAMUSCULAR | Status: AC
  Filled 2017-11-08: qty 2

## 2017-11-08 MED ORDER — MIDAZOLAM HCL 2 MG/2ML IJ SOLN
INTRAMUSCULAR | Status: DC | PRN
  Administered 2017-11-08 (×2): 1 mg via INTRAVENOUS

## 2017-11-08 MED ORDER — LIDOCAINE HCL (PF) 1 % IJ SOLN
INTRAMUSCULAR | Status: AC
  Filled 2017-11-08: qty 30

## 2017-11-08 MED ORDER — ONDANSETRON HCL 4 MG/2ML IJ SOLN: 4.0000 mg | Freq: Four times a day (QID) | INTRAMUSCULAR | Status: DC | PRN

## 2017-11-08 SURGICAL SUPPLY — 9 items
CATH BALLN WEDGE 5F 110CM (CATHETERS) ×3 IMPLANT
GUIDEWIRE .025 260CM (WIRE) ×3 IMPLANT
PACK CARDIAC CATHETERIZATION (CUSTOM PROCEDURE TRAY) ×3 IMPLANT
PROTECTION STATION PRESSURIZED (MISCELLANEOUS) ×3
SHEATH GLIDE SLENDER 4/5FR (SHEATH) ×3 IMPLANT
STATION PROTECTION PRESSURIZED (MISCELLANEOUS) ×1 IMPLANT
TRANSDUCER W/STOPCOCK (MISCELLANEOUS) ×3 IMPLANT
TUBING ART PRESS 72  MALE/FEM (TUBING) ×2
TUBING ART PRESS 72 MALE/FEM (TUBING) ×1 IMPLANT

## 2017-11-08 NOTE — Telephone Encounter (Signed)
New Message:       Pt is calling and states she needs a sooner f/u appt that 12/14/17 so she can go over her heart cath results. Pt states she would like to get back to work and so she needs this appt asap.

## 2017-11-08 NOTE — Telephone Encounter (Signed)
Me       11/08/17 1:43 PM  Note    Spoke back with the pt to inform her that [place] advised on her work note, and this was faxed to her Employer at contact information provided by the pt. Pt verbalized understanding and agrees with this plan. Pt gracious for all the assistance provided.       Me       11/08/17 1:20 PM  Note    Pt would like the letter sent to [place]: Leave Dept at [phone].     Will fax work note per [employer], to the pts contact number listed above.       Me       11/08/17 1:20 PM  Note    I reviewed Right Heart cath, performed earlier today. She has mild pulmonary HTN. We will continue medical therapy with Lasix. No further cardiac w/u indicated at this time. She can be cleared to go back to work on Monday November 14, 2017, full duty w/o any work restrictions. Her FMLA will be completed by Dr. Meda Coffee on 11/14/2017.  Lyda Jester, [facility]

## 2017-11-08 NOTE — Telephone Encounter (Signed)
I reviewed Right Heart cath, performed earlier today. She has mild pulmonary HTN. We will continue medical therapy with Lasix.  No further cardiac w/u indicated at this time. She can be cleared to go back to work on Monday November 14, 2017, full duty w/o any work restrictions. Her FMLA will be completed by Dr. Meda Coffee on 11/14/2017.  Lyda Jester, [facility]

## 2017-11-08 NOTE — Telephone Encounter (Signed)
See phone note. This has already been taken care of by Winifred Olive, LPN.

## 2017-11-08 NOTE — Telephone Encounter (Signed)
Spoke back with the pt to inform her that [place] advised on her work note, and this was faxed to her Employer at contact information provided by the pt. Pt verbalized understanding and agrees with this plan. Pt gracious for all the assistance provided.

## 2017-11-08 NOTE — Telephone Encounter (Signed)
Will fax work note to the pts employer at contact number provided on 11/07/17.

## 2017-11-08 NOTE — Telephone Encounter (Signed)
I reviewed Right Heart cath, performed earlier today. She has mild pulmonary HTN. We will continue medical therapy with Lasix.  No further cardiac w/u indicated at this time. She can be cleared to go back to work on Monday November 14, 2017, full duty w/o any work restrictions. Her FMLA will be completed by Dr. Meda Coffee on 11/14/2017.  Lyda Jester, [facility]

## 2017-11-08 NOTE — Research (Signed)
RADPH Informed Consent   Subject Name: Melanie Hood  Subject met inclusion and exclusion criteria.  The informed consent form, study requirements and expectations were reviewed with the subject and questions and concerns were addressed prior to the signing of the consent form.  The subject verbalized understanding of the trail requirements.  The subject agreed to participate in the [facility] trial and signed the informed consent.  The informed consent was obtained prior to performance of any protocol-specific procedures for the subject.  A copy of the signed informed consent was given to the subject and a copy was placed in the subject's medical record.  [name] 11/08/2017, 8:45 AM

## 2017-11-08 NOTE — Telephone Encounter (Signed)
Pt would like the letter sent to [place]: Leave Dept at [phone].     Will fax work note per [employer], to the pts contact number listed above.

## 2017-11-08 NOTE — H&P (Signed)
Cath Lab Visit (complete for each Cath Lab visit)  Clinical Evaluation Leading to the Procedure:   ACS: No.  Non-ACS:    Anginal Classification: CCS III  Anti-ischemic medical therapy: Minimal Therapy (1 class of medications)  Non-Invasive Test Results: Low-risk stress test findings: cardiac mortality <1%/year  Prior CABG: No previous CABG       

## 2017-11-08 NOTE — Telephone Encounter (Signed)
Pt is getting a right heart cath done today, and per [place], the pts work note will be written based on her cath report.

## 2017-11-08 NOTE — Telephone Encounter (Signed)
Pt is calling in for 2 request:    1.) to make her post-cath follow-up appt with an Extender or Dr Janna Arch was performed today by Dr Tamala Julian. Scheduled the pt for her post-cath follow-up appt for 8/13 at 2 pm with Estella Husk PA-C.  Pt aware to arrive at 15 mins prior to this appt.  2.) Pt calling in to see if Ellen Henri PA-C read her cath report and discharge instructions from her right heart cath this morning. Pt states that she still needs Ellen Henri PA-C to advise on a return to work note (pt prefers going back on next Monday 8/5), with any restrictions mentioned in the note, to be written ASAP, so that we can fax this to her [facility].  Pt understands that her complete FMLA forms will not be signed off on until Dr Meda Coffee returns to the office on 11/14/17.  Informed the pt that [place] is in clinic today, and is aware of needed note.  Informed the pt that [place] will advise on the letter needed, when she looks at her cath report from this morning. Informed the pt that once letter is written, I will fax this to the number she provided yesterday, and call her shortly thereafter. Pt verbalized understanding and agrees with this plan.

## 2017-11-08 NOTE — Discharge Instructions (Signed)
Brachial Site Care Refer to this sheet in the next few weeks. These instructions provide you with information about caring for yourself after your procedure. Your health care provider may also give you more specific instructions. Your treatment has been planned according to current medical practices, but problems sometimes occur. Call your health care provider if you have any problems or questions after your procedure. What can I expect after the procedure? After your procedure, it is typical to have the following:  Bruising at the site that usually fades within 1-2 weeks.  Blood collecting in the tissue (hematoma) that may be painful to the touch. It should usually decrease in size and tenderness within 1-2 weeks.  Follow these instructions at home:  Take medicines only as directed by your health care provider.  You may shower 24-48 hours after the procedure or as directed by your health care provider. Remove the bandage (dressing) and gently wash the site with plain soap and water. Pat the area dry with a clean towel. Do not rub the site, because this may cause bleeding.  Do not take baths, swim, or use a hot tub until your health care provider approves.  Check your insertion site every day for redness, swelling, or drainage.  Do not apply powder or lotion to the site.  Do not flex or bend the affected arm for 24 hours or as directed by your health care provider.  Do not push or pull heavy objects with the affected arm for 24 hours or as directed by your health care provider.  Do not lift over 10 lb (4.5 kg) for 5 days after your procedure or as directed by your health care provider.  Ask your health care provider when it is okay to: ? Return to work or school. ? Resume usual physical activities or sports. ? Resume sexual activity.  Do not drive home if you are discharged the same day as the procedure. Have someone else drive you.  You may drive 24 hours after the procedure unless  otherwise instructed by your health care provider.  Do not operate machinery or power tools for 24 hours after the procedure.  If your procedure was done as an outpatient procedure, which means that you went home the same day as your procedure, a responsible adult should be with you for the first 24 hours after you arrive home.  Keep all follow-up visits as directed by your health care provider. This is important. Contact a health care provider if:  You have a fever.  You have chills.  You have increased bleeding from the radial site. Hold pressure on the site. Get help right away if:  You have unusual pain at the site.  You have redness, warmth, or swelling at the radial site.  You have drainage (other than a small amount of blood on the dressing) from the radial site.  The site is bleeding, and the bleeding does not stop after 30 minutes of holding steady pressure on the site.  Your arm or hand becomes pale, cool, tingly, or numb. This information is not intended to replace advice given to you by your health care provider. Make sure you discuss any questions you have with your health care provider. Document Released: 05/01/2010 Document Revised: 09/04/2015 Document Reviewed: 10/15/2013 Elsevier Interactive Patient Education  2018 [employer].

## 2017-11-09 ENCOUNTER — Encounter (HOSPITAL_COMMUNITY): Payer: Self-pay | Admitting: Interventional Cardiology

## 2017-11-16 ENCOUNTER — Telehealth: Payer: Self-pay | Admitting: Cardiology

## 2017-11-16 NOTE — Telephone Encounter (Signed)
New message    Patient calling to request a letter stating she can park closer to the building at her job. She does have a handicap placard, however there are several parking lots and her employer is requesting a letter stating she can park closer.

## 2017-11-16 NOTE — Telephone Encounter (Signed)
Spoke to patient who is inquiring about a letter allowing her to park closer to her facility due to her pulmonary hypertension, which leads to SOB.  I verified approval per Ellen Henri and she signed a letter.  The patient thanked [place] for our help.

## 2017-11-16 NOTE — Progress Notes (Signed)
Letter for work

## 2017-11-21 DIAGNOSIS — I5032 Chronic diastolic (congestive) heart failure: Secondary | ICD-10-CM | POA: Insufficient documentation

## 2017-11-21 NOTE — Progress Notes (Deleted)
Cardiology Office Note    Date:  11/21/2017   ID:  Melanie, Hood [birth year], MRN [id number]  PCP:  Cari Caraway, MD  Cardiologist: Ena Dawley, MD  No chief complaint on file.   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  Patient had recent trouble with dyspnea on exertion and leg edema.  Chest CT suggested possibly pulmonary artery hypertension.  2D echo normal LVEF 60 to 65% with grade 1 DD.  Pulmonary artery systolic blood pressure could not be accurately estimated.  Coronary CTA was normal calcium score 0 dilated pulmonary artery measuring 34 mmHg.  She improved with Lasix.  Right heart catheterization was performed 11/08/2017 and showed mild pulmonary hypertension based upon a mean pulmonary artery pressure of 30 mmHg and mildly elevated pulmonary capillary wedge pressure mean of 22 mmHg.  Patient did not take her diuretic that day.  Was felt left ventricular diastolic dysfunction was the cause of her pulmonary hypertension.    Past Medical History:  Diagnosis Date  . Allergic rhinitis   . Arthritis   . Chronic back pain   . Diabetes mellitus    type 11   . Diverticular disease of colon   . Exercise-induced asthma   . GERD (gastroesophageal reflux disease)   . Graves disease    status post radioactive iodine treatment  . Heart disease   . Hypertension    mild left vent dysfunction   . Hypothyroidism   . Obesity   . Sleep apnea    cpap-automatic settings  . Vein, varicose     Past Surgical History:  Procedure Laterality Date  . ABDOMINAL HYSTERECTOMY    . APPENDECTOMY    . BREAST BIOPSY Left 1994  . CHOLECYSTECTOMY    . COLONOSCOPY N/A 07/18/2015   Procedure: COLONOSCOPY;  Surgeon: Teena Irani, MD;  Location: [facility];  Service: Endoscopy;  Laterality: N/A;  . ESOPHAGOGASTRODUODENOSCOPY N/A 07/18/2015   Procedure: ESOPHAGOGASTRODUODENOSCOPY (EGD);   Surgeon: Teena Irani, MD;  Location: [facility];  Service: Endoscopy;  Laterality: N/A;  . EYE SURGERY    . FOOT ARTHROPLASTY     rt foot as child  . KNEE ARTHROSCOPY  04/20/2011   Procedure: ARTHROSCOPY KNEE;  Surgeon: Hessie Dibble, MD;  Location: [place];  Service: Orthopedics;  Laterality: Right;  right knee arthroscopy partial medial menisectomy and chondroplasty.  Marland Kitchen RIGHT HEART CATH N/A 11/08/2017   Procedure: RIGHT HEART CATH;  Surgeon: Belva Crome, MD;  Location: [facility];  Service: Cardiovascular;  Laterality: N/A;  . TOTAL KNEE ARTHROPLASTY Right 12/11/2013   Procedure: TOTAL KNEE ARTHROPLASTY;  Surgeon: Hessie Dibble, MD;  Location: [facility];  Service: Orthopedics;  Laterality: Right;  . TOTAL KNEE ARTHROPLASTY Left 02/05/2014   Procedure: LEFT TOTAL KNEE ARTHROPLASTY;  Surgeon: Hessie Dibble, MD;  Location: [place];  Service: Orthopedics;  Laterality: Left;  . UPPER GASTROINTESTINAL ENDOSCOPY    . VARICOSE VEIN SURGERY      Current Medications: No outpatient medications have been marked as taking for the 11/22/17 encounter (Appointment) with Imogene Burn, PA-C.     Allergies:   Septra [sulfamethoxazole w/trimethoprim (co-trimoxazole)]; Metoprolol tartrate; Minocycline; Codeine; Erythromycin; Nsaids; Septra [sulfamethoxazole-trimethoprim]; and Tetracyclines & related   Social History   Socioeconomic History  . Marital status: Single    Spouse name: Not on file  .  Number of children: Not on file  . Years of education: Not on file  . Highest education level: Not on file  Occupational History  . Not on file  Social Needs  . Financial resource strain: Not on file  . Food insecurity:    Worry: Not on file    Inability: Not on file  . Transportation needs:    Medical: Not on file    Non-medical: Not on file  Tobacco Use  . Smoking status: Former Smoker    Last attempt to quit: 04/12/1986    Years since quitting: 31.6  . Smokeless  tobacco: Never Used  Substance and Sexual Activity  . Alcohol use: Yes    Alcohol/week: 0.0 standard drinks    Comment: rarely   . Drug use: No  . Sexual activity: Not on file  Lifestyle  . Physical activity:    Days per week: Not on file    Minutes per session: Not on file  . Stress: Not on file  Relationships  . Social connections:    Talks on phone: Not on file    Gets together: Not on file    Attends religious service: Not on file    Active member of club or organization: Not on file    Attends meetings of clubs or organizations: Not on file    Relationship status: Not on file  Other Topics Concern  . Not on file  Social History Narrative  . Not on file     Family History:  The patient's ***family history includes Colon cancer in her sister; Diabetes in her father and mother.   ROS:   Please see the history of present illness.    ROS All other systems reviewed and are negative.   PHYSICAL EXAM:   VS:  There were no vitals taken for this visit.  Physical Exam  GEN: Well nourished, well developed, in no acute distress  HEENT: normal  Neck: no JVD, carotid bruits, or masses Cardiac:RRR; no murmurs, rubs, or gallops  Respiratory:  clear to auscultation bilaterally, normal work of breathing GI: soft, nontender, nondistended, + BS Ext: without cyanosis, clubbing, or edema, Good distal pulses bilaterally MS: no deformity or atrophy  Skin: warm and dry, no rash Neuro:  Alert and Oriented x 3, Strength and sensation are intact Psych: euthymic mood, full affect  Wt Readings from Last 3 Encounters:  11/08/17 (!) 330 lb (149.7 kg)  10/25/17 (!) 336 lb (152.4 kg)  10/11/17 (!) 336 lb (152.4 kg)      Studies/Labs Reviewed:   EKG:  EKG is*** ordered today.  The ekg ordered today demonstrates ***  Recent Labs: 10/06/2017: NT-Pro BNP 77 10/07/2017: TSH 0.753 10/25/2017: BUN 11; Creatinine, Ser 0.88; Hemoglobin 13.2; Platelets 243; Potassium 4.8; Sodium 140   Lipid  Panel No results found for: CHOL, TRIG, HDL, CHOLHDL, VLDL, LDLCALC, LDLDIRECT  Additional studies/ records that were reviewed today include:  Coronary CT 10/24/2017  IMPRESSION: 1. Coronary calcium score of 0. This was 0 percentile for age and sex matched control.   2. Normal coronary origin with right dominance.   3. No evidence of CAD.   4. Dilated pulmonary artery measuring 34 mm.    CT of the chest 6/28/2019IMPRESSION: 1. Negative for acute pulmonary embolus, pneumonia or other acute cardiopulmonary process. 2. Mild dependent atelectasis bilaterally. 3. Enlarged main pulmonary artery at 3.4 cm in diameter. Similar findings can be seen in the setting of pulmonary arterial hypertension.  Electronically Signed   By: Jacqulynn Cadet M.D.   Right heart cath 7/30/2019Mild pulmonary hypertension based upon a mean pulmonary artery pressure of 30 mmHg.  Mildly elevated pulmonary capillary wedge pressure mean of 22 mmHg (patient did not have diuretic therapy this morning)  Oxymetry demonstrates no evidence of left to right shunting  Cardiac output 6.84 L/min and cardiac index 2.65 L/min/m   RECOMMENDATIONS:    Findings suggest left ventricular diastolic dysfunction as the cause of the patient's pulmonary hypertension.   2D echo 10/17/2017 Study Conclusions   - Left ventricle: The cavity size was normal. There was mild focal   basal hypertrophy of the septum. Systolic function was normal.   The estimated ejection fraction was in the range of 60% to 65%.   Wall motion was normal; there were no regional wall motion   abnormalities. There was an increased relative contribution of   atrial contraction to ventricular filling. Doppler parameters are   consistent with abnormal left ventricular relaxation (grade 1   diastolic dysfunction). - Mitral valve: Calcified annulus. - Right ventricle: The cavity size was mildly dilated. Wall   thickness was normal. - Pulmonary  arteries: Systolic pressure could not be accurately   estimated.   Impressions:   - NL LVF, grade 1 diastolic dysfunction, mild BSH, trivial PR,   mildly dilated RV.   ASSESSMENT:    No diagnosis found.   PLAN:  In order of problems listed above:      Medication Adjustments/Labs and Tests Ordered: Current medicines are reviewed at length with the patient today.  Concerns regarding medicines are outlined above.  Medication changes, Labs and Tests ordered today are listed in the Patient Instructions below. There are no Patient Instructions on file for this visit.   Signed, Ermalinda Barrios, PA-C  11/21/2017 3:24 PM    [facility] [address], [place], [place]  [address] Phone: [phone]; Fax: [phone]

## 2017-11-22 ENCOUNTER — Ambulatory Visit: Payer: PRIVATE HEALTH INSURANCE | Admitting: Physician Assistant

## 2017-11-22 ENCOUNTER — Telehealth: Payer: Self-pay | Admitting: *Deleted

## 2017-11-22 NOTE — Telephone Encounter (Signed)
-----   Message from Dorothy Spark, MD sent at 11/16/2017  3:35 PM EDT ----- Regarding: RE: fmla papers complete on 8/5 I have filled her FMLA paperwork, please also tell her that based on her cath results, I would add spironolactone 12.5 mg po daily to her regimen, please check her labs at the next appointment. Thank you, KN   ----- Message ----- From: [name], LPN Sent: [id number]/1191  11:28 AM To: Dorothy Spark, MD Subject: fmla papers complete on 8/5                    Dr. Meda Coffee, this pt needs her FMLA papers in your folder completed and done on 11/14/17.  Please give to Maudie Mercury Meisimore in medical records once done, so she can send this to her employer [employer].  She had right heart cath on 7/30. Ellen Henri can write temporary work note, until you can complete FMLA paperwork on 11/14/17.  I'm out of the office so this has to get back to medical records on this date.   Thanks,  [facility]

## 2017-11-22 NOTE — Progress Notes (Signed)
Patient ID: Melanie Hood, female   DOB: Melanie [birth year], 62 y.o.   MRN: [id number]      Cardiology Office Note  Date:  8/Hood/2019   ID:  Melanie Hood, Melanie Hood, Melanie Hood, MRN [id number]  PCP:  Cari Caraway, MD  Cardiologist:  Ena Dawley, MD   History of Present Illness: Melanie Hood is a 62 y.o. female with h/o obesity. DM, hypertension, pulmonary hypertension, sleep apnea on CPAP and hyperlipidemia. She is a very motivated patient who exercises a lot, she works as a Marine scientist and is very educated about cardiac diet and exercise. In the past she lost 70 lbs, but then lost her grandson and was depressed. She recently lost her sister.  10/25/2017 - seen by Lyda Jester for DOE, LE edema and tachycardia. Recent prolonged travel. A family member recently passed away and she had to make 2 round trips from [place] to [place], in the course of just 6 days. Her left leg felt tight. She noted change in breathing. SOB walking short distances, which is new. No resting dyspnea. No CP. EKG showed NSR 79 bpm, but pt noted HR jumps into the 140s on her apple watch with ambulation. She has a family h/o blood clots. Her daughter has had both DVT and PE.D-dimer was elevated at 1.64. Subsequently, she was referred for bilateral LE venous dopplers as well as for chest CT to r/o DVT/PE. Both studies were negative. However chest CT suggested possible pulmonary artery HTN. She was given a dose of IV Lasix in the ED, when she went for her CT scan and she was instructed to f/u in our clinic. She was seen by an APP at [facility] on 10/11/17. PO lasix was increased from 20 mg to 40 mg daily, as pt was still SOB. Case was discussed with Dr. Gwenlyn Found, DOD, who recommended 2D echo and coronary CTA. 2D echo showed normal EF at 60-65%, G1DD, normal wall motion. Unfortunately, pulmonary artery systolic BP could not be accurately estimated. Coronary CTA was normal. No evidence of CAD. Calcium score was 0. Dilated pulmonary artery measuring 34 mm  was noted. She underwent right sided cath that showed : Findings suggest left ventricular diastolic dysfunction as the cause of the patient's pulmonary hypertension.  11/23/2017 -the patient is coming back and states that she is feeling significantly better, her lower extremity edema has improved with Lasix, she complains of ongoing shortness of breath with short distances like walking from the parking lot to our clinic and increased heart rate on her heart rate monitor on her wrist watch.  No symptoms of chest pain no dizziness no syncope she tries to exercise 3 times a week on a treadmill at home.  Past Medical History:  Diagnosis Date  . Allergic rhinitis   . Arthritis   . Chronic back pain   . Diabetes mellitus    type 11   . Diverticular disease of colon   . Exercise-induced asthma   . GERD (gastroesophageal reflux disease)   . Graves disease    status post radioactive iodine treatment  . Heart disease   . Hypertension    mild left vent dysfunction   . Hypothyroidism   . Obesity   . Sleep apnea    cpap-automatic settings  . Vein, varicose     Past Surgical History:  Procedure Laterality Date  . ABDOMINAL HYSTERECTOMY    . APPENDECTOMY    . BREAST BIOPSY Left 1994  . CHOLECYSTECTOMY    . COLONOSCOPY N/A  07/18/2015   Procedure: COLONOSCOPY;  Surgeon: Teena Irani, MD;  [facility]: [facility];  Service: Endoscopy;  Laterality: N/A;  . ESOPHAGOGASTRODUODENOSCOPY N/A 07/18/2015   Procedure: ESOPHAGOGASTRODUODENOSCOPY (EGD);  Surgeon: Teena Irani, MD;  Location: [facility];  Service: Endoscopy;  Laterality: N/A;  . EYE SURGERY    . FOOT ARTHROPLASTY     rt foot as child  . KNEE ARTHROSCOPY  04/20/2011   Procedure: ARTHROSCOPY KNEE;  Surgeon: Hessie Dibble, MD;  Location: [facility];  Service: Orthopedics;  Laterality: Right;  right knee arthroscopy partial medial menisectomy and chondroplasty.  Marland Kitchen RIGHT HEART CATH N/A 11/08/2017   Procedure: RIGHT HEART CATH;   Surgeon: Belva Crome, MD;  Location: [facility];  Service: Cardiovascular;  Laterality: N/A;  . TOTAL KNEE ARTHROPLASTY Right 12/11/2013   Procedure: TOTAL KNEE ARTHROPLASTY;  Surgeon: Hessie Dibble, MD;  Location: [facility];  Service: Orthopedics;  Laterality: Right;  . TOTAL KNEE ARTHROPLASTY Left 02/05/2014   Procedure: LEFT TOTAL KNEE ARTHROPLASTY;  Surgeon: Hessie Dibble, MD;  Location: [facility];  Service: Orthopedics;  Laterality: Left;  . UPPER GASTROINTESTINAL ENDOSCOPY    . VARICOSE VEIN SURGERY       Current Outpatient Medications  Medication Sig Dispense Refill  . acetaminophen (TYLENOL) 650 MG CR tablet Take 650 mg by mouth every 12 (twelve) hours.    Marland Kitchen albuterol (PROVENTIL HFA;VENTOLIN HFA) 108 (90 BASE) MCG/ACT inhaler Inhale 2 puffs into the lungs every 6 (six) hours as needed for wheezing or shortness of breath.    Marland Kitchen aspirin 81 MG tablet Take 81 mg by mouth every evening.     Marland Kitchen atorvastatin (LIPITOR) 10 MG tablet Take 10 mg by mouth every evening.     . Biotin w/ Vitamins C & E (HAIR/SKIN/NAILS PO) Take by mouth daily.    . cetirizine (ZYRTEC) 10 MG tablet Take 10 mg by mouth every evening.     . Cholecalciferol (VITAMIN D-3) 5000 UNITS TABS Take 5,000 Units by mouth every evening.     . Chromium-Cinnamon [phone] MCG-MG CAPS Take 1 capsule by mouth 2 (two) times daily.     . citalopram (CELEXA) 20 MG tablet Take 20 mg by mouth every evening.     Marland Kitchen CRANBERRY PO Take by mouth daily.    . Cyanocobalamin (B-12) 1000 MCG SUBL Place 1,000 mcg under the tongue daily.    . cyclobenzaprine (FLEXERIL) 5 MG tablet Take 5 mg by mouth 3 (three) times daily as needed for muscle spasms.    Marland Kitchen diltiazem (CARDIZEM LA) 120 MG 24 hr tablet Take 120 mg by mouth every evening.     . fluticasone (FLONASE) 50 MCG/ACT nasal spray Place 2 sprays into both nostrils every morning.     . Fluticasone-Salmeterol (ADVAIR DISKUS) 250-50 MCG/DOSE AEPB Take 1 puff by mouth daily as needed.     .  furosemide (LASIX) 40 MG tablet Take 1 tablet (40 mg total) by mouth daily. 90 tablet 3  . gabapentin (NEURONTIN) 600 MG tablet Take 1 tablet by mouth 4 (four) times daily.  1  . hydrochlorothiazide (MICROZIDE) 12.5 MG capsule Take 12.5 mg by mouth every evening.     Marland Kitchen ketoconazole (NIZORAL) 2 % shampoo Apply 1 application topically as needed for itching.  1  . levothyroxine (SYNTHROID) 175 MCG tablet Take 175 mcg by mouth daily before breakfast.     . lisinopril (PRINIVIL,ZESTRIL) 10 MG tablet Take 10 mg by mouth at bedtime.     Marland Kitchen  Magnesium Oxide (MAG-OX PO) Take 500 mg by mouth daily.    . metFORMIN (GLUMETZA) 500 MG (MOD) 24 hr tablet Take 500 mg by mouth every evening.    . pantoprazole (PROTONIX) 40 MG tablet Take 40 mg by mouth daily as needed.     . ranitidine (ZANTAC) 150 MG tablet Take 1 tablet by mouth as needed.  0  . therapeutic multivitamin-minerals (THERAGRAN-M) tablet Take 1 tablet by mouth every morning.     . Turmeric 500 MG CAPS Take 500 mg by mouth daily.    . valACYclovir (VALTREX) 500 MG tablet Take 500 mg by mouth every evening.      No current facility-administered medications for this visit.     Allergies:   Septra [sulfamethoxazole w/trimethoprim (co-trimoxazole)]; Metoprolol tartrate; Minocycline; Codeine; Erythromycin; Nsaids; Septra [sulfamethoxazole-trimethoprim]; and Tetracyclines & related    Social History:  The patient  reports that she quit smoking about 31 years ago. She has never used smokeless tobacco. She reports that she drinks alcohol. She reports that she does not use drugs.   Family History:  The patient's family history includes Colon cancer in her sister; Diabetes in her father and mother.    ROS:  Please see the history of present illness.   Otherwise, review of systems are positive for none.   All other systems are reviewed and negative.    PHYSICAL EXAM: VS:  There were no vitals taken for this visit. , BMI There is no height or weight on  file to calculate BMI. GEN: Well nourished, well developed, in no acute distress  HEENT: normal  Neck: no JVD, carotid bruits, or masses Cardiac: RRR; no murmurs, rubs, or gallops, mild residual edema around the ankles. Respiratory:  clear to auscultation bilaterally, normal work of breathing GI: soft, nontender, nondistended, + BS MS: no deformity or atrophy  Skin: warm and dry, no rash Neuro:  Strength and sensation are intact Psych: euthymic mood, full affect   EKG:  EKG is ordered today. The ekg ordered today demonstrates SR, normal ECG   Recent Labs: 10/06/2017: NT-Pro BNP 77 10/07/2017: TSH 0.753 10/25/2017: BUN 11; Creatinine, Ser 0.88; Hemoglobin Hood.2; Platelets 243; Potassium 4.8; Sodium 140    Lipid Panel No results found for: CHOL, TRIG, HDL, CHOLHDL, VLDL, LDLCALC, LDLDIRECT    Wt Readings from Last 3 Encounters:  11/08/17 (!) 330 lb (149.7 kg)  10/25/17 (!) 336 lb (152.4 kg)  10/11/17 (!) 336 lb (152.4 kg)    Other studies Reviewed: Lipids from [facility]: LDL 95, HDL 62, TG 65  TTE: 02/04/2015 - Left ventricle: The cavity size was normal. Wall thickness was   increased in a pattern of moderate LVH. Systolic function was   normal. The estimated ejection fraction was in the range of 55%   to 65%. Wall motion was normal; there were no regional wall   motion abnormalities. There is diastolic dysfunction with   indeterminate LV filling pressure. - Mitral valve: Mildly thickened leaflets . There was trivial   regurgitation. - Left atrium: The atrium was normal in size. - Right atrium: The atrium was mildly dilated.  Impressions:  - LVEF 60-65%, moderate LVH, diastolic dysfunction, normal wall   motion, trivial MR, mild RAE    ASSESSMENT AND PLAN:  1.  Dyspnea on exertion -multifactorial secondary to deconditioning, obesity, mild pulmonary hypertension secondary to diastolic dysfunction obesity and deconditioning.  She is motivated to lose weight, we have  made a goal of 20 pounds in 3 months,  her total goal is to lose down to 180 pounds.  She has previously been successful with her exercise and dietary changes.  2.  Sinus tachycardia, on minimal exertion secondary to deconditioning, will increase Cardizem CD to 180 mg daily.  3. Hypertension -decrease lisinopril to 2.5 mg daily as we are increasing Cardizem.  4. Hyperlipidemia - LDL 95, goal < 70, on atorvastatin 10 mg po daily, since then she is significantly changed her diet and exercise, this is being followed by her primary care physician and has been at goal.   5. Obesity - she is motivated to loose weight, encouraged that she is doing great job.  6. LE edema - lasix 40 mg po daily, with mild residual edema..  Follow up in 3 months.  Signed, Ena Dawley, MD  8/Hood/2019 9:09 PM    [address], [place], [place]  [address] Phone: [phone]; Fax: [phone]

## 2017-11-22 NOTE — Telephone Encounter (Signed)
Pt will come in to see Dr Meda Coffee tomorrow 8/14 at 1040 slot, and new med changes will be endorsed then.  Labs will be arranged at that time as well.  Pt made aware of appt date and time.

## 2017-11-23 ENCOUNTER — Encounter: Payer: Self-pay | Admitting: Cardiology

## 2017-11-23 ENCOUNTER — Ambulatory Visit: Payer: PRIVATE HEALTH INSURANCE | Admitting: Cardiology

## 2017-11-23 VITALS — BP 114/70 | HR 81 | Ht 70.0 in | Wt 332.4 lb

## 2017-11-23 DIAGNOSIS — E782 Mixed hyperlipidemia: Secondary | ICD-10-CM

## 2017-11-23 DIAGNOSIS — I5032 Chronic diastolic (congestive) heart failure: Secondary | ICD-10-CM | POA: Diagnosis not present

## 2017-11-23 DIAGNOSIS — I272 Pulmonary hypertension, unspecified: Secondary | ICD-10-CM

## 2017-11-23 DIAGNOSIS — I10 Essential (primary) hypertension: Secondary | ICD-10-CM | POA: Diagnosis not present

## 2017-11-23 DIAGNOSIS — R0609 Other forms of dyspnea: Secondary | ICD-10-CM

## 2017-11-23 DIAGNOSIS — R0600 Dyspnea, unspecified: Secondary | ICD-10-CM

## 2017-11-23 DIAGNOSIS — R000 Tachycardia, unspecified: Secondary | ICD-10-CM

## 2017-11-23 MED ORDER — DILTIAZEM HCL ER COATED BEADS 180 MG PO CP24: 180.0000 mg | ORAL_CAPSULE | Freq: Every day | ORAL | 2 refills | Status: DC

## 2017-11-23 MED ORDER — LISINOPRIL 2.5 MG PO TABS: 2.5000 mg | ORAL_TABLET | Freq: Every day | ORAL | 2 refills | Status: DC

## 2017-11-23 NOTE — Patient Instructions (Signed)
Medication Instructions:   DECREASE YOUR LISINOPRIL TO 2.5 MG ONCE DAILY  INCREASE YOUR CARDIZEM CD TO 180 MG ONCE DAILY     Follow-Up:  3 MONTHS WITH DR Meda Coffee       If you need a refill on your cardiac medications before your next appointment, please call your pharmacy.

## 2017-12-21 ENCOUNTER — Telehealth: Payer: Self-pay | Admitting: Cardiology

## 2017-12-21 NOTE — Telephone Encounter (Signed)
Ibuprofen is acceptable but she should alternate with tylenol

## 2017-12-21 NOTE — Telephone Encounter (Signed)
Patient has been working out for the past two weeks and has had soreness to where she needs pain medication other than her tylenol 650 mg. She had been taking 600 mg of Motrin, once a day. She asked if this medication was safe to use with her cardiac history and asked if there was any additional antiinflammatory drugs she should use.   Sending to Dr. Meda Coffee for recommendations.

## 2017-12-21 NOTE — Telephone Encounter (Signed)
Left the patient a detailed message about Dr. Francesca Oman recommendations, per [facility]. Advised the patient to contact the office if any further questions.

## 2017-12-21 NOTE — Telephone Encounter (Signed)
New Message   Pt c/o medication issue:  1. Name of Medication: Anti inflammatory   2. How are you currently taking this medication (dosage and times per day)? n/a  3. Are you having a reaction (difficulty breathing--STAT)? n/a  4. What is your medication issue? Pt states she is wanting some advice on what kind of anti inflammatory meds she can take because she is trying to exercise but she has some pain when doing so. Please call

## 2018-03-02 ENCOUNTER — Ambulatory Visit: Payer: PRIVATE HEALTH INSURANCE | Admitting: Cardiology

## 2018-03-30 ENCOUNTER — Ambulatory Visit: Payer: Self-pay | Admitting: Cardiology

## 2018-08-31 ENCOUNTER — Other Ambulatory Visit (HOSPITAL_COMMUNITY)
Admission: RE | Admit: 2018-08-31 | Discharge: 2018-08-31 | Disposition: A | Discharge status: Home / Self Care | Payer: 59 | Source: Ambulatory Visit | Attending: Family Medicine | Admitting: Family Medicine

## 2018-08-31 ENCOUNTER — Other Ambulatory Visit: Payer: Self-pay | Admitting: Family Medicine

## 2018-08-31 DIAGNOSIS — Z124 Encounter for screening for malignant neoplasm of cervix: Secondary | ICD-10-CM | POA: Insufficient documentation

## 2018-09-06 LAB — CYTOLOGY - PAP
Diagnosis: NEGATIVE
HPV: NOT DETECTED

## 2018-09-22 ENCOUNTER — Telehealth: Payer: Self-pay | Admitting: Cardiology

## 2018-09-22 ENCOUNTER — Encounter: Payer: Self-pay | Admitting: *Deleted

## 2018-09-22 NOTE — Telephone Encounter (Signed)
Virtual Visit Pre-Appointment Phone Call  "(Name), I am calling you today to discuss your upcoming appointment. We are currently trying to limit exposure to the virus that causes COVID-19 by seeing patients at home rather than in the office."  1. "What is the BEST phone number to call the day of the visit?" - include this in appointment notes-YES UPDATED  2. Do you have or have access to (through a family member/friend) a smartphone with video capability that we can use for your visit?" a. If yes - list this number in appt notes as cell (if different from BEST phone #) and list the appointment type as a VIDEO visit in appointment notes-UPDATED   3. Confirm consent - "In the setting of the current Covid19 crisis, you are scheduled for a ( video) visit with Dr. Meda Coffee 10/04/18 at 0800.  Just as we do with many in-office visits, in order for you to participate in this visit, we must obtain consent.  If you'd like, I can send this to your mychart (if signed up) or email for you to review.  Otherwise, I can obtain your verbal consent now.  All virtual visits are billed to your insurance company just like a normal visit would be.  By agreeing to a virtual visit, we'd like you to understand that the technology does not allow for your provider to perform an examination, and thus may limit your provider's ability to fully assess your condition. If your provider identifies any concerns that need to be evaluated in person, we will make arrangements to do so.  Finally, though the technology is pretty good, we cannot assure that it will always work on either your or our end, and in the setting of a video visit, we may have to convert it to a phone-only visit.  In either situation, we cannot ensure that we have a secure connection.  Are you willing to proceed?" STAFF: Did the patient verbally acknowledge consent to telehealth visit? Document YES/NO here: YES PT GAVE VERBAL CONSENT AS WELL AS CONSENT TO TREAT  SENT TO HER MYCHART TO REVIEW.   4. Advise patient to be prepared - "Two hours prior to your appointment, go ahead and check your blood pressure, pulse, oxygen saturation, and your weight (if you have the equipment to check those) and write them all down. When your visit starts, your provider will ask you for this information. If you have an Apple Watch or Kardia device, please plan to have heart rate information ready on the day of your appointment. Please have a pen and paper handy nearby the day of the visit as well."-YES PT WILL OBTAIN  5. Give patient instructions for  smartphone Doxy.me as below if video visit (depending on what platform provider is using)-YES PROVIDED  6. Inform patient they will receive a phone call 15 minutes prior to their appointment time (may be from unknown caller ID) so they should be prepared to [place] NOTE  Melanie Hood has been deemed a candidate for a follow-up tele-health visit to limit community exposure during the Covid-19 pandemic. I spoke with the patient via phone to ensure availability of phone/video source, confirm preferred email & phone number, and discuss instructions and expectations.  I reminded Melanie Hood to be prepared with any vital sign and/or heart rhythm information that could potentially be obtained via home monitoring, at the time of her visit. I reminded Melanie Hood to expect a  phone call prior to her visit.  [name], LPN 7/67/3419 37:90 AM     IF USING  [email or web address].ME - The patient will receive a link just prior to their visit by text.-PT AWARE     FULL LENGTH CONSENT FOR TELE-HEALTH VISIT   I hereby voluntarily request, consent and authorize [facility] and its employed or contracted physicians, physician assistants, nurse practitioners or other licensed health care professionals (the Practitioner), to provide me with telemedicine health care services (the Services") as deemed necessary by the  treating Practitioner. I acknowledge and consent to receive the Services by the Practitioner via telemedicine. I understand that the telemedicine visit will involve communicating with the Practitioner through live audiovisual communication technology and the disclosure of certain medical information by electronic transmission. I acknowledge that I have been given the opportunity to request an in-person assessment or other available alternative prior to the telemedicine visit and am voluntarily participating in the telemedicine visit.  I understand that I have the right to withhold or withdraw my consent to the use of telemedicine in the course of my care at any time, without affecting my right to future care or treatment, and that the Practitioner or I may terminate the telemedicine visit at any time. I understand that I have the right to inspect all information obtained and/or recorded in the course of the telemedicine visit and may receive copies of available information for a reasonable fee.  I understand that some of the potential risks of receiving the Services via telemedicine include:   Delay or interruption in medical evaluation due to technological equipment failure or disruption;  Information transmitted may not be sufficient (e.g. poor resolution of images) to allow for appropriate medical decision making by the Practitioner; and/or   In rare instances, security protocols could fail, causing a breach of personal health information.  Furthermore, I acknowledge that it is my responsibility to provide information about my medical history, conditions and care that is complete and accurate to the best of my ability. I acknowledge that Practitioner's advice, recommendations, and/or decision may be based on factors not within their control, such as incomplete or inaccurate data provided by me or distortions of diagnostic images or specimens that may result from electronic transmissions. I understand  that the practice of medicine is not an exact science and that Practitioner makes no warranties or guarantees regarding treatment outcomes. I acknowledge that I will receive a copy of this consent concurrently upon execution via email to the email address I last provided but may also request a printed copy by calling the office of [place].    I understand that my insurance will be billed for this visit.   I have read or had this consent read to me.  I understand the contents of this consent, which adequately explains the benefits and risks of the Services being provided via telemedicine.   I have been provided ample opportunity to ask questions regarding this consent and the Services and have had my questions answered to my satisfaction.  I give my informed consent for the services to be provided through the use of telemedicine in my medical care  By participating in this telemedicine visit I agree to the above. PT GAVE VERBAL CONSENT TO TREAT AS WELL AS CONSENT TO TREAT SENT TO THE PTS MYCHART TO REVIEW.

## 2018-09-22 NOTE — Telephone Encounter (Signed)
New Message:    Patient calling trying to get a appt for medical clearance and I see some TOC but it will not let me put the patient in. Please call patient concerning getting a appt. patient states a message can left on her vm.

## 2018-10-02 ENCOUNTER — Telehealth: Payer: Self-pay | Admitting: *Deleted

## 2018-10-03 ENCOUNTER — Telehealth: Payer: Self-pay | Admitting: *Deleted

## 2018-10-03 NOTE — Telephone Encounter (Signed)
Pt called back and said she will have to cancel her appt with Dr Meda Coffee for tomorrow at 0800, due to a work mandatory meeting she has in the morning and then has to work the rest of the day. 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  Pt is only requesting a visit on a Tuesday, due to conflicting work schedules,  and is ok with seeing Dr Francesca Oman PA-C.  Rescheduled the pt to see [name] PA-C for Tuesday 10/17/18 at 0930. Pt needs this for surgical clearance for her thumb surgery that is tentatively scheduled for the end of July.  Offered the pt other dates and times but this is the only time she could commit to.  Informed the pt that we will call her prior to that appt to do another covid pre-screen. Pt verbalized understanding and agrees with this plan. Pt was very apologetic and was very gracious for all the assistance and understanding.

## 2018-10-03 NOTE — Telephone Encounter (Signed)
    COVID-19 Pre-Screening Questions:  . In the past 7 to 10 days have you had a cough,  shortness of breath, headache, congestion, fever (100 or greater) body aches, chills, sore throat, or sudden loss of taste or sense of smell?-NO . Have you been around anyone with known Covid 19.-NO . Have you been around anyone who is awaiting Covid 19 test results in the past 7 to 10 days?-NO . Have you been around anyone who has been exposed to Covid 19, or has mentioned symptoms of Covid 19 within the past 7 to 10 days?-NO   Pt covid pre-screen questions were done and answered "no" to all.  Pt is aware of no visitor policy and to wear her face mask at all times to this appt.  Pt verbalized understanding and agrees with this plan.

## 2018-10-03 NOTE — Telephone Encounter (Signed)
Left the pt a detailed message to call the office back and ask for myself or Dr. Francesca Oman CMA Bevelyn Buckles, so that we can do COVID PRE-SCREENING QUESTIONS on the pt, per protocol, before her OV appt with Dr Meda Coffee tomorrow 10/04/18 at 0800.

## 2018-10-04 ENCOUNTER — Ambulatory Visit: Payer: 59 | Admitting: Cardiology

## 2018-10-05 ENCOUNTER — Other Ambulatory Visit: Payer: Self-pay | Admitting: Family Medicine

## 2018-10-05 DIAGNOSIS — Z1231 Encounter for screening mammogram for malignant neoplasm of breast: Secondary | ICD-10-CM

## 2018-10-12 ENCOUNTER — Telehealth: Payer: Self-pay

## 2018-10-12 NOTE — Telephone Encounter (Signed)
   [facility] Pre-operative Risk Assessment    Request for surgical clearance:  1. What type of surgery is being performed? Right trigger thumb release    2. When is this surgery scheduled? TBD   3. What type of clearance is required (medical clearance vs. Pharmacy clearance to hold med vs. Both)? Medical clearance   4. Are there any medications that need to be held prior to surgery and how long? Not specified (the patient is on ASA)    5. Practice name and name of physician performing surgery? [facility]// Dr. Rhona Raider   6. What is your office phone number: [phone]    7.   What is your office fax number: [phone] attn: Marlin Canary   8.   Anesthesia type (None, local, MAC, general) ? Not specified

## 2018-10-16 ENCOUNTER — Telehealth: Payer: Self-pay | Admitting: Physician Assistant

## 2018-10-16 ENCOUNTER — Encounter: Payer: Self-pay | Admitting: Physician Assistant

## 2018-10-16 NOTE — Telephone Encounter (Signed)
New Message ° ° ° °Left message to confirm appt and answer covid questions  °

## 2018-10-16 NOTE — Progress Notes (Signed)
Cardiology Office Note    Date:  10/17/2018  ID:  Melanie Hood, Melanie Hood 03-21-1956, MRN [id number] PCP:  Cari Caraway, MD  Cardiologist:  Ena Dawley, MD   Chief Complaint: pre-op cardiovascular clearance  History of Present Illness:  Melanie Hood is a 63 y.o. female with history of chronic diastolic CHF, pulmonary HTN, morbid obesity, DM, HTN, sleep apnea on CPAP, HLD (not followed by our office), prior situational depression, exercise induced asthma who presents for f/u and medical clearance for surgery.  In 10/2017 she had been seen for DOE, LE edema, and tachycardia with ambulation.D-dimer was elevated at 1.64. LE venous duplex was negative for DVT. CT angio 09/2017 showed no PE or acute process, enlarged main pulmonary artery. 2D echo 10/2017 showed normal EF at 60-65%, G1DD, normal wall motion - unfortunately, pulmonary artery systolic BP could not be accurately estimated. Coronary CTA was normal. She underwent right heart cath 11/08/17 showing mild pulmonary HTN, mildly elevated pulmonary capillary wedge pressure mean of 22 mmHg, no L-R shunting - findings suggested left ventricular diastolic dysfunction as the cause of the patient's pulmonary hypertension. F/u labs 10/2017 showed K 4.8, Cr 0.88, glucose 113, normal CBC, 09/2017 TSH wnl. She had labs by PCP on her portal in 08/2018 showing K 4.2, Cr 0.98.  She returns for follow-up for preop clearance for thumb surgery. Her weight is up from prior but she says she actually is back down compared to where she was a month ago. She fell off the wagon with activity when she lost her job and gained quite a bit. She is now re-employed as a Marine scientist and exercising several days a week doing the TM, bike and chair stretching. She exerts over 4 METS without angina or unusual dyspnea. She has stiff bilateral LE edema which she states is chronic and stable appearing following knee surgery. She says it looks exactly like it had over the last few years. She stopped  taking Lasix because it made her back hurt. She takes HCTZ as well for a Meniere's-like disease. She reports compliance with CPAP.   Past Medical History:  Diagnosis Date   Allergic rhinitis    Arthritis    Chronic back pain    Chronic diastolic CHF (congestive heart failure) (HCC)    Diabetes mellitus    type 11    Diverticular disease of colon    Exercise-induced asthma    GERD (gastroesophageal reflux disease)    Graves disease    status post radioactive iodine treatment   Hypertension    mild left vent dysfunction    Hypothyroidism    Morbid obesity ([place])    Obesity    Pulmonary hypertension ([place])    Sleep apnea    cpap-automatic settings   Vein, varicose     Past Surgical History:  Procedure Laterality Date   ABDOMINAL HYSTERECTOMY     APPENDECTOMY     BREAST BIOPSY Left 1994   CHOLECYSTECTOMY     COLONOSCOPY N/A 07/18/2015   Procedure: COLONOSCOPY;  Surgeon: Teena Irani, MD;  Location: [facility];  Service: Endoscopy;  Laterality: N/A;   ESOPHAGOGASTRODUODENOSCOPY N/A 07/18/2015   Procedure: ESOPHAGOGASTRODUODENOSCOPY (EGD);  Surgeon: Teena Irani, MD;  Location: [facility];  Service: Endoscopy;  Laterality: N/A;   EYE SURGERY     FOOT ARTHROPLASTY     rt foot as child   KNEE ARTHROSCOPY  04/20/2011   Procedure: ARTHROSCOPY KNEE;  Surgeon: Hessie Dibble, MD;  Location: [facility];  Service: Orthopedics;  Laterality: Right;  right knee arthroscopy partial medial menisectomy and chondroplasty.   RIGHT HEART CATH N/A 11/08/2017   Procedure: RIGHT HEART CATH;  Surgeon: Belva Crome, MD;  Location: [facility];  Service: Cardiovascular;  Laterality: N/A;   TOTAL KNEE ARTHROPLASTY Right 12/11/2013   Procedure: TOTAL KNEE ARTHROPLASTY;  Surgeon: Hessie Dibble, MD;  Location: [facility];  Service: Orthopedics;  Laterality: Right;   TOTAL KNEE ARTHROPLASTY Left 02/05/2014   Procedure: LEFT TOTAL KNEE ARTHROPLASTY;  Surgeon:  Hessie Dibble, MD;  Location: [facility];  Service: Orthopedics;  Laterality: Left;   UPPER GASTROINTESTINAL ENDOSCOPY     VARICOSE VEIN SURGERY      Current Medications: Current Meds  Medication Sig   acetaminophen (TYLENOL) 650 MG CR tablet Take 650 mg by mouth every 12 (twelve) hours.   albuterol (PROVENTIL HFA;VENTOLIN HFA) 108 (90 BASE) MCG/ACT inhaler Inhale 2 puffs into the lungs every 6 (six) hours as needed for wheezing or shortness of breath.   aspirin 81 MG tablet Take 81 mg by mouth every evening.    atorvastatin (LIPITOR) 10 MG tablet Take 10 mg by mouth every evening.    Biotin w/ Vitamins C & E (HAIR/SKIN/NAILS PO) Take by mouth daily.   cetirizine (ZYRTEC) 10 MG tablet Take 10 mg by mouth every evening.    Cholecalciferol (VITAMIN D-3) 5000 UNITS TABS Take 5,000 Units by mouth every evening.    Chromium-Cinnamon [phone] MCG-MG CAPS Take 1 capsule by mouth 2 (two) times daily.    citalopram (CELEXA) 20 MG tablet Take 20 mg by mouth every evening.    CRANBERRY PO Take by mouth daily.   cyclobenzaprine (FLEXERIL) 5 MG tablet Take 5 mg by mouth 3 (three) times daily as needed for muscle spasms.   diltiazem (CARDIZEM CD) 180 MG 24 hr capsule Take 1 capsule (180 mg total) by mouth daily.   fluticasone (FLONASE) 50 MCG/ACT nasal spray Place 2 sprays into both nostrils every morning.    Fluticasone-Salmeterol (ADVAIR DISKUS) 250-50 MCG/DOSE AEPB Take 1 puff by mouth daily as needed.    gabapentin (NEURONTIN) 600 MG tablet Take 1 tablet by mouth 4 (four) times daily.   hydrochlorothiazide (MICROZIDE) 12.5 MG capsule Take 12.5 mg by mouth every evening.    ketoconazole (NIZORAL) 2 % shampoo Apply 1 application topically as needed for itching.   levothyroxine (SYNTHROID) 175 MCG tablet Take 175 mcg by mouth daily before breakfast.    lisinopril (PRINIVIL,ZESTRIL) 2.5 MG tablet Take 1 tablet (2.5 mg total) by mouth daily.   Magnesium Oxide (MAG-OX PO) Take 500 mg  by mouth daily.   metFORMIN (GLUMETZA) 500 MG (MOD) 24 hr tablet Take 500 mg by mouth every evening.   therapeutic multivitamin-minerals (THERAGRAN-M) tablet Take 1 tablet by mouth every morning.    Turmeric 500 MG CAPS Take 500 mg by mouth daily.   valACYclovir (VALTREX) 500 MG tablet Take 500 mg by mouth every evening.      Allergies:   Septra [sulfamethoxazole w/trimethoprim (co-trimoxazole)], Metoprolol tartrate, Minocycline, Codeine, Erythromycin, Nsaids, Septra [sulfamethoxazole-trimethoprim], and Tetracyclines & related   Social History   Socioeconomic History   Marital status: Single    Spouse name: Not on file   Number of children: Not on file   Years of education: Not on file   Highest education level: Not on file  Occupational History   Not on file  Social Needs   Financial resource strain: Not on file  Food insecurity    Worry: Not on file    Inability: Not on file   Transportation needs    Medical: Not on file    Non-medical: Not on file  Tobacco Use   Smoking status: Former Smoker    Quit date: 04/12/1986    Years since quitting: 32.5   Smokeless tobacco: Never Used  Substance and Sexual Activity   Alcohol use: Yes    Alcohol/week: 0.0 standard drinks    Comment: rarely    Drug use: No   Sexual activity: Not on file  Lifestyle   Physical activity    Days per week: Not on file    Minutes per session: Not on file   Stress: Not on file  Relationships   Social connections    Talks on phone: Not on file    Gets together: Not on file    Attends religious service: Not on file    Active member of club or organization: Not on file    Attends meetings of clubs or organizations: Not on file    Relationship status: Not on file  Other Topics Concern   Not on file  Social History Narrative   Not on file     Family History:  The patient's family history includes Colon cancer in her sister; Diabetes in her father and mother.  ROS:     Please see the history of present illness.  All other systems are reviewed and otherwise negative.    PHYSICAL EXAM:   VS:  BP 108/72    Pulse 72    Ht 5\' 11"  (1.803 m)    Wt (!) 342 lb 6.4 oz (155.3 kg)    SpO2 96%    BMI 47.76 kg/m   BMI: Body mass index is 47.76 kg/m. GEN: Well nourished, well developed morbidly obese AAF in no acute distress HEENT: normocephalic, atraumatic Neck: no JVD, carotid bruits, or masses Cardiac: RRR; no murmurs, rubs, or gallops, 2+ dependent edema with lymphedema type appearance, stiff Respiratory:  clear to auscultation bilaterally, normal work of breathing GI: soft, nontender, nondistended, + BS MS: no deformity or atrophy Skin: warm and dry, no rash Neuro:  Alert and Oriented x 3, Strength and sensation are intact, follows commands Psych: euthymic mood, full affect  Wt Readings from Last 3 Encounters:  10/17/18 (!) 342 lb 6.4 oz (155.3 kg)  11/23/17 (!) 332 lb 6.4 oz (150.8 kg)  11/08/17 (!) 330 lb (149.7 kg)      Studies/Labs Reviewed:   EKG:  EKG was ordered today and personally reviewed by me and demonstrates NSR 85bpm, nonspecific TW changes similar to prior  Recent Labs: 10/25/2017: BUN 11; Creatinine, Ser 0.88; Hemoglobin 13.2; Platelets 243; Potassium 4.8; Sodium 140   Additional studies/ records that were reviewed today include: Summarized above   ASSESSMENT & PLAN:   1. Pre-op cardiovascular clearance - she has h/o normal coronaries and normal LVEF. She is able to exert herself beyond 4 METS without angina or unusual dyspnea. Therefore, based on ACC/AHA guidelines, Melanie Hood would be at acceptable risk for the planned procedure without further cardiovascular testing. OK to hold ASA if needed for procedure. Will route this note to surgical team for clearance. 2. Chronic diastolic CHF - she has chronic edema that appears to be in part due to her morbid obesity. She is not interested in higher dose diuretics. We do not traditionally  recommend a combination of HCTZ and Lasix but the patient specifically takes  HCTZ for an inner ear issue. We discussed increasing HCTZ to 25mg  (and decreasing diltiazem to 120 so as not to drop BP), vs re-introduction of furosemide. She said she is willing to try Lasix 20mg  daily as needed and take as tolerated. Reviewed 2g sodium restriction, 2L fluid restriction, daily weights with patient. Ultimately weight loss will be of utmost importance for her. 3. Pulmonary HTN - likely due to combination of diastolic CHF, morbid obesity, and OSA. Continue to monitor clinically. She is asymptomatic currently and actively working towards a goal of weight loss. Would consider repeat echo at 6 month f/u to trend. 4. Essential HTN - controlled. She will continue to monitor at home periodically.  Disposition: F/u with Dr. Meda Coffee in 6 months.  Medication Adjustments/Labs and Tests Ordered: Current medicines are reviewed at length with the patient today.  Concerns regarding medicines are outlined above. Medication changes, Labs and Tests ordered today are summarized above and listed in the Patient Instructions accessible in Encounters.   Signed, Charlie Pitter, PA-C  10/17/2018 10:28 AM    [facility] [address], [place], [place]  [address] Phone: [phone]; Fax: [phone]

## 2018-10-16 NOTE — Telephone Encounter (Signed)
Thanks Melanie Hood! I will take care of clearance at time of visit.

## 2018-10-16 NOTE — Telephone Encounter (Signed)
Patient is scheduled to see [name] tomorrow for cardiac clearance.

## 2018-10-17 ENCOUNTER — Encounter: Payer: Self-pay | Admitting: Physician Assistant

## 2018-10-17 ENCOUNTER — Ambulatory Visit (INDEPENDENT_AMBULATORY_CARE_PROVIDER_SITE_OTHER): Payer: 59 | Admitting: Physician Assistant

## 2018-10-17 ENCOUNTER — Other Ambulatory Visit: Payer: Self-pay

## 2018-10-17 VITALS — BP 108/72 | HR 72 | Ht 71.0 in | Wt 342.4 lb

## 2018-10-17 DIAGNOSIS — I272 Pulmonary hypertension, unspecified: Secondary | ICD-10-CM

## 2018-10-17 DIAGNOSIS — Z01810 Encounter for preprocedural cardiovascular examination: Secondary | ICD-10-CM | POA: Diagnosis not present

## 2018-10-17 DIAGNOSIS — I5032 Chronic diastolic (congestive) heart failure: Secondary | ICD-10-CM | POA: Diagnosis not present

## 2018-10-17 DIAGNOSIS — I10 Essential (primary) hypertension: Secondary | ICD-10-CM

## 2018-10-17 MED ORDER — FUROSEMIDE 20 MG PO TABS: 20.0000 mg | ORAL_TABLET | Freq: Every day | ORAL | 3 refills | Status: DC | PRN

## 2018-10-17 NOTE — Patient Instructions (Signed)
Medication Instructions:  Your physician has recommended you make the following change in your medication:  1.  START Lasix 20 mg taking 1 tablet daily only as needed for swelling  If you need a refill on your cardiac medications before your next appointment, please call your pharmacy.   Lab work: None ordered  If you have labs (blood work) drawn today and your tests are completely normal, you will receive your results only by: Marland Kitchen MyChart Message (if you have MyChart) OR . A paper copy in the mail If you have any lab test that is abnormal or we need to change your treatment, we will call you to review the results.  Testing/Procedures: None ordered  Follow-Up: At [facility], you and your health needs are our priority.  As part of our continuing mission to provide you with exceptional heart care, we have created designated Provider Care Teams.  These Care Teams include your primary Cardiologist (physician) and Advanced Practice Providers (APPs -  Physician Assistants and Nurse Practitioners) who all work together to provide you with the care you need, when you need it. You will need a follow up appointment in 6 months.  Please call our office 2 months in advance to schedule this appointment.  You may see Ena Dawley, MD or one of the following Advanced Practice Providers on your designated Care Team:   [place], PA-C [name], PA-C . Ermalinda Barrios, PA-C  Any Other Special Instructions Will Be Listed Below (If Applicable).

## 2018-10-17 NOTE — Telephone Encounter (Signed)
OV note faxed to [facility] 10/17/18. Pt cleared. Maizey Menendez PA-C

## 2018-10-25 ENCOUNTER — Telehealth: Payer: Self-pay | Admitting: Physician Assistant

## 2018-10-25 ENCOUNTER — Telehealth: Payer: Self-pay | Admitting: Cardiology

## 2018-10-25 NOTE — Telephone Encounter (Signed)
   Primary Cardiologist: Ena Dawley, MD  Chart reviewed as part of pre-operative protocol coverage. Given past medical history and time since last visit, based on ACC/AHA guidelines, Melanie Hood would be at acceptable risk for the planned procedure without further cardiovascular testing.  Pt was evaluated in office please see note for details.  I will route this recommendation to the requesting party via Epic fax function and remove from pre-op pool.  Please call with questions.  Cecilie Kicks, NP 10/25/2018, 11:20 AM

## 2018-10-25 NOTE — Telephone Encounter (Signed)
Follow Up:    Wells Guiles from [facility] office, calling to check on the status of pt's clearance. She know pt was supposed to have office visit, but have not received the clearance. Please fax to [phone].

## 2018-11-14 ENCOUNTER — Ambulatory Visit: Payer: 59

## 2018-12-26 ENCOUNTER — Other Ambulatory Visit: Payer: Self-pay

## 2018-12-26 ENCOUNTER — Ambulatory Visit
Admission: RE | Admit: 2018-12-26 | Discharge: 2018-12-26 | Disposition: A | Discharge status: Home / Self Care | Payer: 59 | Source: Ambulatory Visit | Attending: Family Medicine | Admitting: Family Medicine

## 2018-12-26 DIAGNOSIS — Z1231 Encounter for screening mammogram for malignant neoplasm of breast: Secondary | ICD-10-CM

## 2019-01-15 ENCOUNTER — Telehealth: Payer: Self-pay | Admitting: Cardiology

## 2019-01-15 NOTE — Telephone Encounter (Signed)
New message    Patient states that she is having an issue with   lisinopril (PRINIVIL,ZESTRIL) 2.5 MG tablet    She states that her b/p spikes at night. Please call to discuss.

## 2019-01-15 NOTE — Telephone Encounter (Signed)
Pt is calling to let Dr. Meda Coffee know that she is doing very well, she has started exercising daily, and her weight his down 30 lbs, and she is feeling great.  Pt states that the only issue is her BP is running normal in the daytime at around 120/70s HR-70-80s, and during the nighttime it goes up to 140/80s HR-still 70-80s.  Pt states she mostly takes her med regimen at night, but would like to see her BP in the evening times go down some more.  Pt would like for Dr. Meda Coffee to advise if it would be ok to increase her lisinopril from 2.5 mg po daily, to lisinopril 2.5 mg po bid, take one in the am and one in the pm.  Pt thinks if she takes lisinopril 2.5 mg po bid, this will even her BPs all together. Pt states otherwise, she is doing fantastic.  Informed the pt that Dr. Meda Coffee is out of the office today, but will return tomorrow, and I will route this request to her to further review and advise, and follow-up with her shortly thereafter.  Pt states if Dr. Meda Coffee agrees to the increase, she will continue to monitor her HR/BP and will monitor for any symptoms like dizziness, and call back as needed, if symptoms  occurs.  Pt verbalized understanding and agrees with this plan.

## 2019-01-16 MED ORDER — LISINOPRIL 5 MG PO TABS: 5.0000 mg | ORAL_TABLET | Freq: Every day | ORAL | 1 refills | Status: AC

## 2019-01-16 NOTE — Telephone Encounter (Signed)
It is okay but I would increase to 5 mg daily instead.

## 2019-01-16 NOTE — Telephone Encounter (Signed)
Notified the pt that per Dr Meda Coffee, she recommends that she increase her lisinopril to 5 mg po daily instead, continue to monitor, and report any concerning numbers.  Confirmed the pharmacy of choice with the pt. Pt verbalized understanding and agrees with this plan.

## 2019-01-18 ENCOUNTER — Other Ambulatory Visit: Payer: Self-pay | Admitting: Physician Assistant

## 2019-05-24 ENCOUNTER — Other Ambulatory Visit: Payer: Self-pay | Admitting: Physician Assistant

## 2019-05-30 ENCOUNTER — Telehealth (INDEPENDENT_AMBULATORY_CARE_PROVIDER_SITE_OTHER): Payer: 59 | Admitting: Cardiology

## 2019-05-30 ENCOUNTER — Other Ambulatory Visit: Payer: Self-pay

## 2019-05-30 ENCOUNTER — Encounter: Payer: Self-pay | Admitting: Cardiology

## 2019-05-30 ENCOUNTER — Encounter: Payer: Self-pay | Admitting: *Deleted

## 2019-05-30 VITALS — BP 128/72 | HR 78 | Wt 324.0 lb

## 2019-05-30 DIAGNOSIS — I272 Pulmonary hypertension, unspecified: Secondary | ICD-10-CM

## 2019-05-30 DIAGNOSIS — I110 Hypertensive heart disease with heart failure: Secondary | ICD-10-CM | POA: Diagnosis not present

## 2019-05-30 DIAGNOSIS — E785 Hyperlipidemia, unspecified: Secondary | ICD-10-CM

## 2019-05-30 DIAGNOSIS — E782 Mixed hyperlipidemia: Secondary | ICD-10-CM

## 2019-05-30 DIAGNOSIS — I5032 Chronic diastolic (congestive) heart failure: Secondary | ICD-10-CM

## 2019-05-30 DIAGNOSIS — Z6841 Body Mass Index (BMI) 40.0 and over, adult: Secondary | ICD-10-CM

## 2019-05-30 DIAGNOSIS — E669 Obesity, unspecified: Secondary | ICD-10-CM

## 2019-05-30 NOTE — Patient Instructions (Signed)
Medication Instructions:   Your physician recommends that you continue on your current medications as directed. Please refer to the Current Medication list given to you today.  If you need a refill on your cardiac medications before your next appointment, please call your pharmacy.     Follow-Up: At [facility], you and your health needs are our priority.  As part of our continuing mission to provide you with exceptional heart care, we have created designated Provider Care Teams.  These Care Teams include your primary Cardiologist (physician) and Advanced Practice Providers (APPs -  Physician Assistants and Nurse Practitioners) who all work together to provide you with the care you need, when you need it.  Your physician wants you to follow-up in: 6 MONTHS WITH DR NELSON IN PERSON You will receive a reminder letter in the mail two months in advance. If you don't receive a letter, please call our office to schedule the follow-up appointment.    

## 2019-05-30 NOTE — Progress Notes (Signed)
Virtual Visit via Video Note   This visit type was conducted due to national recommendations for restrictions regarding the COVID-19 Pandemic (e.g. social distancing) in an effort to limit this patient's exposure and mitigate transmission in our community.  Due to her co-morbid illnesses, this patient is at least at moderate risk for complications without adequate follow up.  This format is felt to be most appropriate for this patient at this time.  All issues noted in this document were discussed and addressed.  A limited physical exam was performed with this format.  Please refer to the patient's chart for her consent to telehealth for [facility].   Date:  05/30/2019   ID:  Melanie Hood, DOB 1956-02-06, MRN [id number]  Patient Location: Home Provider Location: Home  PCP:  Cari Caraway, MD  Cardiologist:  Ena Dawley, MD  Electrophysiologist:  None   Evaluation Performed:  Follow-Up Visit  Reason for visit:  1 year follow up  History of Present Illness:    Melanie Hood is a 64 y.o. female with history of chronic diastolic CHF, pulmonary HTN, morbid obesity, DM, HTN, sleep apnea on CPAP, HLD (not followed by our office), prior situational depression, exercise induced asthma who presents for f/u and medical clearance for surgery.  In 10/2017 she had been seen for DOE, LE edema, and tachycardia with ambulation.D-dimer was elevated at 1.64. LE venous duplex was negative for DVT. CT angio 09/2017 showed no PE or acute process, enlarged main pulmonary artery. 2D echo 10/2017 showed normal EF at 60-65%, G1DD, normal wall motion - unfortunately, pulmonary artery systolic BP could not be accurately estimated. Coronary CTA was normal. She underwent right heart cath 11/08/17 showing mild pulmonary HTN, mildly elevated pulmonary capillary wedge pressure mean of 22 mmHg, no L-R shunting - findings suggested left ventricular diastolic dysfunction as the cause of the patient's pulmonary  hypertension. F/u labs 10/2017 showed K 4.8, Cr 0.88, glucose 113, normal CBC, 09/2017 TSH wnl. She had labs by PCP on her portal in 08/2018 showing K 4.2, Cr 0.98.  She was last seen in July 2021 for preop evaluation prior to thumb surgery.   05/30/2019 - the patient has been doing well, she works from home. She has lost a close friend to breast cancer. As a result she stopped exercising and gained weight. She denies any chest pain, SOB, she has occasional LE edema, no orthopnea, PND. She is using CPAP every night.    The patient does not have symptoms concerning for COVID-19 infection (fever, chills, cough, or new shortness of breath).   Past Medical History:  Diagnosis Date   Allergic rhinitis    Arthritis    Chronic back pain    Chronic diastolic CHF (congestive heart failure) (HCC)    Diabetes mellitus    type 11    Diverticular disease of colon    Exercise-induced asthma    GERD (gastroesophageal reflux disease)    Graves disease    status post radioactive iodine treatment   Hypertension    mild left vent dysfunction    Hypothyroidism    Morbid obesity (HCC)    Obesity    Pulmonary hypertension (HCC)    Sleep apnea    cpap-automatic settings   Vein, varicose    Past Surgical History:  Procedure Laterality Date   ABDOMINAL HYSTERECTOMY     APPENDECTOMY     BREAST BIOPSY Left 1994   CHOLECYSTECTOMY     COLONOSCOPY N/A 07/18/2015   Procedure:  COLONOSCOPY;  Surgeon: Teena Irani, MD;  Location: [facility];  Service: Endoscopy;  Laterality: N/A;   ESOPHAGOGASTRODUODENOSCOPY N/A 07/18/2015   Procedure: ESOPHAGOGASTRODUODENOSCOPY (EGD);  Surgeon: Teena Irani, MD;  Location: [facility];  Service: Endoscopy;  Laterality: N/A;   EYE SURGERY     FOOT ARTHROPLASTY     rt foot as child   KNEE ARTHROSCOPY  04/20/2011   Procedure: ARTHROSCOPY KNEE;  Surgeon: Hessie Dibble, MD;  Location: [place];  Service: Orthopedics;  Laterality: Right;   right knee arthroscopy partial medial menisectomy and chondroplasty.   RIGHT HEART CATH N/A 11/08/2017   Procedure: RIGHT HEART CATH;  Surgeon: Belva Crome, MD;  Location: [facility];  Service: Cardiovascular;  Laterality: N/A;   TOTAL KNEE ARTHROPLASTY Right 12/11/2013   Procedure: TOTAL KNEE ARTHROPLASTY;  Surgeon: Hessie Dibble, MD;  Location: [place];  Service: Orthopedics;  Laterality: Right;   TOTAL KNEE ARTHROPLASTY Left 02/05/2014   Procedure: LEFT TOTAL KNEE ARTHROPLASTY;  Surgeon: Hessie Dibble, MD;  Location: [place];  Service: Orthopedics;  Laterality: Left;   UPPER GASTROINTESTINAL ENDOSCOPY     VARICOSE VEIN SURGERY      Current Meds  Medication Sig   acetaminophen (TYLENOL) 650 MG CR tablet Take 650 mg by mouth every 12 (twelve) hours.   albuterol (PROVENTIL HFA;VENTOLIN HFA) 108 (90 BASE) MCG/ACT inhaler Inhale 2 puffs into the lungs every 6 (six) hours as needed for wheezing or shortness of breath.   aspirin 81 MG tablet Take 81 mg by mouth every evening.    atorvastatin (LIPITOR) 20 MG tablet Take 20 mg by mouth daily.   Biotin w/ Vitamins C & E (HAIR/SKIN/NAILS PO) Take by mouth daily.   cetirizine (ZYRTEC) 10 MG tablet Take 10 mg by mouth every evening.    Cholecalciferol (VITAMIN D-3) 5000 UNITS TABS Take 5,000 Units by mouth every evening.    Chromium-Cinnamon [phone] MCG-MG CAPS Take 1 capsule by mouth 2 (two) times daily.    citalopram (CELEXA) 20 MG tablet Take 20 mg by mouth every evening.    CRANBERRY PO Take by mouth daily.   diltiazem (CARDIZEM CD) 180 MG 24 hr capsule Take 1 capsule (180 mg total) by mouth daily.   fluticasone (FLONASE) 50 MCG/ACT nasal spray Place 2 sprays into both nostrils every morning.    Fluticasone-Salmeterol (ADVAIR DISKUS) 250-50 MCG/DOSE AEPB Take 1 puff by mouth daily as needed.    furosemide (LASIX) 20 MG tablet TAKE ONE TABLET BY MOUTH DAILY AS NEEDED FOR EDEMA   gabapentin (NEURONTIN) 600 MG tablet  Take 1 tablet by mouth 4 (four) times daily.   hydrochlorothiazide (MICROZIDE) 12.5 MG capsule Take 12.5 mg by mouth every evening.    ketoconazole (NIZORAL) 2 % shampoo Apply 1 application topically as needed for itching.   levothyroxine (SYNTHROID) 175 MCG tablet Take 175 mcg by mouth daily before breakfast.    lisinopril (ZESTRIL) 5 MG tablet Take 1 tablet (5 mg total) by mouth daily.   Magnesium Oxide (MAG-OX PO) Take 500 mg by mouth daily.   metFORMIN (GLUMETZA) 500 MG (MOD) 24 hr tablet Take 500 mg by mouth every evening.   therapeutic multivitamin-minerals (THERAGRAN-M) tablet Take 1 tablet by mouth every morning.    tiZANidine (ZANAFLEX) 4 MG tablet Take 4 mg by mouth every 6 (six) hours as needed for muscle spasms.   Turmeric 500 MG CAPS Take 500 mg by mouth daily.   valACYclovir (VALTREX) 500 MG tablet Take  500 mg by mouth every evening.     Allergies:   Septra [sulfamethoxazole w/trimethoprim (co-trimoxazole)], Metoprolol tartrate, Minocycline, Codeine, Erythromycin, Nsaids, Septra [sulfamethoxazole-trimethoprim], and Tetracyclines & related   Social History   Tobacco Use   Smoking status: Former Smoker    Quit date: 04/12/1986    Years since quitting: 33.1   Smokeless tobacco: Never Used  Substance Use Topics   Alcohol use: Yes    Alcohol/week: 0.0 standard drinks    Comment: rarely    Drug use: No    Family Hx: The patient's family history includes Colon cancer in her sister; Diabetes in her father and mother.  ROS:   Please see the history of present illness.    All other systems reviewed and are negative.   Prior CV studies:   The following studies were reviewed today:  Labs/Other Tests and Data Reviewed:    EKG:  No ECG reviewed.  Recent Labs: No results found for requested labs within last 8760 hours.   Recent Lipid Panel No results found for: CHOL, TRIG, HDL, CHOLHDL, LDLCALC, LDLDIRECT  Wt Readings from Last 3 Encounters:  05/30/19 (!)  324 lb (147 kg)  10/17/18 (!) 342 lb 6.4 oz (155.3 kg)  11/23/17 (!) 332 lb 6.4 oz (150.8 kg)     Objective:    Vital Signs:  BP 128/72    Pulse 78    Wt (!) 324 lb (147 kg)    BMI 45.19 kg/m    VITAL SIGNS:  reviewed  ASSESSMENT & PLAN:    1. Chronic diastolic CHF - minimal on and off LE edema, advised that she can take 40 mg po daily PRN.  2. Pulmonary HTN - likely due to combination of diastolic CHF, obesity, and OSA. Continue to monitor clinically. She is asymptomatic currently and actively working towards a goal of weight loss. Would consider repeat echo at 6 month f/u to trend. 3. Essential HTN - controlled. She will continue to monitor at home periodically. 4. Obesity - the patient is advised to exercise for 30 minutes/5days/week. She is motivated and has a lot of gym equipment at home. 5. Hyperlipidemia - tolerating atorvastatin well, most recent lipids LDL 108, TG 65, ALT 9, AST 13.   COVID-19 Education: The signs and symptoms of COVID-19 were discussed with the patient and how to seek care for testing (follow up with PCP or arrange E-visit).  The importance of social distancing was discussed today.  Time:   Today, I have spent 25 minutes with the patient with telehealth technology discussing the above problems.    Medication Adjustments/Labs and Tests Ordered: Current medicines are reviewed at length with the patient today.  Concerns regarding medicines are outlined above.   Tests Ordered: No orders of the defined types were placed in this encounter.  Medication Changes: No orders of the defined types were placed in this encounter.  Follow Up:  In Person in 6 month(s)  Signed, Ena Dawley, MD  05/30/2019 10:35 AM    [place]

## 2019-08-06 IMAGING — DX DG CHEST 2V
2 series · 2 of 2 positions shown · non-contrast
Comparison: Chest x-ray dated June 15, 2016.

CLINICAL DATA: Worsening shortness of breath with exertion.

EXAM:
CHEST - 2 VIEW

[chest pa]
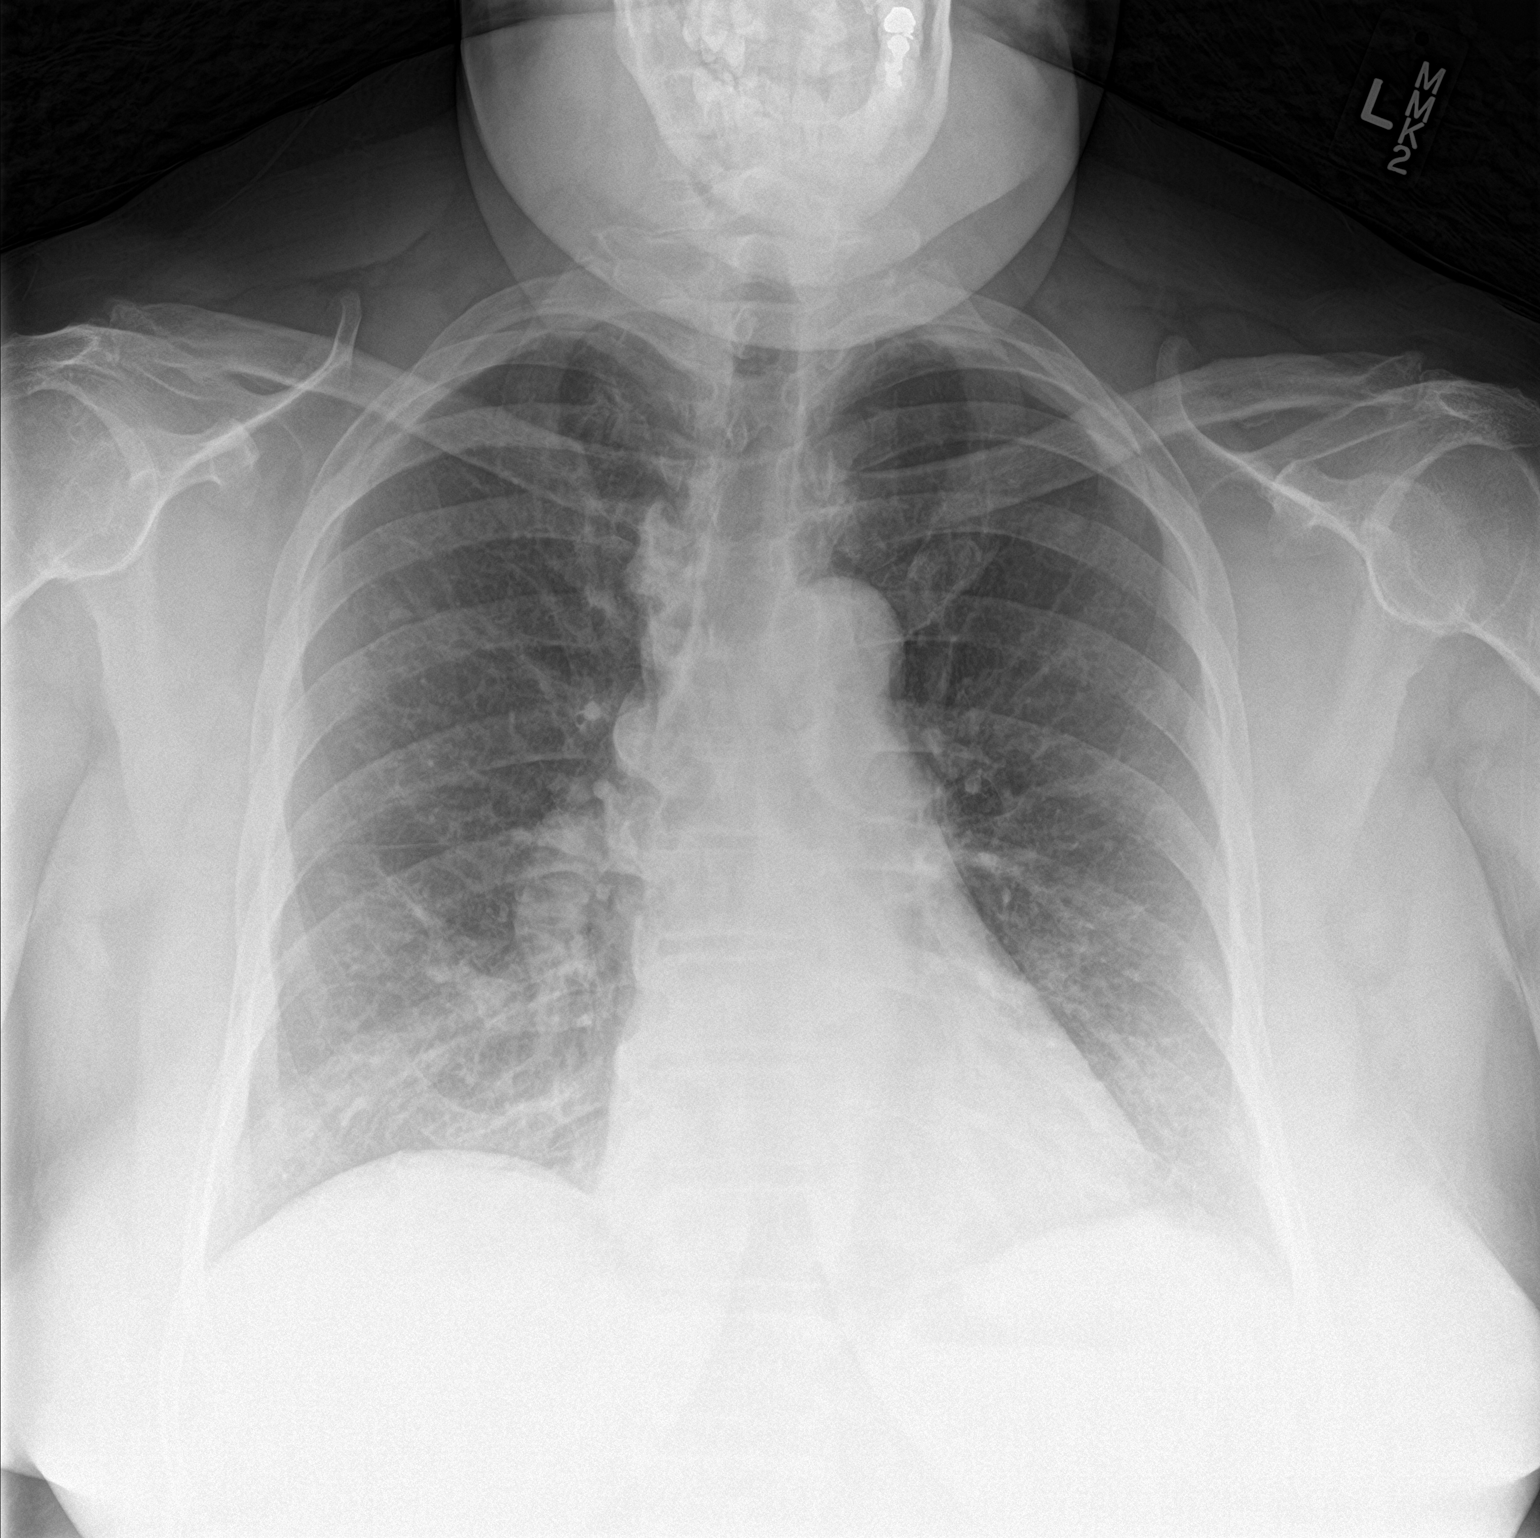

[chest lat]
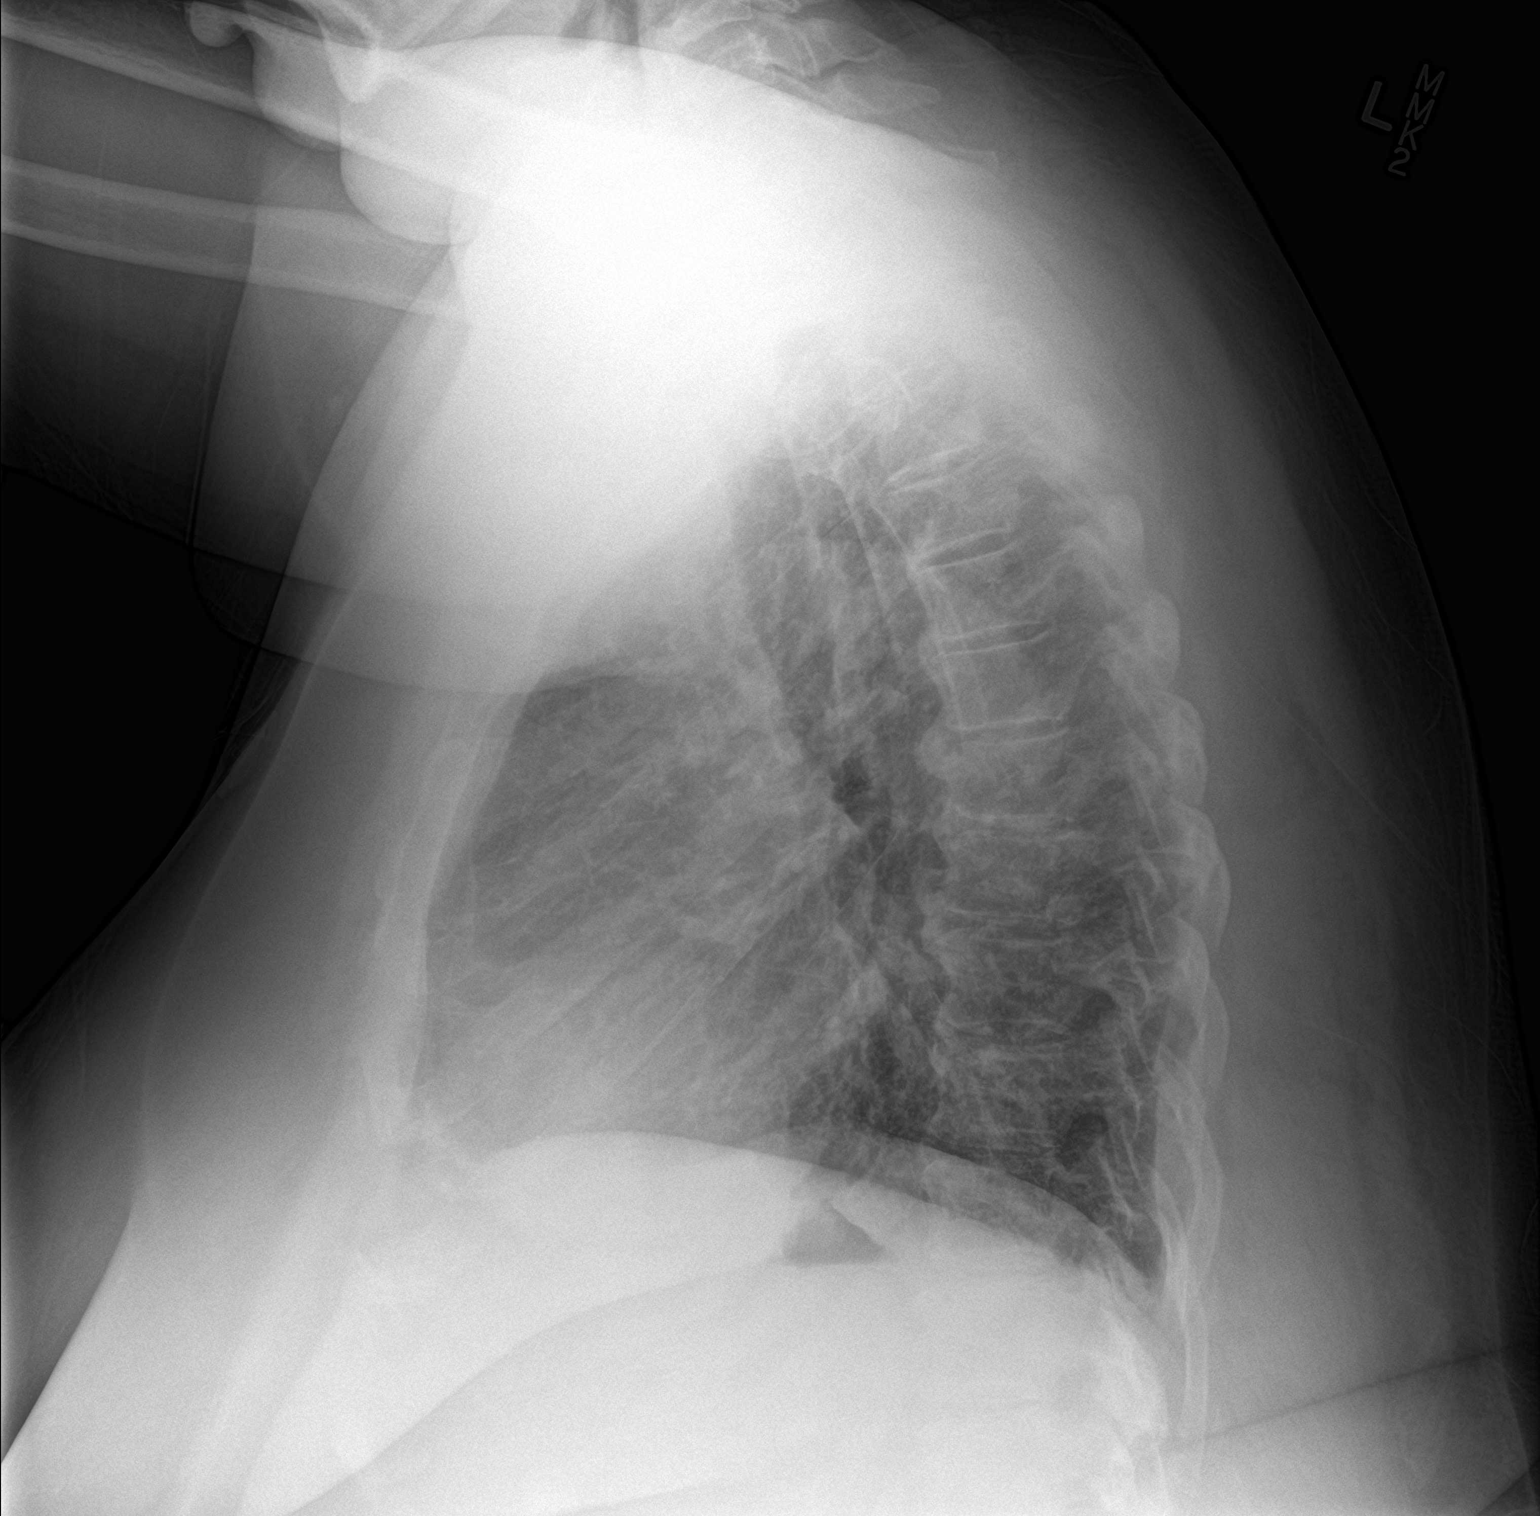

[2 of 2 positions shown; findings below may reference images not displayed]

FINDINGS: The heart remains at the upper limits of normal in size. Normal
pulmonary vascularity. Mildly coarsened interstitial markings are
similar to prior study. Mild bibasilar atelectasis. No focal
consolidation, pleural effusion, or pneumothorax. No acute osseous
abnormality.
IMPRESSION: Chronic bronchitic changes and mild bibasilar atelectasis. No active
cardiopulmonary disease.

## 2019-08-06 IMAGING — CT CT ANGIO CHEST
2 of 7 series · 19 of 46 positions shown · IV contrast (APPLIED)
Comparison: Prior chest CT 01/14/2014

CLINICAL DATA: 61-year-old female with shortness of breath

EXAM:
CT ANGIOGRAPHY CHEST WITH CONTRAST
TECHNIQUE: Multidetector CT imaging of the chest was performed using the
standard protocol during bolus administration of intravenous
contrast. Multiplanar CT image reconstructions and MIPs were
obtained to evaluate the vascular anatomy.
CONTRAST:  100mL 62U2MV-B2I IOPAMIDOL (62U2MV-B2I) INJECTION 76%

[Series 7: thins · axial · 0.66mm/px · z∈[+1334,+1618]mm · 16 of 457 slices shown]
[im 26/457  lung]
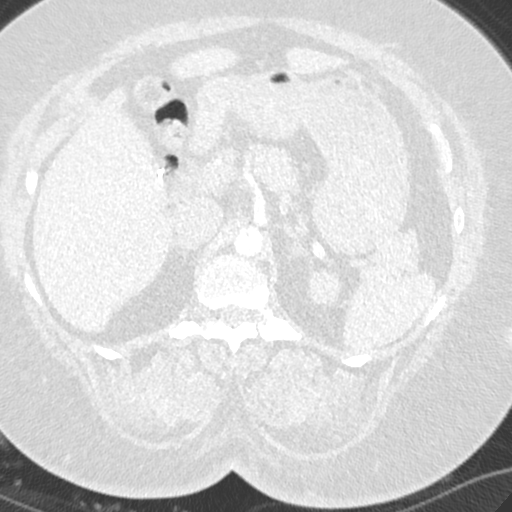
[im 51/457  soft-tissue]
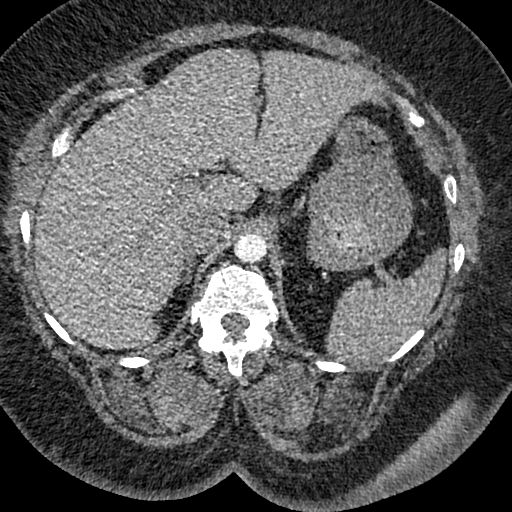
[im 77/457  lung]
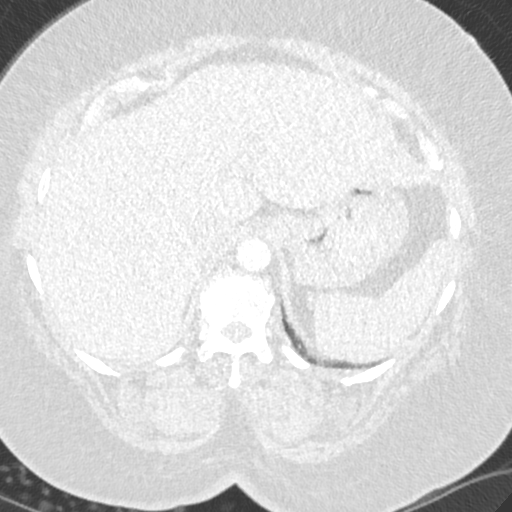
[im 102/457  soft-tissue]
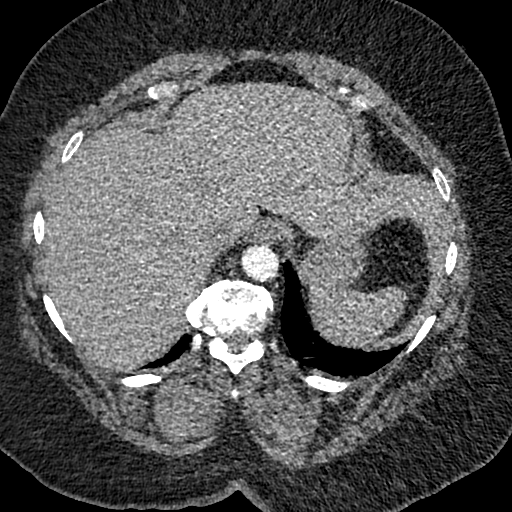
[im 127/457  lung]
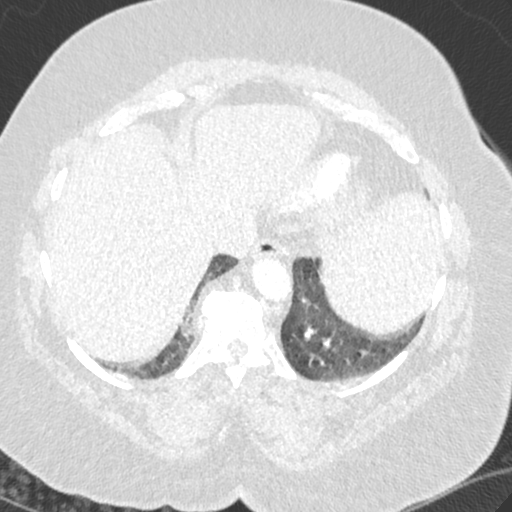
[im 153/457  soft-tissue]
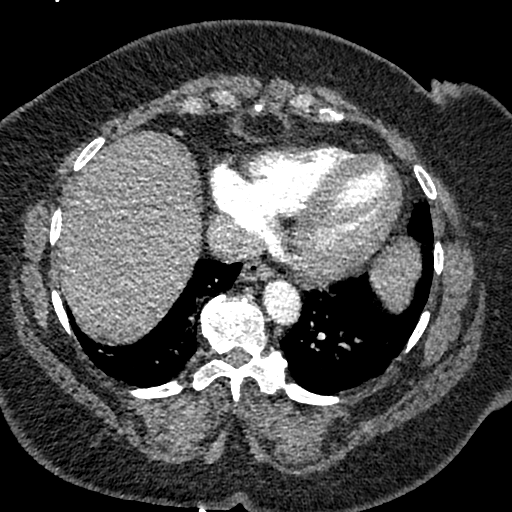
[im 178/457  lung]
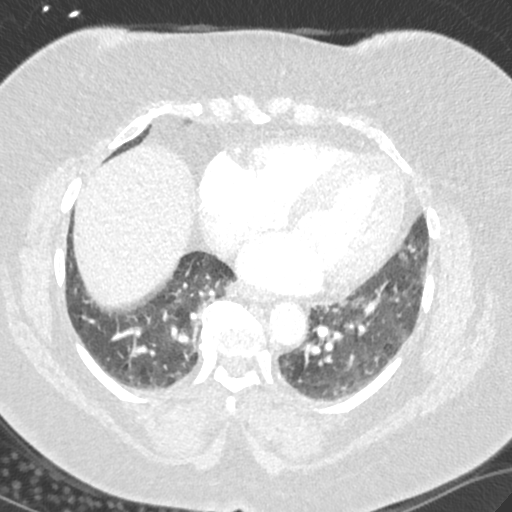
[im 203/457  soft-tissue]
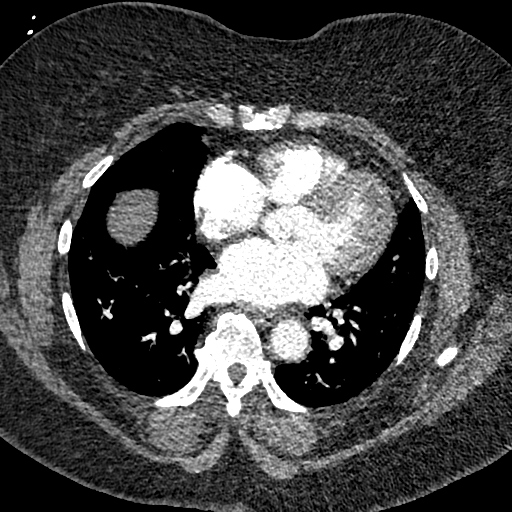
[im 254/457  lung]
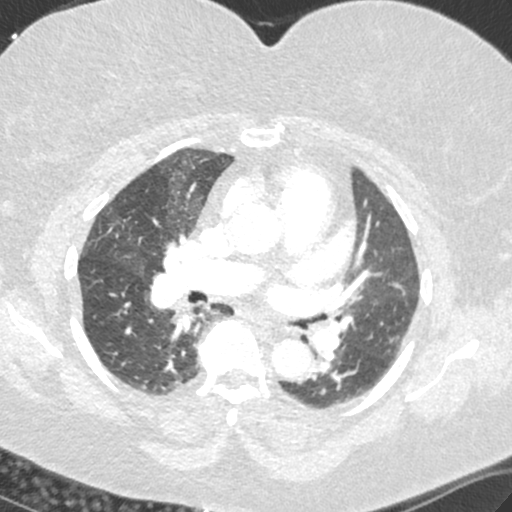
[im 279/457  soft-tissue]
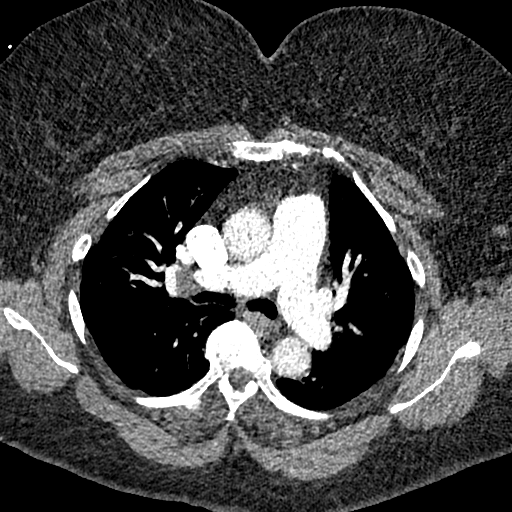
[im 305/457  lung]
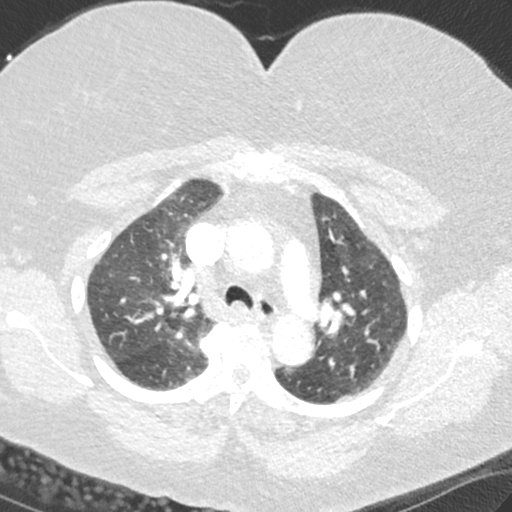
[im 330/457  soft-tissue]
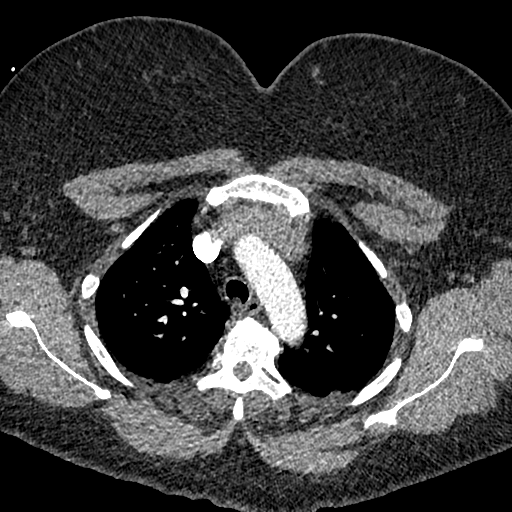
[im 355/457  lung]
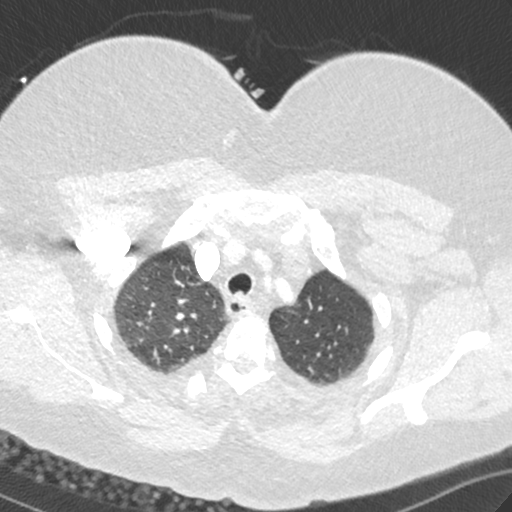
[im 381/457  soft-tissue]
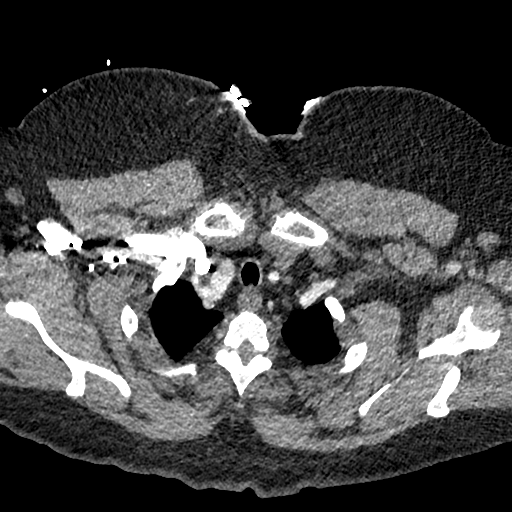
[im 406/457  lung]
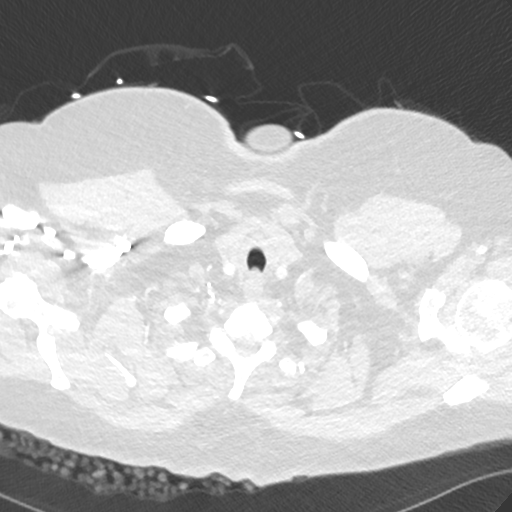
[im 431/457  soft-tissue]
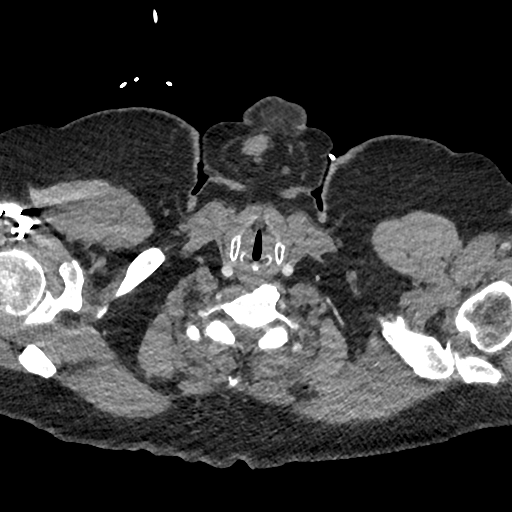

[Series 8: cor · coronal · 0.62mm/px · 3 of 123 slices shown]
[im 31/123  soft-tissue]
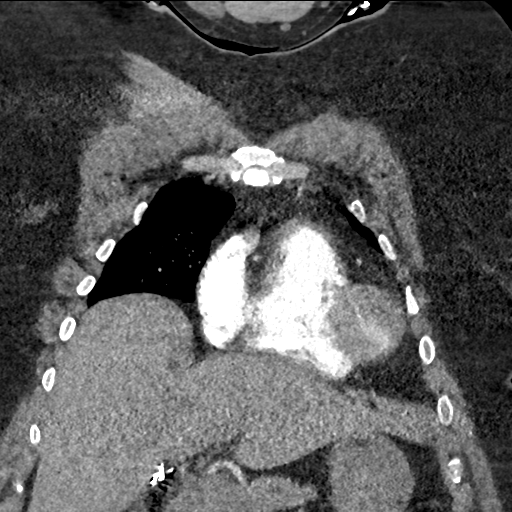
[im 62/123  soft-tissue]
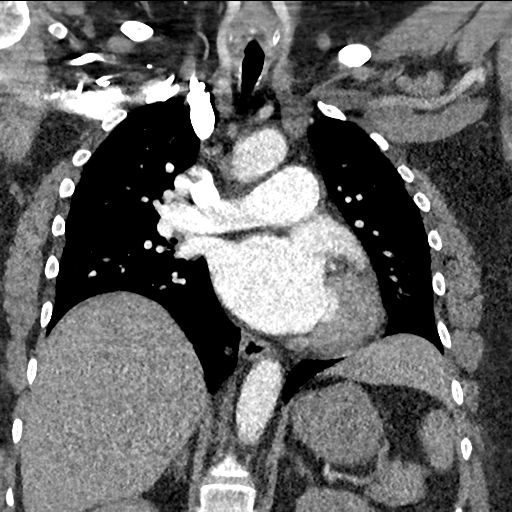
[im 92/123  soft-tissue]
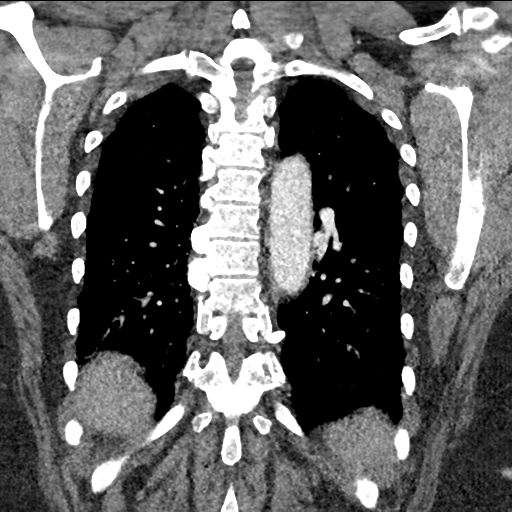

[19 of 46 positions shown; findings below may reference images not displayed]

FINDINGS: Cardiovascular: Adequate opacification of the pulmonary arteries to
the proximal segmental level. Enlarged main pulmonary artery at
cm in diameter. No evidence of central filling defect to suggest
acute pulmonary embolus. The heart is normal in size. The aorta is
normal in size. No evidence of aneurysm. The right brachiocephalic
and left common carotid artery share a common origin. The left
vertebral artery appears to arise directly from the aorta. No
pericardial effusion.

Mediastinum/Nodes: Unremarkable CT appearance of the thyroid gland.
No suspicious mediastinal or hilar adenopathy. No soft tissue
mediastinal mass. The thoracic esophagus is unremarkable.

Lungs/Pleura: Subsegmental atelectasis in the lower lungs. No focal
airspace consolidation, pleural effusion or pneumothorax.

Upper Abdomen: Visualized upper abdominal organs are unremarkable.

Musculoskeletal: No acute fracture or aggressive appearing lytic or
blastic osseous lesion.

Review of the MIP images confirms the above findings.
IMPRESSION: 1. Negative for acute pulmonary embolus, pneumonia or other acute
cardiopulmonary process.
2. Mild dependent atelectasis bilaterally.
3. Enlarged main pulmonary artery at 3.4 cm in diameter. Similar
findings can be seen in the setting of pulmonary arterial
hypertension.

## 2019-12-28 ENCOUNTER — Other Ambulatory Visit: Payer: Self-pay | Admitting: Family Medicine

## 2019-12-28 DIAGNOSIS — Z1231 Encounter for screening mammogram for malignant neoplasm of breast: Secondary | ICD-10-CM

## 2020-01-24 ENCOUNTER — Other Ambulatory Visit: Payer: Self-pay

## 2020-01-24 ENCOUNTER — Ambulatory Visit
Admission: RE | Admit: 2020-01-24 | Discharge: 2020-01-24 | Disposition: A | Discharge status: Home / Self Care | Payer: 59 | Source: Ambulatory Visit | Attending: Family Medicine | Admitting: Family Medicine

## 2020-01-24 DIAGNOSIS — Z1231 Encounter for screening mammogram for malignant neoplasm of breast: Secondary | ICD-10-CM

## 2020-02-21 ENCOUNTER — Ambulatory Visit
Admission: RE | Admit: 2020-02-21 | Discharge: 2020-02-21 | Disposition: A | Discharge status: Home / Self Care | Payer: 59 | Source: Ambulatory Visit | Attending: Family Medicine | Admitting: Family Medicine

## 2020-02-21 ENCOUNTER — Other Ambulatory Visit: Payer: Self-pay

## 2020-03-26 ENCOUNTER — Ambulatory Visit: Payer: 59 | Admitting: Cardiology

## 2020-06-04 ENCOUNTER — Encounter: Payer: Self-pay | Admitting: Dermatology

## 2020-06-04 ENCOUNTER — Ambulatory Visit (INDEPENDENT_AMBULATORY_CARE_PROVIDER_SITE_OTHER): Payer: 59 | Admitting: Dermatology

## 2020-06-04 ENCOUNTER — Other Ambulatory Visit: Payer: Self-pay

## 2020-06-04 DIAGNOSIS — L603 Nail dystrophy: Secondary | ICD-10-CM

## 2020-06-04 DIAGNOSIS — L609 Nail disorder, unspecified: Secondary | ICD-10-CM | POA: Diagnosis not present

## 2020-06-04 DIAGNOSIS — R21 Rash and other nonspecific skin eruption: Secondary | ICD-10-CM | POA: Diagnosis not present

## 2020-06-04 DIAGNOSIS — Z872 Personal history of diseases of the skin and subcutaneous tissue: Secondary | ICD-10-CM | POA: Diagnosis not present

## 2020-06-04 NOTE — Patient Instructions (Signed)
Biopsy Wound Care Instructions  1. Leave the original bandage on for 24 hours if possible.  If the bandage becomes soaked or soiled before that time, it is OK to remove it and examine the wound.  A small amount of post-operative bleeding is normal.  If excessive bleeding occurs, remove the bandage, place gauze over the site and apply continuous pressure (no peeking) over the area for 30 minutes. If this does not work, please call our clinic as soon as possible or page your doctor if it is after hours.   2. Once a day, cleanse the wound with soap and water. It is fine to shower. If a thick crust develops you may use a Q-tip dipped into dilute hydrogen peroxide (mix 1:1 with water) to dissolve it.  Hydrogen peroxide can slow the healing process, so use it only as needed.    3. After washing, apply petroleum jelly (Vaseline) or an antibiotic ointment if your doctor prescribed one for you, followed by a bandage.    4. For best healing, the wound should be covered with a layer of ointment at all times. If you are not able to keep the area covered with a bandage to hold the ointment in place, this may mean re-applying the ointment several times a day.  Continue this wound care until the wound has healed and is no longer open.   Itching and mild discomfort is normal during the healing process. However, if you develop pain or severe itching, please call our office.   If you have any discomfort, you can take Tylenol (acetaminophen) or ibuprofen as directed on the bottle. (Please do not take these if you have an allergy to them or cannot take them for another reason).  Some redness, tenderness and white or yellow material in the wound is normal healing.  If the area becomes very sore and red, or develops a thick yellow-green material (pus), it may be infected; please notify us.    If you have stitches, return to clinic as directed to have the stitches removed. You will continue wound care for 2-3 days after  the stitches are removed.   Wound healing continues for up to one year following surgery. It is not unusual to experience pain in the scar from time to time during the interval.  If the pain becomes severe or the scar thickens, you should notify the office.    A slight amount of redness in a scar is expected for the first six months.  After six months, the redness will fade and the scar will soften and fade.  The color difference becomes less noticeable with time.  If there are any problems, return for a post-op surgery check at your earliest convenience.  To improve the appearance of the scar, you can use silicone scar gel, cream, or sheets (such as Mederma or Serica) every night for up to one year. These are available over the counter (without a prescription).  Please call our office at [phone] for any questions or concerns.

## 2020-06-04 NOTE — Progress Notes (Addendum)
   New Patient Visit  Subjective  Melanie Hood is a 65 y.o. female who presents for the following: New Patient (Initial Visit) (Patient here today she states she has not been here for several years. Today she is concercerned with spot on right hand. Patient states it comes and goes for about a year. Patient states that it burns and hurts if it gets wet. Denies any new meds, ointments, treatments, perfumes. Right thumb finger she complains of crack in nails. ).  Grandfather has history of scleroderma   Objective  Well appearing patient in no apparent distress; mood and affect are within normal limits.  A focused examination was performed including right hand , right thumb nail. Relevant physical exam findings are noted in the Assessment and Plan.  Objective  Right Dorsal Hand: 4 x 4 edematous plaque with hyperpigmentation   Images    Objective  Right Thumb Nail Plate: Split nail plate   Objective  Left 3rd Finger Nail Plate: Thinning of nail plate with longitudinal ridges  Assessment & Plan  Rash and other nonspecific skin eruption Right Dorsal Hand  Family history of scleroderma    Biopsy to r/o sclerodema      Skin / nail biopsy - Right Dorsal Hand Type of biopsy: punch   Informed consent: discussed and consent obtained   Timeout: patient name, date of birth, surgical site, and procedure verified   Patient was prepped and draped in usual sterile fashion: Area prepped with isopropyl alcohol. Anesthesia: the lesion was anesthetized in a standard fashion   Anesthetic:  1% lidocaine w/ epinephrine 1-100,000 buffered w/ 8.4% NaHCO3 Punch size:  4 mm Suture size:  4-0 Suture type: Prolene (polypropylene)   Hemostasis achieved with: suture and aluminum chloride   Outcome: patient tolerated procedure well   Post-procedure details: wound care instructions given   Additional details:  Mupirocin and a dressing applied  Specimen 1 - Surgical pathology Differential  Diagnosis: r/o scleroderma    Check Margins: No 4 x 4 edematous plaque with hyperpigmentation  Nail problem Right Thumb Nail Plate  Nail culture     Other Related Procedures Culture, Fungus with Smear  Onychorrhexis Left 3rd Finger Nail Plate  Reassured benign. Recommend hydration with Cutemol/ OTC emollient.  Return in about 2 weeks (around 06/18/2020) for SR removal and follow up on right hand and right thumb .  I, Ruthell Rummage, CMA, am acting as scribe for Forest Gleason, MD.  Documentation: I have reviewed the above documentation for accuracy and completeness, and I agree with the above.  Forest Gleason, MD

## 2020-06-12 ENCOUNTER — Encounter: Payer: Self-pay | Admitting: Dermatology

## 2020-06-12 ENCOUNTER — Telehealth: Payer: Self-pay | Admitting: Dermatology

## 2020-06-12 DIAGNOSIS — L985 Mucinosis of the skin: Secondary | ICD-10-CM

## 2020-06-12 MED ORDER — MUPIROCIN 2 % EX OINT: 1.0000 "application " | TOPICAL_OINTMENT | Freq: Two times a day (BID) | CUTANEOUS | 1 refills | Status: DC

## 2020-06-12 NOTE — Telephone Encounter (Signed)
Mupirocin ordered. See surgical path results note for details.

## 2020-06-17 LAB — PROTEIN ELECTROPHORESIS, SERUM
A/G Ratio: 1 (ref 0.7–1.7)
Albumin ELP: 3.7 g/dL (ref 2.9–4.4)
Alpha 1: 0.2 g/dL (ref 0.0–0.4)
Alpha 2: 0.7 g/dL (ref 0.4–1.0)
Beta: 1.2 g/dL (ref 0.7–1.3)
Gamma Globulin: 1.6 g/dL (ref 0.4–1.8)
Globulin, Total: 3.7 g/dL (ref 2.2–3.9)
Total Protein: 7.4 g/dL (ref 6.0–8.5)

## 2020-06-17 LAB — HIV ANTIBODY (ROUTINE TESTING W REFLEX): HIV Screen 4th Generation wRfx: NONREACTIVE

## 2020-06-17 LAB — HEPATITIS C ANTIBODY: Hep C Virus Ab: <0.1 s/co ratio (ref 0.0–0.9)

## 2020-06-18 ENCOUNTER — Other Ambulatory Visit: Payer: Self-pay

## 2020-06-18 ENCOUNTER — Ambulatory Visit (INDEPENDENT_AMBULATORY_CARE_PROVIDER_SITE_OTHER): Payer: 59 | Admitting: Dermatology

## 2020-06-18 DIAGNOSIS — L985 Mucinosis of the skin: Secondary | ICD-10-CM | POA: Diagnosis not present

## 2020-06-18 MED ORDER — CLOBETASOL PROPIONATE 0.05 % EX OINT: 1.0000 "application " | TOPICAL_OINTMENT | Freq: Two times a day (BID) | CUTANEOUS | 1 refills | Status: DC

## 2020-06-18 NOTE — Patient Instructions (Signed)
Once bx site healed may start clobetasol 0.05% oint twice daily for 3 weeks. Avoid applying to face, groin, and axilla. Use as directed. Risk of skin atrophy with long-term use reviewed.   Topical steroids (such as triamcinolone, fluocinolone, fluocinonide, mometasone, clobetasol, halobetasol, betamethasone, hydrocortisone) can cause thinning and lightening of the skin if they are used for too long in the same area. Your physician has selected the right strength medicine for your problem and area affected on the body. Please use your medication only as directed by your physician to prevent side effects.

## 2020-06-18 NOTE — Progress Notes (Signed)
   Follow-Up Visit   Subjective  Melanie Hood is a 65 y.o. female who presents for the following: Follow-up (Patient here today for suture removal at right hand (bx proven Lichen Myxedematosus) and discussion of treatment).   The following portions of the chart were reviewed this encounter and updated as appropriate:   Tobacco  Allergies  Meds  Problems  Med Hx  Surg Hx  Fam Hx      Review of Systems:  No other skin or systemic complaints except as noted in HPI or Assessment and Plan.  Objective  Well appearing patient in no apparent distress; mood and affect are within normal limits.  A focused examination was performed including right hand. Relevant physical exam findings are noted in the Assessment and Plan.  Objective  Right Hand: Bx proven   Assessment & Plan  Lichen myxedematosus Right Hand  Chronic condition with duration or expected duration over one year. Condition is bothersome to patient. Currently flared.  Present for approximately 1 year.   SPEP, HIV, HCV and TSH reviewed and unremarkable (pt with stable thyroid disease)  Once bx site healed may start clobetasol 0.05% oint twice daily for 3 weeks. Avoid applying to face, groin, and axilla. Use as directed. Risk of skin atrophy with long-term use reviewed.   Topical steroids (such as triamcinolone, fluocinolone, fluocinonide, mometasone, clobetasol, halobetasol, betamethasone, hydrocortisone) can cause thinning and lightening of the skin if they are used for too long in the same area. Your physician has selected the right strength medicine for your problem and area affected on the body. Please use your medication only as directed by your physician to prevent side effects.    Ordered Medications: clobetasol ointment (TEMOVATE) 0.05 %  Return in about 3 months (around 09/18/2020).  Graciella Belton, RMA, am acting as scribe for Forest Gleason, MD .  Documentation: I have reviewed the above documentation  for accuracy and completeness, and I agree with the above.  Forest Gleason, MD

## 2020-06-23 ENCOUNTER — Encounter: Payer: Self-pay | Admitting: Dermatology

## 2020-06-25 ENCOUNTER — Telehealth: Payer: Self-pay

## 2020-06-25 LAB — FUNGUS CULTURE W SMEAR

## 2020-06-25 NOTE — Telephone Encounter (Signed)
-----   Message from [place], MD sent at 06/25/2020  3:18 PM EDT ----- The lab did not grow fungus from the nail clipping but also noted they did not have a large enough sample. If she would like to check again for fungus, I would recommend growing out the nail longer and then we can send a larger clipping at her follow-up visit to check under the microscope (H&E) to look for fungus within the nail.   MAs please call. Thank you!

## 2020-06-25 NOTE — Telephone Encounter (Signed)
Advised pt fungal culture negative, but the lab noted they didn't have a large enough sample.  Advised if she would like to repeat fungal culture we recommend growing out the nail and repeating culture at f/u appt./sh

## 2020-08-14 ENCOUNTER — Ambulatory Visit: Payer: 59 | Admitting: Cardiology

## 2020-09-03 NOTE — Progress Notes (Signed)
Cardiology Office Note:    Date:  09/04/2020   ID:  Hood Hood [birth year], MRN [id number]  PCP:  Cari Caraway, MD   [facility] Providers Cardiologist:  Ena Dawley, MD (Inactive) {  Referring MD: Cari Caraway, MD    History of Present Illness:    Hood Hood is a 65 y.o. female with a hx of chronic diastolic CHF, pulmonary HTN, morbid obesity, DM, HTN, sleep apnea on CPAP, HLD, prior situational depression, and exercise induced asthma who was previously followed by Dr. Meda Coffee who presents to clinic for follow-up.  Per review of the record, in 10/2017 she had been seen for DOE, LE edema, and tachycardia with ambulation.D-dimer was elevated at 1.64. LE venous duplex was negative for DVT. CT angio 09/2017 showed no PE or acute process, enlarged main pulmonary artery. TTE 10/2017 showed normal EF at 60-65%, G1DD, normal wall motion - unfortunately, pulmonary artery systolic BP could not be accurately estimated. Coronary CTA was normal. She underwent right heart cath 11/08/17 showing mild pulmonary HTN, mildly elevated pulmonary capillary wedge pressure mean of 22 mmHg, no L-R shunting - findings suggested left ventricular diastolic dysfunction as the cause of the patient's pulmonary hypertension.  Last saw Dr. Meda Coffee on 05/30/19 where she was doing well from a CV standpoint.  Today, the patient states that she feels a little short of breath which she attributes to weight gain. Admits to not doing aerobic exercise for several months but she has gotten back into it. She has started a vigorous exercise program though work ([employer]) abd has lost about 20 lbs since starting. She is working out 5 days a week and monitoring her diet as well. Since starting the exercise program and starting to lose weight loss, she is feeling better. No exertional chest pain. No lightheadedness, dizziness, nausea or vomiting. Blood pressure is well controlled. LE edema  significantly improved and has not been having to use lasix.    Past Medical History:  Diagnosis Date  . Allergic rhinitis   . Arthritis   . Chronic back pain   . Chronic diastolic CHF (congestive heart failure) ([place])   . Diabetes mellitus    type 11   . Diverticular disease of colon   . Exercise-induced asthma   . GERD (gastroesophageal reflux disease)   . Graves disease    status post radioactive iodine treatment  . Hypertension    mild left vent dysfunction   . Hypothyroidism   . Morbid obesity ([place])   . Obesity   . Pulmonary hypertension ([place])   . Sleep apnea    cpap-automatic settings  . Vein, varicose     Past Surgical History:  Procedure Laterality Date  . ABDOMINAL HYSTERECTOMY    . APPENDECTOMY    . BREAST BIOPSY Left 1994  . CHOLECYSTECTOMY    . COLONOSCOPY N/A 07/18/2015   Procedure: COLONOSCOPY;  Surgeon: Teena Irani, MD;  Location: [facility];  Service: Endoscopy;  Laterality: N/A;  . ESOPHAGOGASTRODUODENOSCOPY N/A 07/18/2015   Procedure: ESOPHAGOGASTRODUODENOSCOPY (EGD);  Surgeon: Teena Irani, MD;  Location: [facility];  Service: Endoscopy;  Laterality: N/A;  . EYE SURGERY    . FOOT ARTHROPLASTY     rt foot as child  . KNEE ARTHROSCOPY  04/20/2011   Procedure: ARTHROSCOPY KNEE;  Surgeon: Hessie Dibble, MD;  Location: [facility];  Service: Orthopedics;  Laterality: Right;  right knee arthroscopy partial medial menisectomy and chondroplasty.  Marland Kitchen RIGHT HEART CATH  N/A 11/08/2017   Procedure: RIGHT HEART CATH;  Surgeon: Belva Crome, MD;  Location: [facility];  Service: Cardiovascular;  Laterality: N/A;  . TOTAL KNEE ARTHROPLASTY Right 12/11/2013   Procedure: TOTAL KNEE ARTHROPLASTY;  Surgeon: Hessie Dibble, MD;  Location: [facility];  Service: Orthopedics;  Laterality: Right;  . TOTAL KNEE ARTHROPLASTY Left 02/05/2014   Procedure: LEFT TOTAL KNEE ARTHROPLASTY;  Surgeon: Hessie Dibble, MD;  Location: [facility];  Service: Orthopedics;   Laterality: Left;  . UPPER GASTROINTESTINAL ENDOSCOPY    . VARICOSE VEIN SURGERY      Current Medications: Current Meds  Medication Sig  . acetaminophen (TYLENOL) 650 MG CR tablet Take 650 mg by mouth every 12 (twelve) hours.  Marland Kitchen albuterol (VENTOLIN HFA) 108 (90 Base) MCG/ACT inhaler Inhale into the lungs every 6 (six) hours as needed for wheezing or shortness of breath.  Marland Kitchen aspirin 81 MG tablet Take 81 mg by mouth every evening.   Marland Kitchen atorvastatin (LIPITOR) 20 MG tablet Take 20 mg by mouth daily.  . Biotin w/ Vitamins C & E (HAIR/SKIN/NAILS PO) Take by mouth daily.  . cetirizine (ZYRTEC) 10 MG tablet Take 10 mg by mouth every evening.   . Cholecalciferol (VITAMIN D-3) 5000 UNITS TABS Take 5,000 Units by mouth 2 (two) times a week.  . Chromium-Cinnamon [phone] MCG-MG CAPS Take 1 capsule by mouth 2 (two) times daily.   . citalopram (CELEXA) 20 MG tablet Take 20 mg by mouth every evening.   . clobetasol ointment (TEMOVATE) 8.41 % Apply 1 application topically 2 (two) times daily. To affected area at hand for 3 weeks. Avoid applying to face, groin, and axilla. Use as directed. Risk of skin atrophy with long-term use reviewed.  . CRANBERRY PO Take by mouth daily.  . fluticasone (FLONASE) 50 MCG/ACT nasal spray Place 2 sprays into both nostrils every morning.  . hydrochlorothiazide (MICROZIDE) 12.5 MG capsule Take 12.5 mg by mouth every evening.   Marland Kitchen ketoconazole (NIZORAL) 2 % shampoo Apply 1 application topically as needed for itching.  . levothyroxine (SYNTHROID) 175 MCG tablet Take 175 mcg by mouth daily before breakfast.   . lisinopril (ZESTRIL) 5 MG tablet Take 1 tablet (5 mg total) by mouth daily.  . Magnesium Oxide (MAG-OX PO) Take 500 mg by mouth daily.  . metFORMIN (GLUMETZA) 500 MG (MOD) 24 hr tablet Take 500 mg by mouth every evening.  . therapeutic multivitamin-minerals (THERAGRAN-M) tablet Take 1 tablet by mouth every morning.  . Turmeric 500 MG CAPS Take 500 mg by mouth daily.  .  valACYclovir (VALTREX) 500 MG tablet Take 500 mg by mouth every evening.   . [DISCONTINUED] diltiazem (CARDIZEM CD) 180 MG 24 hr capsule Take 1 capsule (180 mg total) by mouth daily.     Allergies:   Septra [sulfamethoxazole w/trimethoprim (co-trimoxazole)], Metoprolol tartrate, Minocycline, Codeine, Erythromycin, Nsaids, Septra [sulfamethoxazole-trimethoprim], and Tetracyclines & related   Social History   Socioeconomic History  . Marital status: Single    Spouse name: Not on file  . Number of children: Not on file  . Years of education: Not on file  . Highest education level: Not on file  Occupational History  . Not on file  Tobacco Use  . Smoking status: Former Smoker    Quit date: 04/12/1986    Years since quitting: 34.4  . Smokeless tobacco: Never Used  Vaping Use  . Vaping Use: Never used  Substance and Sexual Activity  . Alcohol use: Yes  Alcohol/week: 0.0 standard drinks    Comment: rarely   . Drug use: No  . Sexual activity: Not on file  Other Topics Concern  . Not on file  Social History Narrative  . Not on file   Social Determinants of Health   Financial Resource Strain: Not on file  Food Insecurity: Not on file  Transportation Needs: Not on file  Physical Activity: Not on file  Stress: Not on file  Social Connections: Not on file     Family History: The patient's family history includes Colon cancer in her sister; Diabetes in her father and mother.  ROS:   Please see the history of present illness.    Review of Systems  Constitutional: Negative for chills and fever.  HENT: Negative for sore throat.   Eyes: Negative for blurred vision.  Respiratory: Positive for shortness of breath.   Cardiovascular: Positive for leg swelling. Negative for chest pain, palpitations, orthopnea, claudication and PND.  Gastrointestinal: Negative for nausea and vomiting.  Genitourinary: Negative for dysuria.  Musculoskeletal: Positive for back pain and joint pain.   Neurological: Negative for dizziness and loss of consciousness.  Endo/Heme/Allergies: Negative for polydipsia.  Psychiatric/Behavioral: Negative for substance abuse.    EKGs/Labs/Other Studies Reviewed:    The following studies were reviewed today: [facility] 19-Nov-2017:  Mild pulmonary hypertension based upon a mean pulmonary artery pressure of 30 mmHg.  Mildly elevated pulmonary capillary wedge pressure mean of 22 mmHg (patient did not have diuretic therapy this morning)  Oxymetry demonstrates no evidence of left to right shunting  Cardiac output 6.84 L/min and cardiac index 2.65 L/min/m  RECOMMENDATIONS:   Findings suggest left ventricular diastolic dysfunction as the cause of the patient's pulmonary hypertension.  Cardiac Monitor 10/25/17:  Sinus bradycardia, normal sinus rhythm and sinus tachycardia. The average heart rate was 78 bpm. The heart rate ranged from 52 to 164 bpm.  Occasional multifocal PVCs and ventricular couplets with PVC load less than 1%  Occasional PACs with PAC load less than 1%  TTE 10/17/17: Hood Conclusions   - Left ventricle: The cavity size was normal. There was mild focal  basal hypertrophy of the septum. Systolic function was normal.  The estimated ejection fraction was in the range of 60% to 65%.  Wall motion was normal; there were no regional wall motion  abnormalities. There was an increased relative contribution of  atrial contraction to ventricular filling. Doppler parameters are  consistent with abnormal left ventricular relaxation (grade 1  diastolic dysfunction).  - Mitral valve: Calcified annulus.  - Right ventricle: The cavity size was mildly dilated. Wall  thickness was normal.  - Pulmonary arteries: Systolic pressure could not be accurately  estimated.   Impressions:   - NL LVF, grade 1 diastolic dysfunction, mild BSH, trivial PR,  mildly dilated RV.   EKG:  EKG is  ordered today.  The ekg ordered today  demonstrates NSR with HR 70  Recent Labs: No results found for requested labs within last 8760 hours.  Recent Lipid Panel No results found for: CHOL, TRIG, HDL, CHOLHDL, VLDL, LDLCALC, LDLDIRECT     Physical Exam:    VS:  BP 126/70   Pulse 70   Ht 5\' 11"  (1.803 m)   Wt (!) 325 lb (147.4 kg)   SpO2 96%   BMI 45.33 kg/m     Wt Readings from Last 3 Encounters:  09/04/20 (!) 325 lb (147.4 kg)  05/30/19 (!) 324 lb (147 kg)  10/17/18 (!) 342 lb  6.4 oz (155.3 kg)     GEN:  Well nourished, well developed in no acute distress HEENT: Normal NECK: No JVD; No carotid bruits CARDIAC: RRR, no murmurs, rubs, gallops RESPIRATORY:  Clear to auscultation without rales, wheezing or rhonchi  ABDOMEN: Obese, soft, non-tender, non-distended MUSCULOSKELETAL:  Trace bilateral ankle edema, warm SKIN: Warm and dry NEUROLOGIC:  Alert and oriented x 3 PSYCHIATRIC:  Normal affect   ASSESSMENT:    1. Chronic diastolic CHF (congestive heart failure) ([place])   2. Pulmonary hypertension, unspecified ([place])   3. Mixed hyperlipidemia   4. Essential hypertension   5. Morbid obesity ([place])    PLAN:    In order of problems listed above:  #Chronic Diastolic Heart Failure: Stable and euvolemic on examination. Blood pressure well controlled. Currently with NYHA class II symptoms -Continue diuresis with lasix 20mg  as needed (not requiring) -Continue lisinopril 5mg  daily -Repeat TTE to monitor pulmonary HTN -Low Na diet -Continue weight loss efforst  #Pulmonary HTN: Thought to be secondary to diastolic dysfunction, obesity and OSA. -Repeat TTE for monitoring -Management of HTN as below -Continue weight loss efforts through exercise program -Compliant with home CPAP  #HTN: Well controlled and at goal <120s/80s. -Continue lisinopril 5mg  daily -Continue diltiazem 180mg  daily -Continue HCTZ 12.5nmg daily  #HLD: LDL at goal at 70 (10/03/19). -Continue lipitor 20mg  daily  #Morbid Obesity: BMI  45. Patient very motivated to lose weight and enrolled in vigorous exercise program.  -Diet and exercise emphasized as detailed below  Exercise recommendations: Goal of exercising for at least 30 minutes a day, at least 5 times per week.  Please exercise to a moderate exertion.  This means that while exercising it is difficult to speak in full sentences, however you are not so short of breath that you feel you must stop, and not so comfortable that you can carry on a full conversation.  Exertion level should be approximately a 5/10, if 10 is the most exertion you can perform.  Diet recommendations: Recommend a heart healthy diet such as the Mediterranean diet.  This diet consists of plant based foods, healthy fats, lean meats, olive oil.  It suggests limiting the intake of simple carbohydrates such as white breads, pastries, and pastas.  It also limits the amount of red meat, wine, and dairy products such as cheese that one should consume on a daily basis.    Medication Adjustments/Labs and Tests Ordered: Current medicines are reviewed at length with the patient today.  Concerns regarding medicines are outlined above.  Orders Placed This Encounter  Procedures  . EKG 12-Lead  . ECHOCARDIOGRAM COMPLETE   Meds ordered this encounter  Medications  . diltiazem (CARDIZEM CD) 180 MG 24 hr capsule    Sig: Take 1 capsule (180 mg total) by mouth daily.    Dispense:  90 capsule    Refill:  2    Patient Instructions  Medication Instructions:   Your physician recommends that you continue on your current medications as directed. Please refer to the Current Medication list given to you today.  *If you need a refill on your cardiac medications before your next appointment, please call your pharmacy*   Testing/Procedures:  Your physician has requested that you have an echocardiogram. Echocardiography is a painless test that uses sound waves to create images of your heart. It provides your doctor  with information about the size and shape of your heart and how well your heart's chambers and valves are working. This procedure takes approximately one  hour. There are no restrictions for this procedure.   Follow-Up: At [facility], you and your health needs are our priority.  As part of our continuing mission to provide you with exceptional heart care, we have created designated Provider Care Teams.  These Care Teams include your primary Cardiologist (physician) and Advanced Practice Providers (APPs -  Physician Assistants and Nurse Practitioners) who all work together to provide you with the care you need, when you need it.  We recommend signing up for the patient portal called "MyChart".  Sign up information is provided on this After Visit Summary.  MyChart is used to connect with patients for Virtual Visits (Telemedicine).  Patients are able to view lab/test results, encounter notes, upcoming appointments, etc.  Non-urgent messages can be sent to your provider as well.   To learn more about what you can do with MyChart, go to [email or web address].    Your next appointment:   8 month(s)  The format for your next appointment:   In Person  Provider:   Gwyndolyn Kaufman, MD       Signed, Freada Bergeron, MD  09/04/2020 9:21 AM    [place]

## 2020-09-04 ENCOUNTER — Other Ambulatory Visit: Payer: Self-pay

## 2020-09-04 ENCOUNTER — Ambulatory Visit (INDEPENDENT_AMBULATORY_CARE_PROVIDER_SITE_OTHER): Payer: 59 | Admitting: Cardiology

## 2020-09-04 ENCOUNTER — Encounter: Payer: Self-pay | Admitting: Cardiology

## 2020-09-04 VITALS — BP 126/70 | HR 70 | Ht 71.0 in | Wt 325.0 lb

## 2020-09-04 DIAGNOSIS — I5032 Chronic diastolic (congestive) heart failure: Secondary | ICD-10-CM | POA: Diagnosis not present

## 2020-09-04 DIAGNOSIS — I272 Pulmonary hypertension, unspecified: Secondary | ICD-10-CM | POA: Diagnosis not present

## 2020-09-04 DIAGNOSIS — I10 Essential (primary) hypertension: Secondary | ICD-10-CM | POA: Diagnosis not present

## 2020-09-04 DIAGNOSIS — E782 Mixed hyperlipidemia: Secondary | ICD-10-CM

## 2020-09-04 MED ORDER — DILTIAZEM HCL ER COATED BEADS 180 MG PO CP24: 180.0000 mg | ORAL_CAPSULE | Freq: Every day | ORAL | 2 refills | Status: AC

## 2020-09-04 NOTE — Patient Instructions (Signed)
Medication Instructions:   Your physician recommends that you continue on your current medications as directed. Please refer to the Current Medication list given to you today.  *If you need a refill on your cardiac medications before your next appointment, please call your pharmacy*   Testing/Procedures:  Your physician has requested that you have an echocardiogram. Echocardiography is a painless test that uses sound waves to create images of your heart. It provides your doctor with information about the size and shape of your heart and how well your heart's chambers and valves are working. This procedure takes approximately one hour. There are no restrictions for this procedure.   Follow-Up: At [facility], you and your health needs are our priority.  As part of our continuing mission to provide you with exceptional heart care, we have created designated Provider Care Teams.  These Care Teams include your primary Cardiologist (physician) and Advanced Practice Providers (APPs -  Physician Assistants and Nurse Practitioners) who all work together to provide you with the care you need, when you need it.  We recommend signing up for the patient portal called "MyChart".  Sign up information is provided on this After Visit Summary.  MyChart is used to connect with patients for Virtual Visits (Telemedicine).  Patients are able to view lab/test results, encounter notes, upcoming appointments, etc.  Non-urgent messages can be sent to your provider as well.   To learn more about what you can do with MyChart, go to [email or web address].    Your next appointment:   8 month(s)  The format for your next appointment:   In Person  Provider:   Gwyndolyn Kaufman, MD

## 2020-09-11 ENCOUNTER — Ambulatory Visit: Payer: 59 | Admitting: Dermatology

## 2020-09-17 ENCOUNTER — Other Ambulatory Visit (HOSPITAL_COMMUNITY): Payer: Self-pay | Admitting: Gastroenterology

## 2020-09-17 DIAGNOSIS — K5792 Diverticulitis of intestine, part unspecified, without perforation or abscess without bleeding: Secondary | ICD-10-CM

## 2020-09-17 DIAGNOSIS — R110 Nausea: Secondary | ICD-10-CM

## 2020-09-17 DIAGNOSIS — R109 Unspecified abdominal pain: Secondary | ICD-10-CM

## 2020-09-23 ENCOUNTER — Telehealth: Payer: Self-pay | Admitting: Cardiology

## 2020-09-23 NOTE — Telephone Encounter (Signed)
    Pt is requesting to speak with Karlene Einstein, she said she will tell her what she needs when she speaks with her

## 2020-09-23 NOTE — Telephone Encounter (Signed)
RN returned call to patient regarding a question she had. Patient states that she is needing FMLA paperwork filled out and was not sure about the process in which it needed to be completed. RN advised she could bring it by the office to be given to medical records and pay the associated fee. Patient verbalized understanding.

## 2020-09-25 ENCOUNTER — Other Ambulatory Visit: Payer: Self-pay

## 2020-09-25 ENCOUNTER — Ambulatory Visit (HOSPITAL_COMMUNITY)
Admission: RE | Admit: 2020-09-25 | Discharge: 2020-09-25 | Disposition: A | Discharge status: Home / Self Care | Payer: 59 | Source: Ambulatory Visit | Attending: Gastroenterology | Admitting: Gastroenterology

## 2020-09-25 DIAGNOSIS — R109 Unspecified abdominal pain: Secondary | ICD-10-CM | POA: Insufficient documentation

## 2020-09-25 DIAGNOSIS — K5792 Diverticulitis of intestine, part unspecified, without perforation or abscess without bleeding: Secondary | ICD-10-CM | POA: Diagnosis present

## 2020-09-25 LAB — POCT I-STAT CREATININE: Creatinine, Ser: 0.9 mg/dL (ref 0.44–1.00)

## 2020-09-25 MED ORDER — IOHEXOL 300 MG/ML  SOLN
100.0000 mL | Freq: Once | INTRAMUSCULAR | Status: AC | PRN
  Administered 2020-09-25: 100 mL via INTRAVENOUS

## 2020-09-25 MED ORDER — SODIUM CHLORIDE (PF) 0.9 % IJ SOLN
INTRAMUSCULAR | Status: AC
  Filled 2020-09-25: qty 50

## 2020-10-02 ENCOUNTER — Ambulatory Visit (HOSPITAL_COMMUNITY): Payer: 59 | Attending: Cardiology

## 2020-10-02 ENCOUNTER — Other Ambulatory Visit: Payer: Self-pay

## 2020-10-02 DIAGNOSIS — I272 Pulmonary hypertension, unspecified: Secondary | ICD-10-CM | POA: Diagnosis not present

## 2020-10-02 LAB — ECHOCARDIOGRAM COMPLETE
Area-P 1/2: 3.68 cm2
S' Lateral: 3.3 cm

## 2020-10-08 ENCOUNTER — Other Ambulatory Visit (HOSPITAL_COMMUNITY): Payer: 59

## 2020-10-20 ENCOUNTER — Other Ambulatory Visit: Payer: Self-pay | Admitting: Family Medicine

## 2020-10-20 DIAGNOSIS — E2839 Other primary ovarian failure: Secondary | ICD-10-CM

## 2020-11-20 ENCOUNTER — Other Ambulatory Visit: Payer: Self-pay | Admitting: Family Medicine

## 2020-11-20 DIAGNOSIS — Z1231 Encounter for screening mammogram for malignant neoplasm of breast: Secondary | ICD-10-CM

## 2020-12-25 ENCOUNTER — Ambulatory Visit: Payer: 59 | Admitting: Dermatology

## 2021-01-01 ENCOUNTER — Other Ambulatory Visit: Payer: Self-pay | Admitting: Family Medicine

## 2021-01-01 DIAGNOSIS — M25511 Pain in right shoulder: Secondary | ICD-10-CM

## 2021-01-11 ENCOUNTER — Other Ambulatory Visit: Payer: Self-pay

## 2021-01-11 ENCOUNTER — Ambulatory Visit
Admission: RE | Admit: 2021-01-11 | Discharge: 2021-01-11 | Disposition: A | Discharge status: Home / Self Care | Payer: 59 | Source: Ambulatory Visit | Attending: Family Medicine | Admitting: Family Medicine

## 2021-01-11 DIAGNOSIS — M25511 Pain in right shoulder: Secondary | ICD-10-CM

## 2021-02-25 ENCOUNTER — Ambulatory Visit
Admission: RE | Admit: 2021-02-25 | Discharge: 2021-02-25 | Disposition: A | Discharge status: Home / Self Care | Payer: 59 | Source: Ambulatory Visit | Attending: Family Medicine | Admitting: Family Medicine

## 2021-02-25 ENCOUNTER — Other Ambulatory Visit: Payer: Self-pay

## 2021-02-25 DIAGNOSIS — Z1231 Encounter for screening mammogram for malignant neoplasm of breast: Secondary | ICD-10-CM

## 2021-05-13 ENCOUNTER — Ambulatory Visit
Admission: RE | Admit: 2021-05-13 | Discharge: 2021-05-13 | Disposition: A | Discharge status: Home / Self Care | Payer: 59 | Source: Ambulatory Visit | Attending: Family Medicine | Admitting: Family Medicine

## 2021-05-13 DIAGNOSIS — E2839 Other primary ovarian failure: Secondary | ICD-10-CM

## 2021-07-16 ENCOUNTER — Other Ambulatory Visit: Payer: Self-pay | Admitting: Orthopaedic Surgery

## 2021-07-16 DIAGNOSIS — M545 Low back pain, unspecified: Secondary | ICD-10-CM

## 2021-08-02 ENCOUNTER — Ambulatory Visit
Admission: RE | Admit: 2021-08-02 | Discharge: 2021-08-02 | Disposition: A | Discharge status: Home / Self Care | Payer: 59 | Source: Ambulatory Visit | Attending: Orthopaedic Surgery | Admitting: Orthopaedic Surgery

## 2021-08-02 DIAGNOSIS — M545 Low back pain, unspecified: Secondary | ICD-10-CM

## 2021-08-05 ENCOUNTER — Ambulatory Visit
Admission: RE | Admit: 2021-08-05 | Discharge: 2021-08-05 | Disposition: A | Discharge status: Home / Self Care | Payer: 59 | Source: Ambulatory Visit | Attending: Orthopaedic Surgery | Admitting: Orthopaedic Surgery

## 2021-11-11 ENCOUNTER — Telehealth: Payer: Self-pay | Admitting: Cardiology

## 2021-11-11 NOTE — Progress Notes (Signed)
Cardiology Office Note:    Date:  11/13/2021   ID:  Melanie, Hood March 08, 1956, MRN [id number]  PCP:  Cari Caraway, MD   [facility] Providers Cardiologist:  Ena Dawley, MD {  Referring MD: Cari Caraway, MD    History of Present Illness:    Melanie Hood is a 66 y.o. female with a hx of chronic diastolic CHF, pulmonary HTN, morbid obesity, DM, HTN, sleep apnea on CPAP, HLD, prior situational depression, and exercise induced asthma who was previously followed by Dr. Meda Coffee who presents to clinic for follow-up.  Per review of the record, in 10/2017 she had been seen for DOE, LE edema, and tachycardia with ambulation. D-dimer was elevated at 1.64. LE venous duplex was negative for DVT. CT angio 09/2017 showed no PE or acute process, enlarged main pulmonary artery. TTE 10/2017 showed normal EF at 60-65%, G1DD, normal wall motion - unfortunately, pulmonary artery systolic BP could not be accurately estimated. Coronary CTA was normal. She underwent right heart cath 11/08/17 showing mild pulmonary HTN, mildly elevated pulmonary capillary wedge pressure mean of 22 mmHg, no L-R shunting - findings suggested left ventricular diastolic dysfunction as the cause of the patient's pulmonary hypertension.  Last seen in clinic 09/04/20 where she remained very active, but was complaining of SOB. TTE 09/2020 LVEF 60-65%, normal RV, normal PASP, no significant valve disease.   Called clinic on 11/11/21 for SOB with exercise. Has notably increased her exercise significantly as part of the weight loss program. Given symptoms, she is now planned for follow-up today.  Today, the patient states that she had been increasing her exercise and was doing well until about 1.6month ago when she began to feel more fatigued. States she feels like she could sleep more often and lacks energy to get up and move like she used to. She continues to have dyspnea with exertion with walking outside. She has always felt  short of breath with walking but she states the breathing is worse currently. She also feels congested in her chest. No lightheadedness, chest pain, orthopnea, PND, or palpitations. No LE edema. Has lost about 30lbs. She wants to start to feel better so she can resume her exercise routines.  Notably, TSH was normal with labs with PCP in 07/2021  Past Medical History:  Diagnosis Date   Allergic rhinitis    Arthritis    Chronic back pain    Chronic diastolic CHF (congestive heart failure) (HCC)    Diabetes mellitus    type 11    Diverticular disease of colon    Exercise-induced asthma    GERD (gastroesophageal reflux disease)    Graves disease    status post radioactive iodine treatment   Hypertension    mild left vent dysfunction    Hypothyroidism    Morbid obesity ([place]    Obesity    Pulmonary hypertension ([place]    Sleep apnea    cpap-automatic settings   Vein, varicose     Past Surgical History:  Procedure Laterality Date   ABDOMINAL HYSTERECTOMY     APPENDECTOMY     BREAST BIOPSY Left 1994   CHOLECYSTECTOMY     COLONOSCOPY N/A 07/18/2015   Procedure: COLONOSCOPY;  Surgeon: JTeena Irani MD;  Location: [facility];  Service: Endoscopy;  Laterality: N/A;   ESOPHAGOGASTRODUODENOSCOPY N/A 07/18/2015   Procedure: ESOPHAGOGASTRODUODENOSCOPY (EGD);  Surgeon: JTeena Irani MD;  [facility];  Service: Endoscopy;  Laterality: N/A;   EYE SURGERY     FOOT ARTHROPLASTY  rt foot as child   KNEE ARTHROSCOPY  04/20/2011   Procedure: ARTHROSCOPY KNEE;  Surgeon: Hessie Dibble, MD;  Location: [place];  Service: Orthopedics;  Laterality: Right;  right knee arthroscopy partial medial menisectomy and chondroplasty.   RIGHT HEART CATH N/A 11/08/2017   Procedure: RIGHT HEART CATH;  Surgeon: Belva Crome, MD;  Location: [place] [facility];  Service: Cardiovascular;  Laterality: N/A;   TOTAL KNEE ARTHROPLASTY Right 12/11/2013   Procedure: TOTAL KNEE ARTHROPLASTY;   Surgeon: Hessie Dibble, MD;  Location: [place];  Service: Orthopedics;  Laterality: Right;   TOTAL KNEE ARTHROPLASTY Left 02/05/2014   Procedure: LEFT TOTAL KNEE ARTHROPLASTY;  Surgeon: Hessie Dibble, MD;  Location: [place];  Service: Orthopedics;  Laterality: Left;   UPPER GASTROINTESTINAL ENDOSCOPY     VARICOSE VEIN SURGERY      Current Medications: Current Meds  Medication Sig   acetaminophen (TYLENOL) 650 MG CR tablet Take 650 mg by mouth every 12 (twelve) hours.   albuterol (VENTOLIN HFA) 108 (90 Base) MCG/ACT inhaler Inhale into the lungs every 6 (six) hours as needed for wheezing or shortness of breath.   aspirin 81 MG tablet Take 81 mg by mouth 2 (two) times a week.   atorvastatin (LIPITOR) 20 MG tablet Take 20 mg by mouth daily.   azelastine (ASTELIN) 0.1 % nasal spray Place 1 spray into both nostrils 2 (two) times daily as needed for rhinitis. Use in each nostril as directed   Biotin w/ Vitamins C & E (HAIR/SKIN/NAILS PO) Take by mouth daily.   cetirizine (ZYRTEC) 10 MG tablet Take 10 mg by mouth every evening.    Cholecalciferol (VITAMIN D-3) 5000 UNITS TABS Take 5,000 Units by mouth 2 (two) times a week.   citalopram (CELEXA) 20 MG tablet Take 20 mg by mouth every evening.    clobetasol ointment (TEMOVATE) 4.01 % Apply 1 application topically 2 (two) times daily. To affected area at hand for 3 weeks. Avoid applying to face, groin, and axilla. Use as directed. Risk of skin atrophy with long-term use reviewed.   diltiazem (CARDIZEM CD) 180 MG 24 hr capsule Take 1 capsule (180 mg total) by mouth daily.   fluticasone (FLONASE) 50 MCG/ACT nasal spray Place 2 sprays into both nostrils every morning.   hydrochlorothiazide (MICROZIDE) 12.5 MG capsule Take 12.5 mg by mouth every evening.    levothyroxine (SYNTHROID) 175 MCG tablet Take 150 mcg by mouth daily before breakfast.   lisinopril (ZESTRIL) 5 MG tablet Take 1 tablet (5 mg total) by mouth daily.   Magnesium Oxide (MAG-OX PO) Take  500 mg by mouth daily.   metFORMIN (GLUMETZA) 500 MG (MOD) 24 hr tablet Take 500 mg by mouth every evening.   Semaglutide, 2 MG/DOSE, (OZEMPIC, 2 MG/DOSE,) 8 MG/3ML SOPN Inject 2 mg into the skin once a week.   therapeutic multivitamin-minerals (THERAGRAN-M) tablet Take 1 tablet by mouth every morning.     Allergies:   Septra [sulfamethoxazole w/trimethoprim (co-trimoxazole)], Metoprolol tartrate, Minocycline, Codeine, Erythromycin, Nsaids, Septra [sulfamethoxazole-trimethoprim], and Tetracyclines & related   Social History   Socioeconomic History   Marital status: Single    Spouse name: Not on file   Number of children: Not on file   Years of education: Not on file   Highest education level: Not on file  Occupational History   Not on file  Tobacco Use   Smoking status: Former    Types: Cigarettes    Quit date: 04/12/1986  Years since quitting: 35.6   Smokeless tobacco: Never  Vaping Use   Vaping Use: Never used  Substance and Sexual Activity   Alcohol use: Yes    Alcohol/week: 0.0 standard drinks of alcohol    Comment: rarely    Drug use: No   Sexual activity: Not on file  Other Topics Concern   Not on file  Social History Narrative   Not on file   Social Determinants of Health   Financial Resource Strain: Not on file  Food Insecurity: Not on file  Transportation Needs: Not on file  Physical Activity: Not on file  Stress: Not on file  Social Connections: Not on file     Family History: The patient's family history includes Colon cancer in her sister; Diabetes in her father and mother. There is no history of Breast cancer.  ROS:   Please see the history of present illness.    Review of Systems  Constitutional:  Positive for malaise/fatigue. Negative for chills and fever.  HENT:  Positive for congestion. Negative for sore throat.   Eyes:  Negative for blurred vision.  Respiratory:  Positive for shortness of breath.   Cardiovascular:  Negative for chest pain,  palpitations, orthopnea, claudication, leg swelling and PND.  Gastrointestinal:  Negative for nausea and vomiting.  Genitourinary:  Negative for dysuria.  Musculoskeletal:  Positive for back pain and joint pain.  Neurological:  Negative for dizziness and loss of consciousness.  Endo/Heme/Allergies:  Negative for polydipsia.  Psychiatric/Behavioral:  Negative for substance abuse.     EKGs/Labs/Other Studies Reviewed:    The following studies were reviewed today: TTE 2020/10/26: IMPRESSIONS     1. Left ventricular ejection fraction, by estimation, is 60 to 65%. Left  ventricular ejection fraction by 3D volume is 65 %. The left ventricle has  normal function. The left ventricle has no regional wall motion  abnormalities. There is mild concentric  left ventricular hypertrophy. Left ventricular diastolic parameters are  indeterminate.   2. Right ventricular systolic function is normal. The right ventricular  size is normal. There is normal pulmonary artery systolic pressure. The  estimated right ventricular systolic pressure is 90.2 mmHg.   3. The mitral valve is normal in structure. Trivial mitral valve  regurgitation. No evidence of mitral stenosis.   4. The aortic valve is grossly normal. There is mild calcification of the  aortic valve. Aortic valve regurgitation is not visualized. Mild aortic  valve sclerosis is present, with no evidence of aortic valve stenosis.   5. The inferior vena cava is normal in size with greater than 50%  respiratory variability, suggesting right atrial pressure of 3 mmHg.   Comparison(s): No significant change from prior study.   [place] 11/08/17: Mild pulmonary hypertension based upon a mean pulmonary artery pressure of 30 mmHg. Mildly elevated pulmonary capillary wedge pressure mean of 22 mmHg (patient did not have diuretic therapy this morning) Oxymetry demonstrates no evidence of left to right shunting Cardiac output 6.84 L/min and cardiac index 2.65  L/min/m   RECOMMENDATIONS:   Findings suggest left ventricular diastolic dysfunction as the cause of the patient's pulmonary hypertension.  Cardiac Monitor 10/25/17: Sinus bradycardia, normal sinus rhythm and sinus tachycardia. The average heart rate was 78 bpm. The heart rate ranged from 52 to 164 bpm. Occasional multifocal PVCs and ventricular couplets with PVC load less than 1% Occasional PACs with PAC load less than 1%  TTE 10/17/17: Study Conclusions   - Left ventricle: The cavity size was normal.  There was mild focal    basal hypertrophy of the septum. Systolic function was normal.    The estimated ejection fraction was in the range of 60% to 65%.    Wall motion was normal; there were no regional wall motion    abnormalities. There was an increased relative contribution of    atrial contraction to ventricular filling. Doppler parameters are    consistent with abnormal left ventricular relaxation (grade 1    diastolic dysfunction).  - Mitral valve: Calcified annulus.  - Right ventricle: The cavity size was mildly dilated. Wall    thickness was normal.  - Pulmonary arteries: Systolic pressure could not be accurately    estimated.   Impressions:   - NL LVF, grade 1 diastolic dysfunction, mild BSH, trivial PR,    mildly dilated RV.   EKG:  EKG is  ordered today.  The ekg ordered today demonstrates NSR with HR 76  Recent Labs: No results found for requested labs within last 365 days.  Recent Lipid Panel No results found for: "CHOL", "TRIG", "HDL", "CHOLHDL", "VLDL", "LDLCALC", "LDLDIRECT"     Physical Exam:    VS:  BP 108/60   Pulse 76   Ht '5\' 11"'$  (1.803 m)   Wt (!) 321 lb (145.6 kg)   SpO2 92%   BMI 44.77 kg/m     Wt Readings from Last 3 Encounters:  11/13/21 (!) 321 lb (145.6 kg)  09/04/20 (!) 325 lb (147.4 kg)  05/30/19 (!) 324 lb (147 kg)     GEN:  Well nourished, well developed in no acute distress HEENT: Normal NECK: No JVD; No carotid  bruits CARDIAC: RRR, no murmurs RESPIRATORY:  Clear to auscultation without rales, wheezing or rhonchi  ABDOMEN: Obese, soft, non-tender, non-distended MUSCULOSKELETAL:  No significant edema, warm SKIN: Warm and dry NEUROLOGIC:  Alert and oriented x 3 PSYCHIATRIC:  Normal affect   ASSESSMENT:    1. DOE (dyspnea on exertion)   2. Pulmonary hypertension, unspecified ([place])   3. Chronic diastolic CHF (congestive heart failure) ([place])   4. Mixed hyperlipidemia   5. Morbid obesity ([place])   6. Essential hypertension   7. Other fatigue     PLAN:    In order of problems listed above:  #Fatigue: #DOE: Patient reports worsening fatigue over the past 1.5 months with mild worsening in chronic DOE. CTA in 2019 with no evidence of coronary disease and Ca score 0. RHC with mild pulmonary HTN. TTE in 09/2020 with normal BiV function. TSH normal. Appears euvolemic and compensated on exam today. Will repeat TTE to assess pulmonary pressures and check CBC and BNP. Given congestion, cough and worsening of symptoms when outside, will also refer to [facility] for further work-up. -Check TTE -Check CBC and BNP -TSH normal -Reassuring ischemic work-up -Will refer to Pulm for consideration of PFTs as symptoms worsen outside and she notes chest congestion/cough w   #Chronic Diastolic Heart Failure: Appears euvolemic on exam, however, given worsening DOE, will check TTE and BNP for further work-up. -Check BNP and TTE as above -Continue diuresis with lasix '20mg'$  as needed -Continue lisinopril '5mg'$  daily -Low Na diet -Continue weight loss efforst  #Pulmonary HTN: Thought to be secondary to diastolic dysfunction, obesity and OSA. Was normal on TTE in 09/2020. Given symptoms, will repeat as above for monitoring. -Repeat TTE for monitoring -Management of HTN as below -Continue weight loss efforts through exercise program -Compliant with home CPAP  #HTN: Well controlled and at goal <120s/80s. -Continue  lisinopril '5mg'$  daily -Continue diltiazem '180mg'$  daily -Continue HCTZ 12.'5mg'$  daily  #HLD: -Continue lipitor '20mg'$  daily -Followed by PCP  #Morbid Obesity: BMI 44.77. -Following with a weight loss clinic -Working hard on lifestyle modifications -On Ozempic  Exercise recommendations: Goal of exercising for at least 30 minutes a day, at least 5 times per week.  Please exercise to a moderate exertion.  This means that while exercising it is difficult to speak in full sentences, however you are not so short of breath that you feel you must stop, and not so comfortable that you can carry on a full conversation.  Exertion level should be approximately a 5/10, if 10 is the most exertion you can perform.  Diet recommendations: Recommend a heart healthy diet such as the Mediterranean diet.  This diet consists of plant based foods, healthy fats, lean meats, olive oil.  It suggests limiting the intake of simple carbohydrates such as white breads, pastries, and pastas.  It also limits the amount of red meat, wine, and dairy products such as cheese that one should consume on a daily basis.    Medication Adjustments/Labs and Tests Ordered: Current medicines are reviewed at length with the patient today.  Concerns regarding medicines are outlined above.  Orders Placed This Encounter  Procedures   Pro b natriuretic peptide (BNP)   CBC   Ambulatory referral to Pulmonology   EKG 12-Lead   ECHOCARDIOGRAM COMPLETE   No orders of the defined types were placed in this encounter.   Patient Instructions  Medication Instructions:  Your physician recommends that you continue on your current medications as directed. Please refer to the Current Medication list given to you today.  *If you need a refill on your cardiac medications before your next appointment, please call your pharmacy*  Lab Work: TODAY: BNP and CBC If you have labs (blood work) drawn today and your tests are completely normal, you will  receive your results only by: [place] (if you have MyChart) OR A paper copy in the mail If you have any lab test that is abnormal or we need to change your treatment, we will call you to review the results.  Testing/Procedures: Your physician has requested that you have an echocardiogram. Echocardiography is a painless test that uses sound waves to create images of your heart. It provides your doctor with information about the size and shape of your heart and how well your heart's chambers and valves are working. This procedure takes approximately one hour. There are no restrictions for this procedure.   Follow-Up: At [facility], you and your health needs are our priority.  As part of our continuing mission to provide you with exceptional heart care, we have created designated Provider Care Teams.  These Care Teams include your primary Cardiologist (physician) and Advanced Practice Providers (APPs -  Physician Assistants and Nurse Practitioners) who all work together to provide you with the care you need, when you need it.  Your next appointment:   3 month(s)  The format for your next appointment:   In Person  Provider:   Gwyndolyn Kaufman, MD or APP   Other Instructions You have been referred to see pulmonology  Important Information About Sugar          Signed, Freada Bergeron, MD  11/13/2021 10:13 AM    [place]

## 2021-11-11 NOTE — Telephone Encounter (Signed)
Pt is primarily calling to get her overdue appt with Dr. Johney Frame scheduled. Pt states she is on a weight loss journey and program, and has really amped up her exercise regimen.  She states she is also on ozempic now.  Pt states since increasing her exercise regimen over the last 3-4 weeks, she's noticed increased sob with workouts, out of the norm from how she usually feels when she exercises.   She states she is taking several different in-person exercise classes and dance classes. She states she is walking everyday to.  Pt states over the last 3-4 weeks she is noticing increased sob with exercise activity as well as increased fatigue.   She denies any chest pain at all.  She denies chest pain at rest, with exertion, and/or orthopnea.    She states she is not having increased edema, just her normal swelling that occurs when she's been sitting all day without her stockings on.  She states she's always dealt with mild edema, and this hasn't deviated out of her norm.  She denies palpitations, dizziness, diaphoresis, N/V, pre-syncopal or syncopal episodes. She did mention she has a mild and lingering cough, but unsure if this is allergy related or not.  She reports she does have pretty bad allergies.  She states her pressures and rates are great.  She states she is at a plateau with her weight, even on ozempic.  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  She states she does not feel volume overloaded at all.  She reports she tries to wear her compressions during the day.  Pt has a history of Pulmonary HTN and is a year overdue to see Dr. Johney Frame.    Advised the pt that we should get her into the clinic to see Dr. Johney Frame, for a more thorough and overdue work-up.   Scheduled the pt to see Dr. Johney Frame this Friday 8/4 at [place].  She is aware to arrive 15 mins prior to this appt.   Advised her to do modified exercise and/or rest for the next couple days, until she is seen  Friday.  Advised her to continue her current med regimen, compressions, elevating extremities at rest, and reducing her salt intake.    Pt aware I will forward this information to Dr. Johney Frame to make her aware of this plan.  Pt verbalized understanding and agrees with this plan.

## 2021-11-11 NOTE — Telephone Encounter (Signed)
Pt c/o Shortness Of Breath: STAT if SOB developed within the last 24 hours or pt is noticeably SOB on the phone  1. Are you currently SOB (can you hear that pt is SOB on the phone)? no  2. How long have you been experiencing SOB? last 3-4 weeks  3. Are you SOB when sitting or when up moving around? exercising  4. Are you currently experiencing any other symptoms? No  Patient states she has been exercising has getting SOB during her workouts. She says she also gets fatigue.

## 2021-11-11 NOTE — Telephone Encounter (Signed)
Yousra, Ivens - 11/11/2021 12:02 PM Freada Bergeron, MD  Sent: Wed November 11, 2021  1:24 PM  To: [name], LPN          Message  Thank you so much!!

## 2021-11-13 ENCOUNTER — Ambulatory Visit (INDEPENDENT_AMBULATORY_CARE_PROVIDER_SITE_OTHER): Payer: 59 | Admitting: Cardiology

## 2021-11-13 ENCOUNTER — Encounter: Payer: Self-pay | Admitting: Cardiology

## 2021-11-13 VITALS — BP 108/60 | HR 76 | Ht 71.0 in | Wt 321.0 lb

## 2021-11-13 DIAGNOSIS — R0609 Other forms of dyspnea: Secondary | ICD-10-CM | POA: Diagnosis not present

## 2021-11-13 DIAGNOSIS — I10 Essential (primary) hypertension: Secondary | ICD-10-CM

## 2021-11-13 DIAGNOSIS — I272 Pulmonary hypertension, unspecified: Secondary | ICD-10-CM | POA: Diagnosis not present

## 2021-11-13 DIAGNOSIS — E782 Mixed hyperlipidemia: Secondary | ICD-10-CM

## 2021-11-13 DIAGNOSIS — R5383 Other fatigue: Secondary | ICD-10-CM

## 2021-11-13 DIAGNOSIS — I5032 Chronic diastolic (congestive) heart failure: Secondary | ICD-10-CM

## 2021-11-13 NOTE — Patient Instructions (Signed)
Medication Instructions:  Your physician recommends that you continue on your current medications as directed. Please refer to the Current Medication list given to you today.  *If you need a refill on your cardiac medications before your next appointment, please call your pharmacy*  Lab Work: TODAY: BNP and CBC If you have labs (blood work) drawn today and your tests are completely normal, you will receive your results only by: [place] (if you have MyChart) OR A paper copy in the mail If you have any lab test that is abnormal or we need to change your treatment, we will call you to review the results.  Testing/Procedures: Your physician has requested that you have an echocardiogram. Echocardiography is a painless test that uses sound waves to create images of your heart. It provides your doctor with information about the size and shape of your heart and how well your heart's chambers and valves are working. This procedure takes approximately one hour. There are no restrictions for this procedure.   Follow-Up: At [facility], you and your health needs are our priority.  As part of our continuing mission to provide you with exceptional heart care, we have created designated Provider Care Teams.  These Care Teams include your primary Cardiologist (physician) and Advanced Practice Providers (APPs -  Physician Assistants and Nurse Practitioners) who all work together to provide you with the care you need, when you need it.  Your next appointment:   3 month(s)  The format for your next appointment:   In Person  Provider:   Gwyndolyn Kaufman, MD or APP   Other Instructions You have been referred to see pulmonology  Important Information About [place]

## 2021-11-14 ENCOUNTER — Encounter: Payer: Self-pay | Admitting: Cardiology

## 2021-11-14 DIAGNOSIS — R0609 Other forms of dyspnea: Secondary | ICD-10-CM

## 2021-11-14 LAB — CBC
Hematocrit: 37.4 % (ref 34.0–46.6)
Hemoglobin: 12.8 g/dL (ref 11.1–15.9)
MCH: 29.5 pg (ref 26.6–33.0)
MCHC: 34.2 g/dL (ref 31.5–35.7)
MCV: 86 fL (ref 79–97)
Platelets: 264 10*3/uL (ref 150–450)
RBC: 4.34 x10E6/uL (ref 3.77–5.28)
RDW: 13.9 % (ref 11.7–15.4)
WBC: 11.1 10*3/uL — ABNORMAL HIGH (ref 3.4–10.8)

## 2021-11-14 LAB — PRO B NATRIURETIC PEPTIDE: NT-Pro BNP: <36 pg/mL (ref 0–301)

## 2021-11-16 NOTE — Addendum Note (Signed)
Addended by: Stephani Police on: 11/16/2021 10:56 AM   Modules accepted: Orders

## 2021-11-18 ENCOUNTER — Telehealth: Payer: Self-pay

## 2021-11-18 ENCOUNTER — Other Ambulatory Visit: Payer: 59

## 2021-11-18 DIAGNOSIS — R7989 Other specified abnormal findings of blood chemistry: Secondary | ICD-10-CM

## 2021-11-18 DIAGNOSIS — R0609 Other forms of dyspnea: Secondary | ICD-10-CM

## 2021-11-18 LAB — D-DIMER, QUANTITATIVE: D-DIMER: 1.74 mg/L FEU — ABNORMAL HIGH (ref 0.00–0.49)

## 2021-11-18 NOTE — Telephone Encounter (Signed)
-----   Message from Freada Bergeron, MD sent at 11/18/2021  3:39 PM EDT ----- Looks like d-dimer is elevated. Can we get her a CTA PE protocol for further work-up.  Thank you! -Nira Conn

## 2021-11-18 NOTE — Telephone Encounter (Signed)
The patient has been notified of the result and verbalized understanding.  All questions (if any) were answered. Antonieta Iba, RN 11/18/2021 3:52 PM   CT scan has been ordered.

## 2021-11-19 ENCOUNTER — Encounter: Payer: Self-pay | Admitting: Cardiology

## 2021-11-19 ENCOUNTER — Ambulatory Visit (HOSPITAL_BASED_OUTPATIENT_CLINIC_OR_DEPARTMENT_OTHER)
Admission: RE | Admit: 2021-11-19 | Discharge: 2021-11-19 | Disposition: A | Discharge status: Home / Self Care | Payer: 59 | Source: Ambulatory Visit | Attending: Cardiology | Admitting: Cardiology

## 2021-11-19 ENCOUNTER — Telehealth: Payer: Self-pay

## 2021-11-19 ENCOUNTER — Telehealth: Payer: Self-pay | Admitting: Cardiology

## 2021-11-19 DIAGNOSIS — R7989 Other specified abnormal findings of blood chemistry: Secondary | ICD-10-CM | POA: Diagnosis not present

## 2021-11-19 DIAGNOSIS — R0609 Other forms of dyspnea: Secondary | ICD-10-CM

## 2021-11-19 LAB — POCT I-STAT CREATININE: Creatinine, Ser: 0.9 mg/dL (ref 0.44–1.00)

## 2021-11-19 MED ORDER — IOHEXOL 350 MG/ML SOLN
100.0000 mL | Freq: Once | INTRAVENOUS | Status: AC | PRN
  Administered 2021-11-19: 100 mL via INTRAVENOUS

## 2021-11-19 NOTE — Telephone Encounter (Signed)
Placed order for BMET for CT.

## 2021-11-19 NOTE — Telephone Encounter (Signed)
-----   Message from Armando Gang sent at 11/19/2021  2:32 PM EDT ----- Regarding: RE: stat CT Need labs  ----- Message ----- From: Armando Gang Sent: 11/19/2021  11:32 AM EDT To: Armando Gang Subject: FW: stat CT                                     ----- Message ----- From: Armando Gang Sent: 11/19/2021  10:22 AM EDT To: Greggory Brandy Abdul-Razzaaq Subject: stat CT                                        Patient is schedule for 3:30 @ [facility]

## 2021-11-19 NOTE — Telephone Encounter (Signed)
Patient called stating she is being sent for a CT scan for an elevated D-dimer.  She wants to know why if was put in as routine instead of STAT.  Her insurance company told her is was put in as routine. She would like a call back.

## 2021-11-19 NOTE — Telephone Encounter (Signed)
It should be stat. Thank you!

## 2021-11-20 LAB — BASIC METABOLIC PANEL
BUN/Creatinine Ratio: 11 — ABNORMAL LOW (ref 12–28)
BUN: 10 mg/dL (ref 8–27)
CO2: 21 mmol/L (ref 20–29)
Calcium: 9.1 mg/dL (ref 8.7–10.3)
Chloride: 105 mmol/L (ref 96–106)
Creatinine, Ser: 0.88 mg/dL (ref 0.57–1.00)
Glucose: 103 mg/dL — ABNORMAL HIGH (ref 70–99)
Potassium: 3.7 mmol/L (ref 3.5–5.2)
Sodium: 140 mmol/L (ref 134–144)
eGFR: 72 mL/min/{1.73_m2} (ref >59)

## 2021-12-01 NOTE — Progress Notes (Unsigned)
Synopsis: Referred for DOE by Freada Bergeron, MD  Subjective:   PATIENT ID: Melanie Hood: female DOB: Aug 05, 1955, MRN: [id number]  No chief complaint on file.  66yF with history of HTN, PH, HFpEF, Graves disease s/p RAI, OSA on CPAP, exercise induced asthma, AR referred for DOE  Otherwise pertinent review of systems is negative.  Past Medical History:  Diagnosis Date   Allergic rhinitis    Arthritis    Chronic back pain    Chronic diastolic CHF (congestive heart failure) (HCC)    Diabetes mellitus    type 11    Diverticular disease of colon    Exercise-induced asthma    GERD (gastroesophageal reflux disease)    Graves disease    status post radioactive iodine treatment   Hypertension    mild left vent dysfunction    Hypothyroidism    Morbid obesity (HCC)    Obesity    Pulmonary hypertension (HCC)    Sleep apnea    cpap-automatic settings   Vein, varicose      Family History  Problem Relation Age of Onset   Diabetes Mother    Diabetes Father    Colon cancer Sister    Breast cancer Neg Hx      Past Surgical History:  Procedure Laterality Date   ABDOMINAL HYSTERECTOMY     APPENDECTOMY     BREAST BIOPSY Left 1994   CHOLECYSTECTOMY     COLONOSCOPY N/A 07/18/2015   Procedure: COLONOSCOPY;  Surgeon: Teena Irani, MD;  Location: [facility];  Service: Endoscopy;  Laterality: N/A;   ESOPHAGOGASTRODUODENOSCOPY N/A 07/18/2015   Procedure: ESOPHAGOGASTRODUODENOSCOPY (EGD);  Surgeon: Teena Irani, MD;  Location: [facility];  Service: Endoscopy;  Laterality: N/A;   EYE SURGERY     FOOT ARTHROPLASTY     rt foot as child   KNEE ARTHROSCOPY  04/20/2011   Procedure: ARTHROSCOPY KNEE;  Surgeon: Hessie Dibble, MD;  Location: [place];  Service: Orthopedics;  Laterality: Right;  right knee arthroscopy partial medial menisectomy and chondroplasty.   RIGHT HEART CATH N/A 11/08/2017   Procedure: RIGHT HEART CATH;  Surgeon: Belva Crome, MD;   Location: [place] [facility];  Service: Cardiovascular;  Laterality: N/A;   TOTAL KNEE ARTHROPLASTY Right 12/11/2013   Procedure: TOTAL KNEE ARTHROPLASTY;  Surgeon: Hessie Dibble, MD;  Location: [place];  Service: Orthopedics;  Laterality: Right;   TOTAL KNEE ARTHROPLASTY Left 02/05/2014   Procedure: LEFT TOTAL KNEE ARTHROPLASTY;  Surgeon: Hessie Dibble, MD;  Location: [place];  Service: Orthopedics;  Laterality: Left;   UPPER GASTROINTESTINAL ENDOSCOPY     VARICOSE VEIN SURGERY      Social History   Socioeconomic History   Marital status: Single    Spouse name: Not on file   Number of children: Not on file   Years of education: Not on file   Highest education level: Not on file  Occupational History   Not on file  Tobacco Use   Smoking status: Former    Types: Cigarettes    Quit date: 04/12/1986    Years since quitting: 35.6   Smokeless tobacco: Never  Vaping Use   Vaping Use: Never used  Substance and Sexual Activity   Alcohol use: Yes    Alcohol/week: 0.0 standard drinks of alcohol    Comment: rarely    Drug use: No   Sexual activity: Not on file  Other Topics Concern   Not on file  Social History  Narrative   Not on file   Social Determinants of Health   Financial Resource Strain: Not on file  Food Insecurity: Not on file  Transportation Needs: Not on file  Physical Activity: Not on file  Stress: Not on file  Social Connections: Not on file  Intimate Partner Violence: Not on file     Allergies  Allergen Reactions   Septra [Sulfamethoxazole W/Trimethoprim (Co-Trimoxazole)] Other (See Comments)    Ht rate up   Metoprolol Tartrate Other (See Comments)    Memory loss/ panic attacks   Minocycline Nausea And Vomiting   Codeine Other (See Comments)    hallucinate   Erythromycin Other (See Comments)    abd pain   Nsaids Nausea And Vomiting   Septra [Sulfamethoxazole-Trimethoprim] Palpitations    Heart rate, breathing went up.    Tetracyclines & Related Nausea  And Vomiting     Outpatient Medications Prior to Visit  Medication Sig Dispense Refill   acetaminophen (TYLENOL) 650 MG CR tablet Take 650 mg by mouth every 12 (twelve) hours.     albuterol (VENTOLIN HFA) 108 (90 Base) MCG/ACT inhaler Inhale into the lungs every 6 (six) hours as needed for wheezing or shortness of breath.     aspirin 81 MG tablet Take 81 mg by mouth 2 (two) times a week.     atorvastatin (LIPITOR) 20 MG tablet Take 20 mg by mouth daily.     azelastine (ASTELIN) 0.1 % nasal spray Place 1 spray into both nostrils 2 (two) times daily as needed for rhinitis. Use in each nostril as directed     Biotin w/ Vitamins C & E (HAIR/SKIN/NAILS PO) Take by mouth daily.     cetirizine (ZYRTEC) 10 MG tablet Take 10 mg by mouth every evening.      Cholecalciferol (VITAMIN D-3) 5000 UNITS TABS Take 5,000 Units by mouth 2 (two) times a week.     citalopram (CELEXA) 20 MG tablet Take 20 mg by mouth every evening.      clobetasol ointment (TEMOVATE) 2.29 % Apply 1 application topically 2 (two) times daily. To affected area at hand for 3 weeks. Avoid applying to face, groin, and axilla. Use as directed. Risk of skin atrophy with long-term use reviewed. 30 g 1   diltiazem (CARDIZEM CD) 180 MG 24 hr capsule Take 1 capsule (180 mg total) by mouth daily. 90 capsule 2   fluticasone (FLONASE) 50 MCG/ACT nasal spray Place 2 sprays into both nostrils every morning.     hydrochlorothiazide (MICROZIDE) 12.5 MG capsule Take 12.5 mg by mouth every evening.      levothyroxine (SYNTHROID) 175 MCG tablet Take 150 mcg by mouth daily before breakfast.     lisinopril (ZESTRIL) 5 MG tablet Take 1 tablet (5 mg total) by mouth daily. 90 tablet 1   Magnesium Oxide (MAG-OX PO) Take 500 mg by mouth daily.     metFORMIN (GLUMETZA) 500 MG (MOD) 24 hr tablet Take 500 mg by mouth every evening.     Semaglutide, 2 MG/DOSE, (OZEMPIC, 2 MG/DOSE,) 8 MG/3ML SOPN Inject 2 mg into the skin once a week.     therapeutic  multivitamin-minerals (THERAGRAN-M) tablet Take 1 tablet by mouth every morning.     No facility-administered medications prior to visit.       Objective:   Physical Exam:  General appearance: 66 y.o., female, NAD, conversant, female, NAD, conversant  Eyes: anicteric sclerae; PERRL, tracking appropriately HENT: NCAT; MMM Neck: Trachea midline; no lymphadenopathy, no JVD Lungs: CTAB, no crackles, no  wheeze, with normal respiratory effort CV: RRR, no murmur  Abdomen: Soft, non-tender; non-distended, BS present  Extremities: No peripheral edema, warm Skin: Normal turgor and texture; no rash Psych: Appropriate affect Neuro: Alert and oriented to person and place, no focal deficit     There were no vitals filed for this visit.   on *** LPM *** RA BMI Readings from Last 3 Encounters:  11/13/21 44.77 kg/m  09/04/20 45.33 kg/m  05/30/19 45.19 kg/m   Wt Readings from Last 3 Encounters:  11/13/21 (!) 321 lb (145.6 kg)  09/04/20 (!) 325 lb (147.4 kg)  05/30/19 (!) 324 lb (147 kg)     CBC    Component Value Date/Time   WBC 11.1 (H) 11/13/2021 0929   WBC 8.0 10/07/2017 1257   RBC 4.34 11/13/2021 0929   RBC 4.29 10/07/2017 1257   HGB 12.8 11/13/2021 0929   HCT 37.4 11/13/2021 0929   PLT 264 11/13/2021 0929   MCV 86 11/13/2021 0929   MCH 29.5 11/13/2021 0929   MCH 28.7 10/07/2017 1257   MCHC 34.2 11/13/2021 0929   MCHC 31.3 10/07/2017 1257   RDW 13.9 11/13/2021 0929   LYMPHSABS 3.7 01/28/2014 1037   MONOABS 0.9 01/28/2014 1037   EOSABS 0.1 01/28/2014 1037   BASOSABS 0.0 01/28/2014 1037    Eos 100  Elevated d dimer  Chest Imaging: CTA Chest 11/19/21 reviewed by me with intrapulmonary LN minor fissure and a couple foci of lung scarring in bases otherwise unremarkable  Pulmonary Functions Testing Results:     No data to display          FeNO: ***  Pathology: ***  Echocardiogram:   TTE 10/02/20:  1. Left ventricular ejection fraction, by estimation, is 60 to 65%. Left   ventricular ejection fraction by 3D volume is 65 %. The left ventricle has  normal function. The left ventricle has no regional wall motion  abnormalities. There is mild concentric  left ventricular hypertrophy. Left ventricular diastolic parameters are  indeterminate.   2. Right ventricular systolic function is normal. The right ventricular  size is normal. There is normal pulmonary artery systolic pressure. The  estimated right ventricular systolic pressure is 32.9 mmHg.   3. The mitral valve is normal in structure. Trivial mitral valve  regurgitation. No evidence of mitral stenosis.   4. The aortic valve is grossly normal. There is mild calcification of the  aortic valve. Aortic valve regurgitation is not visualized. Mild aortic  valve sclerosis is present, with no evidence of aortic valve stenosis.   5. The inferior vena cava is normal in size with greater than 50%  respiratory variability, suggesting right atrial pressure of 3 mmHg.   Heart Catheterization 08/28/82:   RV Systolic Pressure 42 mmHg  RV Diastolic Pressure 11 mmHg  RV EDP 16 mmHg  PA Systolic Pressure 36 mmHg  PA Diastolic Pressure 23 mmHg  PA Mean 30 mmHg  PW A Wave 25 mmHg  PW V Wave 30 mmHg  PW Mean 22 mmHg     Index 2.65  Assessment & Plan:    Plan: - US dvt, tsh - PFTs     Maryjane Hurter, MD [facility] 12/01/2021 5:37 PM

## 2021-12-02 ENCOUNTER — Ambulatory Visit (HOSPITAL_COMMUNITY): Payer: 59 | Attending: Cardiovascular Disease

## 2021-12-02 DIAGNOSIS — I272 Pulmonary hypertension, unspecified: Secondary | ICD-10-CM | POA: Insufficient documentation

## 2021-12-02 DIAGNOSIS — R0609 Other forms of dyspnea: Secondary | ICD-10-CM | POA: Insufficient documentation

## 2021-12-02 DIAGNOSIS — I5032 Chronic diastolic (congestive) heart failure: Secondary | ICD-10-CM | POA: Diagnosis not present

## 2021-12-02 LAB — ECHOCARDIOGRAM COMPLETE
Area-P 1/2: 3.17 cm2
S' Lateral: 3.5 cm

## 2021-12-03 ENCOUNTER — Encounter: Payer: Self-pay | Admitting: Student

## 2021-12-03 ENCOUNTER — Ambulatory Visit (INDEPENDENT_AMBULATORY_CARE_PROVIDER_SITE_OTHER): Payer: 59 | Admitting: Student

## 2021-12-03 VITALS — BP 130/80 | HR 94 | Ht 70.0 in | Wt 324.4 lb

## 2021-12-03 DIAGNOSIS — R600 Localized edema: Secondary | ICD-10-CM

## 2021-12-03 DIAGNOSIS — R0609 Other forms of dyspnea: Secondary | ICD-10-CM | POA: Diagnosis not present

## 2021-12-03 NOTE — Patient Instructions (Signed)
-   albuterol 1-2 puffs as needed up to every 4-6 hours. Would take 5-20 minutes before exercise as well - we will schedule ultrasound of your legs to look for blood clot - we will schedule PFTs before your next visit - see you in 8 weeks or sooner if need be!

## 2021-12-04 ENCOUNTER — Ambulatory Visit (INDEPENDENT_AMBULATORY_CARE_PROVIDER_SITE_OTHER): Payer: 59

## 2021-12-04 DIAGNOSIS — R600 Localized edema: Secondary | ICD-10-CM | POA: Diagnosis not present

## 2022-01-21 ENCOUNTER — Other Ambulatory Visit (HOSPITAL_BASED_OUTPATIENT_CLINIC_OR_DEPARTMENT_OTHER): Payer: Self-pay

## 2022-01-21 MED ORDER — OZEMPIC (2 MG/DOSE) 8 MG/3ML ~~LOC~~ SOPN
2.0000 mg | PEN_INJECTOR | SUBCUTANEOUS | 1 refills | Status: DC
  Filled 2022-01-21: qty 3, 28d supply, fill #0
  Filled 2022-02-17: qty 3, 28d supply, fill #1

## 2022-01-22 ENCOUNTER — Other Ambulatory Visit (HOSPITAL_BASED_OUTPATIENT_CLINIC_OR_DEPARTMENT_OTHER): Payer: Self-pay

## 2022-01-28 ENCOUNTER — Ambulatory Visit: Payer: 59 | Admitting: Student

## 2022-02-17 ENCOUNTER — Ambulatory Visit: Payer: 59 | Admitting: Cardiology

## 2022-02-17 ENCOUNTER — Other Ambulatory Visit (HOSPITAL_BASED_OUTPATIENT_CLINIC_OR_DEPARTMENT_OTHER): Payer: Self-pay

## 2022-02-18 ENCOUNTER — Other Ambulatory Visit (HOSPITAL_BASED_OUTPATIENT_CLINIC_OR_DEPARTMENT_OTHER): Payer: Self-pay

## 2022-03-19 ENCOUNTER — Other Ambulatory Visit (HOSPITAL_BASED_OUTPATIENT_CLINIC_OR_DEPARTMENT_OTHER): Payer: Self-pay

## 2022-03-20 ENCOUNTER — Other Ambulatory Visit (HOSPITAL_BASED_OUTPATIENT_CLINIC_OR_DEPARTMENT_OTHER): Payer: Self-pay

## 2022-03-22 ENCOUNTER — Other Ambulatory Visit (HOSPITAL_BASED_OUTPATIENT_CLINIC_OR_DEPARTMENT_OTHER): Payer: Self-pay

## 2022-03-22 MED ORDER — OZEMPIC (2 MG/DOSE) 8 MG/3ML ~~LOC~~ SOPN
0.7500 mL | PEN_INJECTOR | SUBCUTANEOUS | 1 refills | Status: DC
  Filled 2022-03-22: qty 3, 28d supply, fill #0
  Filled 2022-04-20: qty 3, 28d supply, fill #1
  Filled 2022-05-17: qty 3, 28d supply, fill #2
  Filled 2022-06-16: qty 3, 28d supply, fill #3
  Filled 2022-07-13: qty 3, 28d supply, fill #4

## 2022-03-23 ENCOUNTER — Other Ambulatory Visit (HOSPITAL_BASED_OUTPATIENT_CLINIC_OR_DEPARTMENT_OTHER): Payer: Self-pay

## 2022-03-24 ENCOUNTER — Other Ambulatory Visit (HOSPITAL_BASED_OUTPATIENT_CLINIC_OR_DEPARTMENT_OTHER): Payer: Self-pay

## 2022-03-24 NOTE — Progress Notes (Unsigned)
Synopsis: Referred for DOE by Cari Caraway, MD  Subjective:   PATIENT ID: Melanie Hood: female DOB: 1955/06/10, MRN: [id number]  No chief complaint on file.  66yF with history of HTN, PH, HFpEF, Graves disease s/p RAI, OSA on CPAP, exercise induced asthma, AR, covid-19 04/2020 mild, referred for DOE  DOE for about a month and a half. Recently started working out. Also has dry cough, feels like she has chest congestion. Doesn't feel chest tightness when she works out. No wheeze. No CP. She has no dyspnea when she lies down flat. No LE swelling.   Just got albuterol refilled - hasn't tried it yet.   Says she does have seasonal allergy, has previously taken antihistamine, flonase, astelin.   Smoked for 16y 1ppd, quit 1988.   She has no family history of lung disease  She is a Marine scientist for [employer]. No MJ, vaping. She has cats.   Interval HPI PFTs  Otherwise pertinent review of systems is negative.  Past Medical History:  Diagnosis Date   Allergic rhinitis    Arthritis    Chronic back pain    Chronic diastolic CHF (congestive heart failure) (HCC)    Diabetes mellitus    type 11    Diverticular disease of colon    Exercise-induced asthma    GERD (gastroesophageal reflux disease)    Graves disease    status post radioactive iodine treatment   Hypertension    mild left vent dysfunction    Hypothyroidism    Morbid obesity (HCC)    Obesity    Pulmonary hypertension (HCC)    Sleep apnea    cpap-automatic settings   Vein, varicose      Family History  Problem Relation Age of Onset   Diabetes Mother    Diabetes Father    Colon cancer Sister    Breast cancer Neg Hx      Past Surgical History:  Procedure Laterality Date   ABDOMINAL HYSTERECTOMY     APPENDECTOMY     BREAST BIOPSY Left 1994   CHOLECYSTECTOMY     COLONOSCOPY N/A 07/18/2015   Procedure: COLONOSCOPY;  Surgeon: Teena Irani, MD;  Location: [facility];  Service: Endoscopy;  Laterality:  N/A;   ESOPHAGOGASTRODUODENOSCOPY N/A 07/18/2015   Procedure: ESOPHAGOGASTRODUODENOSCOPY (EGD);  Surgeon: Teena Irani, MD;  Location: [facility];  Service: Endoscopy;  Laterality: N/A;   EYE SURGERY     FOOT ARTHROPLASTY     rt foot as child   KNEE ARTHROSCOPY  04/20/2011   Procedure: ARTHROSCOPY KNEE;  Surgeon: Hessie Dibble, MD;  Location: [place];  Service: Orthopedics;  Laterality: Right;  right knee arthroscopy partial medial menisectomy and chondroplasty.   RIGHT HEART CATH N/A 11/08/2017   Procedure: RIGHT HEART CATH;  Surgeon: Belva Crome, MD;  Location: [place] [facility];  Service: Cardiovascular;  Laterality: N/A;   TOTAL KNEE ARTHROPLASTY Right 12/11/2013   Procedure: TOTAL KNEE ARTHROPLASTY;  Surgeon: Hessie Dibble, MD;  Location: [place];  Service: Orthopedics;  Laterality: Right;   TOTAL KNEE ARTHROPLASTY Left 02/05/2014   Procedure: LEFT TOTAL KNEE ARTHROPLASTY;  Surgeon: Hessie Dibble, MD;  Location: [place];  Service: Orthopedics;  Laterality: Left;   UPPER GASTROINTESTINAL ENDOSCOPY     VARICOSE VEIN SURGERY      Social History   Socioeconomic History   Marital status: Single    Spouse name: Not on file   Number of children: Not on file  Years of education: Not on file   Highest education level: Not on file  Occupational History   Not on file  Tobacco Use   Smoking status: Former    Types: Cigarettes    Quit date: 04/12/1986    Years since quitting: 35.9   Smokeless tobacco: Never  Vaping Use   Vaping Use: Never used  Substance and Sexual Activity   Alcohol use: Yes    Alcohol/week: 0.0 standard drinks of alcohol    Comment: rarely    Drug use: No   Sexual activity: Not on file  Other Topics Concern   Not on file  Social History Narrative   Not on file   Social Determinants of Health   Financial Resource Strain: Not on file  Food Insecurity: Not on file  Transportation Needs: Not on file  Physical Activity: Not on file   Stress: Not on file  Social Connections: Not on file  Intimate Partner Violence: Not on file     Allergies  Allergen Reactions   Septra [Sulfamethoxazole W/Trimethoprim (Co-Trimoxazole)] Other (See Comments)    Ht rate up   Metoprolol Tartrate Other (See Comments)    Memory loss/ panic attacks   Minocycline Nausea And Vomiting   Codeine Other (See Comments)    hallucinate   Erythromycin Other (See Comments)    abd pain   Nsaids Nausea And Vomiting   Septra [Sulfamethoxazole-Trimethoprim] Palpitations    Heart rate, breathing went up.    Tetracyclines & Related Nausea And Vomiting     Outpatient Medications Prior to Visit  Medication Sig Dispense Refill   acetaminophen (TYLENOL) 650 MG CR tablet Take 650 mg by mouth every 12 (twelve) hours.     albuterol (VENTOLIN HFA) 108 (90 Base) MCG/ACT inhaler Inhale into the lungs every 6 (six) hours as needed for wheezing or shortness of breath.     aspirin 81 MG tablet Take 81 mg by mouth 2 (two) times a week.     atorvastatin (LIPITOR) 20 MG tablet Take 20 mg by mouth daily.     azelastine (ASTELIN) 0.1 % nasal spray Place 1 spray into both nostrils 2 (two) times daily as needed for rhinitis. Use in each nostril as directed     Biotin w/ Vitamins C & E (HAIR/SKIN/NAILS PO) Take by mouth daily.     cetirizine (ZYRTEC) 10 MG tablet Take 10 mg by mouth every evening.      Cholecalciferol (VITAMIN D-3) 5000 UNITS TABS Take 5,000 Units by mouth 2 (two) times a week.     citalopram (CELEXA) 20 MG tablet Take 20 mg by mouth every evening.      clobetasol ointment (TEMOVATE) 7.61 % Apply 1 application topically 2 (two) times daily. To affected area at hand for 3 weeks. Avoid applying to face, groin, and axilla. Use as directed. Risk of skin atrophy with long-term use reviewed. (Patient not taking: Reported on 12/03/2021) 30 g 1   diltiazem (CARDIZEM CD) 180 MG 24 hr capsule Take 1 capsule (180 mg total) by mouth daily. 90 capsule 2   fluticasone  (FLONASE) 50 MCG/ACT nasal spray Place 2 sprays into both nostrils every morning. (Patient not taking: Reported on 12/03/2021)     hydrochlorothiazide (MICROZIDE) 12.5 MG capsule Take 12.5 mg by mouth every evening.      levothyroxine (SYNTHROID) 175 MCG tablet Take 150 mcg by mouth daily before breakfast.     lisinopril (ZESTRIL) 5 MG tablet Take 1 tablet (5 mg total) by mouth  daily. 90 tablet 1   Magnesium Oxide (MAG-OX PO) Take 500 mg by mouth daily.     metFORMIN (GLUMETZA) 500 MG (MOD) 24 hr tablet Take 500 mg by mouth every evening.     Semaglutide, 2 MG/DOSE, (OZEMPIC, 2 MG/DOSE,) 8 MG/3ML SOPN Inject 2 mg into the skin once a week.     Semaglutide, 2 MG/DOSE, (OZEMPIC, 2 MG/DOSE,) 8 MG/3ML SOPN Inject 2 mg into the skin once a week. 9 mL 1   Semaglutide, 2 MG/DOSE, (OZEMPIC, 2 MG/DOSE,) 8 MG/3ML SOPN Inject 0.75 milliliter Subcutaneous once a week. 9 mL 1   therapeutic multivitamin-minerals (THERAGRAN-M) tablet Take 1 tablet by mouth every morning. (Patient not taking: Reported on 12/03/2021)     No facility-administered medications prior to visit.       Objective:   Physical Exam:  General appearance: 66 y.o., female, NAD, conversant  Eyes: anicteric sclerae; PERRL, tracking appropriately HENT: NCAT; MMM Neck: Trachea midline; no lymphadenopathy, no JVD Lungs: CTAB, no crackles, no wheeze, with normal respiratory effort CV: RRR, no murmur  Abdomen: Soft, non-tender; non-distended, BS present  Extremities: No peripheral edema, warm Skin: Normal turgor and texture; no rash Psych: Appropriate affect Neuro: Alert and oriented to person and place, no focal deficit     There were no vitals filed for this visit.    on RA BMI Readings from Last 3 Encounters:  12/03/21 46.55 kg/m  11/13/21 44.77 kg/m  09/04/20 45.33 kg/m   Wt Readings from Last 3 Encounters:  12/03/21 (!) 324 lb 6.4 oz (147.1 kg)  11/13/21 (!) 321 lb (145.6 kg)  09/04/20 (!) 325 lb (147.4 kg)      CBC    Component Value Date/Time   WBC 11.1 (H) 11/13/2021 0929   WBC 8.0 10/07/2017 1257   RBC 4.34 11/13/2021 0929   RBC 4.29 10/07/2017 1257   HGB 12.8 11/13/2021 0929   HCT 37.4 11/13/2021 0929   PLT 264 11/13/2021 0929   MCV 86 11/13/2021 0929   MCH 29.5 11/13/2021 0929   MCH 28.7 10/07/2017 1257   MCHC 34.2 11/13/2021 0929   MCHC 31.3 10/07/2017 1257   RDW 13.9 11/13/2021 0929   LYMPHSABS 3.7 01/28/2014 1037   MONOABS 0.9 01/28/2014 1037   EOSABS 0.1 01/28/2014 1037   BASOSABS 0.0 01/28/2014 1037    Eos 100  Elevated d dimer  Chest Imaging: CTA Chest 11/19/21 reviewed by me with intrapulmonary LN minor fissure and a couple foci of lung scarring in bases otherwise unremarkable  Pulmonary Functions Testing Results:     No data to display           Echocardiogram:    TTE 12/02/21:  1. No significant change from 10/02/20.   2. Left ventricular ejection fraction, by estimation, is 60 to 65%. The  left ventricle has normal function. The left ventricle has no regional  wall motion abnormalities. Left ventricular diastolic parameters were  normal. The average left ventricular  global longitudinal strain is -18.9 %. The global longitudinal strain is  normal.   3. Right ventricular systolic function is normal. The right ventricular  size is normal.   4. The mitral valve is abnormal. Trivial mitral valve regurgitation. No  evidence of mitral stenosis.   5. The aortic valve is tricuspid. Aortic valve regurgitation is not  visualized. No aortic stenosis is present.   6. The inferior vena cava is normal in size with greater than 50%  respiratory variability, suggesting right atrial pressure of 3  mmHg.   TTE 10/02/20:  1. Left ventricular ejection fraction, by estimation, is 60 to 65%. Left  ventricular ejection fraction by 3D volume is 65 %. The left ventricle has  normal function. The left ventricle has no regional wall motion  abnormalities. There is mild  concentric  left ventricular hypertrophy. Left ventricular diastolic parameters are  indeterminate.   2. Right ventricular systolic function is normal. The right ventricular  size is normal. There is normal pulmonary artery systolic pressure. The  estimated right ventricular systolic pressure is 69.4 mmHg.   3. The mitral valve is normal in structure. Trivial mitral valve  regurgitation. No evidence of mitral stenosis.   4. The aortic valve is grossly normal. There is mild calcification of the  aortic valve. Aortic valve regurgitation is not visualized. Mild aortic  valve sclerosis is present, with no evidence of aortic valve stenosis.   5. The inferior vena cava is normal in size with greater than 50%  respiratory variability, suggesting right atrial pressure of 3 mmHg.   Heart Catheterization 8/54/62:   RV Systolic Pressure 42 mmHg  RV Diastolic Pressure 11 mmHg  RV EDP 16 mmHg  PA Systolic Pressure 36 mmHg  PA Diastolic Pressure 23 mmHg  PA Mean 30 mmHg  PW A Wave 25 mmHg  PW V Wave 30 mmHg  PW Mean 22 mmHg     Index 2.65  Assessment & Plan:   # DOE: Unclear if now having EIB symptoms again. Deconditioning also possible, angina less likely.  # OSA on CPAP Excellent adherence  Plan: - albuterol 1-2 puffs as needed up to every 4-6 hours. Would take 5-20 minutes before exercise as well - we will schedule ultrasound of your legs to look for blood clot - we will schedule PFTs before your next visit - see you in 8 weeks or sooner if need be!     Maryjane Hurter, MD [facility] 03/24/2022 12:34 PM

## 2022-03-25 ENCOUNTER — Other Ambulatory Visit (HOSPITAL_BASED_OUTPATIENT_CLINIC_OR_DEPARTMENT_OTHER): Payer: Self-pay

## 2022-03-25 ENCOUNTER — Ambulatory Visit (INDEPENDENT_AMBULATORY_CARE_PROVIDER_SITE_OTHER): Payer: 59 | Admitting: Student

## 2022-03-25 ENCOUNTER — Encounter: Payer: Self-pay | Admitting: Student

## 2022-03-25 VITALS — BP 122/70 | HR 68 | Temp 98.4°F | Ht 69.5 in | Wt 334.0 lb

## 2022-03-25 DIAGNOSIS — R0609 Other forms of dyspnea: Secondary | ICD-10-CM | POA: Diagnosis not present

## 2022-03-25 LAB — PULMONARY FUNCTION TEST
DL/VA % pred: 126 %
DL/VA: 5.05 ml/min/mmHg/L
DLCO cor % pred: 85 %
DLCO cor: 20.14 ml/min/mmHg
DLCO unc % pred: 85 %
DLCO unc: 20.14 ml/min/mmHg
FEF 25-75 Post: 2.21 L/sec
FEF 25-75 Pre: 1.67 L/sec
FEF2575-%Change-Post: 31 %
FEF2575-%Pred-Post: 93 %
FEF2575-%Pred-Pre: 70 %
FEV1-%Change-Post: 5 %
FEV1-%Pred-Post: 69 %
FEV1-%Pred-Pre: 66 %
FEV1-Post: 2.02 L
FEV1-Pre: 1.92 L
FEV1FVC-%Change-Post: 8 %
FEV1FVC-%Pred-Pre: 102 %
FEV6-%Change-Post: -3 %
FEV6-%Pred-Post: 65 %
FEV6-%Pred-Pre: 67 %
FEV6-Post: 2.38 L
FEV6-Pre: 2.45 L
FEV6FVC-%Change-Post: 0 %
FEV6FVC-%Pred-Post: 103 %
FEV6FVC-%Pred-Pre: 104 %
FVC-%Change-Post: -2 %
FVC-%Pred-Post: 62 %
FVC-%Pred-Pre: 64 %
FVC-Post: 2.39 L
FVC-Pre: 2.45 L
Post FEV1/FVC ratio: 85 %
Post FEV6/FVC ratio: 100 %
Pre FEV1/FVC ratio: 78 %
Pre FEV6/FVC Ratio: 100 %
RV % pred: 70 %
RV: 1.69 L
TLC % pred: 70 %
TLC: 4.14 L

## 2022-03-25 MED ORDER — ALBUTEROL SULFATE HFA 108 (90 BASE) MCG/ACT IN AERS: 1.0000 | INHALATION_SPRAY | Freq: Four times a day (QID) | RESPIRATORY_TRACT | 11 refills | Status: AC | PRN

## 2022-03-25 NOTE — Patient Instructions (Signed)
-   If cough persistent/bothersome or worse breathing with poorer air quality, etc come see [place] again

## 2022-03-25 NOTE — Progress Notes (Signed)
PFT done today. 

## 2022-03-26 ENCOUNTER — Other Ambulatory Visit: Payer: Self-pay | Admitting: Family Medicine

## 2022-03-26 DIAGNOSIS — Z1231 Encounter for screening mammogram for malignant neoplasm of breast: Secondary | ICD-10-CM

## 2022-04-20 ENCOUNTER — Other Ambulatory Visit (HOSPITAL_BASED_OUTPATIENT_CLINIC_OR_DEPARTMENT_OTHER): Payer: Self-pay

## 2022-04-22 ENCOUNTER — Other Ambulatory Visit (HOSPITAL_BASED_OUTPATIENT_CLINIC_OR_DEPARTMENT_OTHER): Payer: Self-pay

## 2022-04-28 ENCOUNTER — Ambulatory Visit: Payer: 59 | Admitting: Cardiology

## 2022-05-17 ENCOUNTER — Other Ambulatory Visit (HOSPITAL_BASED_OUTPATIENT_CLINIC_OR_DEPARTMENT_OTHER): Payer: Self-pay

## 2022-05-20 ENCOUNTER — Ambulatory Visit
Admission: RE | Admit: 2022-05-20 | Discharge: 2022-05-20 | Disposition: A | Discharge status: Home / Self Care | Payer: 59 | Source: Ambulatory Visit | Attending: Family Medicine | Admitting: Family Medicine

## 2022-05-20 DIAGNOSIS — Z1231 Encounter for screening mammogram for malignant neoplasm of breast: Secondary | ICD-10-CM

## 2022-06-16 ENCOUNTER — Other Ambulatory Visit (HOSPITAL_BASED_OUTPATIENT_CLINIC_OR_DEPARTMENT_OTHER): Payer: Self-pay

## 2022-07-13 ENCOUNTER — Other Ambulatory Visit: Payer: Self-pay

## 2022-07-13 ENCOUNTER — Other Ambulatory Visit (HOSPITAL_BASED_OUTPATIENT_CLINIC_OR_DEPARTMENT_OTHER): Payer: Self-pay

## 2022-08-05 ENCOUNTER — Other Ambulatory Visit (HOSPITAL_BASED_OUTPATIENT_CLINIC_OR_DEPARTMENT_OTHER): Payer: Self-pay

## 2022-08-05 MED ORDER — OZEMPIC (2 MG/DOSE) 8 MG/3ML ~~LOC~~ SOPN
2.0000 mg | PEN_INJECTOR | SUBCUTANEOUS | 3 refills | Status: DC
  Filled 2022-08-05: qty 3, 28d supply, fill #0
  Filled 2022-09-09: qty 3, 28d supply, fill #1
  Filled 2022-10-06: qty 3, 28d supply, fill #2

## 2022-08-11 ENCOUNTER — Other Ambulatory Visit (HOSPITAL_BASED_OUTPATIENT_CLINIC_OR_DEPARTMENT_OTHER): Payer: Self-pay

## 2022-09-09 ENCOUNTER — Other Ambulatory Visit (HOSPITAL_BASED_OUTPATIENT_CLINIC_OR_DEPARTMENT_OTHER): Payer: Self-pay

## 2022-10-06 ENCOUNTER — Other Ambulatory Visit (HOSPITAL_BASED_OUTPATIENT_CLINIC_OR_DEPARTMENT_OTHER): Payer: Self-pay

## 2022-10-28 ENCOUNTER — Other Ambulatory Visit (HOSPITAL_BASED_OUTPATIENT_CLINIC_OR_DEPARTMENT_OTHER): Payer: Self-pay

## 2022-10-28 MED ORDER — MOUNJARO 5 MG/0.5ML ~~LOC~~ SOAJ
5.0000 mg | SUBCUTANEOUS | 0 refills | Status: DC
  Filled 2022-11-25: qty 2, 28d supply, fill #0

## 2022-10-28 MED ORDER — MOUNJARO 2.5 MG/0.5ML ~~LOC~~ SOAJ
2.5000 mg | SUBCUTANEOUS | 0 refills | Status: DC
  Filled 2022-10-28: qty 2, 28d supply, fill #0

## 2022-10-28 MED ORDER — MOUNJARO 7.5 MG/0.5ML ~~LOC~~ SOAJ
7.5000 mg | SUBCUTANEOUS | 3 refills | Status: DC
  Filled 2022-12-22: qty 2, 28d supply, fill #0
  Filled 2023-01-19: qty 2, 28d supply, fill #1
  Filled 2023-02-15: qty 2, 28d supply, fill #2
  Filled 2023-03-17: qty 2, 28d supply, fill #3

## 2022-11-01 ENCOUNTER — Telehealth: Payer: Self-pay | Admitting: Cardiology

## 2022-11-01 NOTE — Telephone Encounter (Signed)
I called the pt to see if I could help her since Melanie Hood will not be back in the office until later in the week and she declined to talk to me.. she says she would wait for her return. I explained that she will be in clinic that day so she may not get a call back too early in the day and she says she is fine waiting.

## 2022-11-01 NOTE — Telephone Encounter (Signed)
Patient is requesting to speak with RN Lajoyce Corners and only wants to speak with RN Ivy. I did inform the patient that Lajoyce Corners is currently out of office until 07/25.

## 2022-11-03 NOTE — Telephone Encounter (Signed)
Jackalyn Lombard - 11/01/2022 10:15 AM [facility]  Sent: Wed November 03, 2022  1:33 PM  To: Loa Socks, LPN         Message  Patient is scheduled 01/26/23 at 1:40pm

## 2022-11-03 NOTE — Telephone Encounter (Signed)
Pt is calling asking for recommendations on a good female Cardiologist in our practice, she should establish care with being Dr. Shari Prows is leaving.   Informed the pt that we highly recommend Dr. Jodelle Red at our [facility] location, if she is ok being seen there.    Pt states she actually goes to the [facility] location frequently for other MD appts and uses their pharmacy quite often.  Pt would like to establish with Dr. Cristal Deer at [facility].   Pt states she is doing well from a cardiac standpoint, but will need to get a follow-up appt arranged with Dr. Cristal Deer in the near future, if open.   Informed the pt that I will route this message to Dr. Di Kindle scheduler Joyce Gross, and have her call her back to get her scheduled for an appt with Dr. Cristal Deer to establish with.  Pt verbalized understanding and agrees with this plan.

## 2022-11-19 ENCOUNTER — Other Ambulatory Visit (HOSPITAL_BASED_OUTPATIENT_CLINIC_OR_DEPARTMENT_OTHER): Payer: Self-pay

## 2022-11-25 ENCOUNTER — Other Ambulatory Visit (HOSPITAL_BASED_OUTPATIENT_CLINIC_OR_DEPARTMENT_OTHER): Payer: Self-pay

## 2022-12-22 ENCOUNTER — Other Ambulatory Visit (HOSPITAL_BASED_OUTPATIENT_CLINIC_OR_DEPARTMENT_OTHER): Payer: Self-pay

## 2023-01-19 ENCOUNTER — Other Ambulatory Visit (HOSPITAL_BASED_OUTPATIENT_CLINIC_OR_DEPARTMENT_OTHER): Payer: Self-pay

## 2023-01-19 ENCOUNTER — Other Ambulatory Visit (HOSPITAL_COMMUNITY): Payer: Self-pay

## 2023-01-26 ENCOUNTER — Encounter (HOSPITAL_BASED_OUTPATIENT_CLINIC_OR_DEPARTMENT_OTHER): Payer: Self-pay | Admitting: Cardiology

## 2023-01-26 ENCOUNTER — Other Ambulatory Visit (HOSPITAL_BASED_OUTPATIENT_CLINIC_OR_DEPARTMENT_OTHER): Payer: Self-pay

## 2023-01-26 ENCOUNTER — Ambulatory Visit (HOSPITAL_BASED_OUTPATIENT_CLINIC_OR_DEPARTMENT_OTHER): Payer: 59 | Admitting: Cardiology

## 2023-01-26 VITALS — BP 136/66 | HR 71 | Ht 71.0 in | Wt 317.0 lb

## 2023-01-26 DIAGNOSIS — E782 Mixed hyperlipidemia: Secondary | ICD-10-CM

## 2023-01-26 DIAGNOSIS — I5032 Chronic diastolic (congestive) heart failure: Secondary | ICD-10-CM | POA: Diagnosis not present

## 2023-01-26 DIAGNOSIS — E119 Type 2 diabetes mellitus without complications: Secondary | ICD-10-CM

## 2023-01-26 DIAGNOSIS — I10 Essential (primary) hypertension: Secondary | ICD-10-CM

## 2023-01-26 DIAGNOSIS — R0609 Other forms of dyspnea: Secondary | ICD-10-CM | POA: Diagnosis not present

## 2023-01-26 MED ORDER — COVID-19 MRNA VAC-TRIS(PFIZER) 30 MCG/0.3ML IM SUSY
0.3000 mL | PREFILLED_SYRINGE | Freq: Once | INTRAMUSCULAR | 0 refills | Status: AC
  Filled 2023-01-26: qty 0.3, 1d supply, fill #0

## 2023-01-26 MED ORDER — INFLUENZA VAC A&B SURF ANT ADJ 0.5 ML IM SUSY
0.5000 mL | PREFILLED_SYRINGE | Freq: Once | INTRAMUSCULAR | 0 refills | Status: AC
  Filled 2023-01-26: qty 0.5, 1d supply, fill #0

## 2023-01-26 NOTE — Progress Notes (Signed)
Cardiology Office Note:  .   Date:  01/26/2023  ID:  Melanie Hood, DOB 1955-06-11, MRN [id number] PCP: Gweneth Dimitri, MD  [facility] Providers Cardiologist:  Jodelle Red, MD {  History of Present Illness: .   Melanie Hood is a 67 y.o. female with PMH chronic diastolif heart failure, pulmonary hypertension, morbid obesity, type II diabetes, hypertension, sleep apnea on CPAP. She was previously seen by Dr. Shari Prows and Dr. Delton See; I met her 01/26/23  Pertinent CV history: Workup in 2019 for SOB showed negative venous duplex, CTPE negative for clot but showed dilated PA, echo with preserved EF, G1DD, unable to calculate PA pressures. CT coronary normal. RHC mild pulm hypertension likely 2/2 diastolic dysfunction with mildly elevated wedge pressure. Workup in 2022 showed echo with normal LVEF, normal RV, normal PASP.  Today: Has some mild dizziness mid day when changing position. No syncope. Saw pulmonology, told her lungs were ok. Losing weight with Mounjaro. Has an exercise room getting set up currently. Getting back into activity, working out 3x/week and gradually increasing for about 45-50 min a day.   Blood pressure well controlled at home.   On mounjaro. Starting weight 350 lbs (wasn't weighing regularly), was 335 lbs when she started ozempic but wasn't losing weight. Started on mounjaro, just started on her 3rd dose. Down to 317 lbs.   ROS: Denies chest pain, shortness of breath at rest. No PND, orthopnea, LE edema or unexpected weight gain. No syncope or palpitations. ROS otherwise negative except as noted.   Studies Reviewed: Marland Kitchen    EKG:  EKG Interpretation Date/Time:  Wednesday January 26 2023 13:56:21 EDT Ventricular Rate:  71 PR Interval:  198 QRS Duration:  72 QT Interval:  406 QTC Calculation: 441 R Axis:   42  Text Interpretation: Normal sinus rhythm Normal ECG Confirmed by Jodelle Red [phone]) on 01/26/2023 2:01:39 PM    Physical  Exam:   VS:  BP 136/66 (BP Location: Left Arm, Patient Position: Sitting, Cuff Size: Large)   Pulse 71   Ht 5\' 11"  (1.803 m)   Wt (!) 317 lb (143.8 kg)   SpO2 94%   BMI 44.21 kg/m    Wt Readings from Last 3 Encounters:  01/26/23 (!) 317 lb (143.8 kg)  03/25/22 (!) 334 lb (151.5 kg)  12/03/21 (!) 324 lb 6.4 oz (147.1 kg)    GEN: Well nourished, well developed in no acute distress HEENT: Normal, moist mucous membranes NECK: No JVD CARDIAC: regular rhythm, normal S1 and S2, no rubs or gallops. No murmur. VASCULAR: Radial and DP pulses 2+ bilaterally. No carotid bruits RESPIRATORY:  Clear to auscultation without rales, wheezing or rhonchi  ABDOMEN: Soft, non-tender, non-distended MUSCULOSKELETAL:  Ambulates independently SKIN: Warm and dry, no edema NEUROLOGIC:  Alert and oriented x 3. No focal neuro deficits noted. PSYCHIATRIC:  Normal affect    ASSESSMENT AND PLAN: .    Fatigue  Dyspnea on exertion -working to gradually increase conditioning  Chronic diastolic heart failure Morbid obesity Prior mild pulmonary hypertension, 2/2 diastolic dysfunction -stable, appear euvolemic  Hypertension -well controlled on diltiazem, hydrochlorothiazide, lisinopril  Hyperlipidemia Type II diabetes -on atorvastatin. Not on aspirin due to personal preference. -on mounjaro -KPN lipids 07/2022 Tchol 150, LDL 78, HDL 58, TG 75  CV risk counseling and prevention -recommend heart healthy/Mediterranean diet, with whole grains, fruits, vegetable, fish, lean meats, nuts, and olive oil. Limit salt. -recommend moderate walking, 3-5 times/week for 30-50 minutes each session. Aim for at least  150 minutes.week. Goal should be pace of 3 miles/hours, or walking 1.5 miles in 30 minutes -recommend avoidance of tobacco products. Avoid excess alcohol.  Dispo: 6 mos or sooner as needed  Signed, Jodelle Red, MD   Jodelle Red, MD, PhD, [facility] [place]  [facility]  [place]   Heart & Vascular at [facility] at [facility] [address] [place], [place] [address] [phone]

## 2023-01-26 NOTE — Patient Instructions (Signed)
Medication Instructions:  NO CHANGES  *If you need a refill on your cardiac medications before your next appointment, please call your pharmacy*   Lab Work: NONE If you have labs (blood work) drawn today and your tests are completely normal, you will receive your results only by: MyChart Message (if you have MyChart) OR A paper copy in the mail If you have any lab test that is abnormal or we need to change your treatment, we will call you to review the results.   Testing/Procedures: NONE   Follow-Up: At [facility], you and your health needs are our priority.  As part of our continuing mission to provide you with exceptional heart care, we have created designated Provider Care Teams.  These Care Teams include your primary Cardiologist (physician) and Advanced Practice Providers (APPs -  Physician Assistants and Nurse Practitioners) who all work together to provide you with the care you need, when you need it.  We recommend signing up for the patient portal called "MyChart".  Sign up information is provided on this After Visit Summary.  MyChart is used to connect with patients for Virtual Visits (Telemedicine).  Patients are able to view lab/test results, encounter notes, upcoming appointments, etc.  Non-urgent messages can be sent to your provider as well.   To learn more about what you can do with MyChart, go to [email or web address].    Your next appointment:   6 month(s)  Provider:   Jodelle Red, MD    Other Instructions NONE

## 2023-02-15 ENCOUNTER — Other Ambulatory Visit (HOSPITAL_BASED_OUTPATIENT_CLINIC_OR_DEPARTMENT_OTHER): Payer: Self-pay

## 2023-02-17 ENCOUNTER — Other Ambulatory Visit (HOSPITAL_BASED_OUTPATIENT_CLINIC_OR_DEPARTMENT_OTHER): Payer: Self-pay

## 2023-03-17 ENCOUNTER — Other Ambulatory Visit (HOSPITAL_BASED_OUTPATIENT_CLINIC_OR_DEPARTMENT_OTHER): Payer: Self-pay

## 2023-03-28 ENCOUNTER — Other Ambulatory Visit (HOSPITAL_BASED_OUTPATIENT_CLINIC_OR_DEPARTMENT_OTHER): Payer: Self-pay

## 2023-03-28 MED ORDER — MOUNJARO 10 MG/0.5ML ~~LOC~~ SOAJ
10.0000 mg | SUBCUTANEOUS | 1 refills | Status: DC
Start: 2023-03-28 — End: 2023-10-28
  Filled 2023-03-28 – 2023-04-07 (×2): qty 2, 28d supply, fill #0
  Filled 2023-05-10: qty 2, 28d supply, fill #1
  Filled 2023-06-08: qty 2, 28d supply, fill #2
  Filled 2023-07-05: qty 2, 28d supply, fill #3
  Filled 2023-08-01: qty 2, 28d supply, fill #4
  Filled 2023-10-27: qty 2, 28d supply, fill #5

## 2023-03-28 MED ORDER — ALBUTEROL SULFATE HFA 108 (90 BASE) MCG/ACT IN AERS
1.0000 | INHALATION_SPRAY | RESPIRATORY_TRACT | 1 refills | Status: AC | PRN
  Filled 2023-03-28: qty 6.7, 30d supply, fill #0
  Filled 2023-04-07: qty 6.7, 25d supply, fill #0

## 2023-04-07 ENCOUNTER — Other Ambulatory Visit (HOSPITAL_BASED_OUTPATIENT_CLINIC_OR_DEPARTMENT_OTHER): Payer: Self-pay

## 2023-05-10 ENCOUNTER — Other Ambulatory Visit (HOSPITAL_BASED_OUTPATIENT_CLINIC_OR_DEPARTMENT_OTHER): Payer: Self-pay

## 2023-06-08 ENCOUNTER — Other Ambulatory Visit (HOSPITAL_BASED_OUTPATIENT_CLINIC_OR_DEPARTMENT_OTHER): Payer: Self-pay

## 2023-06-09 ENCOUNTER — Other Ambulatory Visit (HOSPITAL_BASED_OUTPATIENT_CLINIC_OR_DEPARTMENT_OTHER): Payer: Self-pay

## 2023-07-05 ENCOUNTER — Other Ambulatory Visit (HOSPITAL_BASED_OUTPATIENT_CLINIC_OR_DEPARTMENT_OTHER): Payer: Self-pay

## 2023-07-06 ENCOUNTER — Other Ambulatory Visit: Payer: Self-pay | Admitting: Family Medicine

## 2023-07-06 DIAGNOSIS — Z1231 Encounter for screening mammogram for malignant neoplasm of breast: Secondary | ICD-10-CM

## 2023-07-27 ENCOUNTER — Ambulatory Visit

## 2023-08-01 ENCOUNTER — Other Ambulatory Visit (HOSPITAL_BASED_OUTPATIENT_CLINIC_OR_DEPARTMENT_OTHER): Payer: Self-pay

## 2023-08-03 ENCOUNTER — Ambulatory Visit
Admission: RE | Admit: 2023-08-03 | Discharge: 2023-08-03 | Disposition: A | Discharge status: Home / Self Care | Source: Ambulatory Visit | Attending: Family Medicine | Admitting: Family Medicine

## 2023-08-03 DIAGNOSIS — Z1231 Encounter for screening mammogram for malignant neoplasm of breast: Secondary | ICD-10-CM

## 2023-08-15 ENCOUNTER — Other Ambulatory Visit (HOSPITAL_BASED_OUTPATIENT_CLINIC_OR_DEPARTMENT_OTHER): Payer: Self-pay

## 2023-08-15 MED ORDER — MOUNJARO 12.5 MG/0.5ML ~~LOC~~ SOAJ
12.5000 mg | SUBCUTANEOUS | 3 refills | Status: DC
  Filled 2023-08-15 – 2023-08-31 (×2): qty 2, 28d supply, fill #0
  Filled 2023-09-29: qty 2, 28d supply, fill #1
  Filled 2023-10-26: qty 2, 28d supply, fill #2

## 2023-08-31 ENCOUNTER — Other Ambulatory Visit (HOSPITAL_BASED_OUTPATIENT_CLINIC_OR_DEPARTMENT_OTHER): Payer: Self-pay

## 2023-09-29 ENCOUNTER — Other Ambulatory Visit (HOSPITAL_BASED_OUTPATIENT_CLINIC_OR_DEPARTMENT_OTHER): Payer: Self-pay

## 2023-10-26 ENCOUNTER — Other Ambulatory Visit (HOSPITAL_BASED_OUTPATIENT_CLINIC_OR_DEPARTMENT_OTHER): Payer: Self-pay

## 2023-10-27 ENCOUNTER — Other Ambulatory Visit (HOSPITAL_BASED_OUTPATIENT_CLINIC_OR_DEPARTMENT_OTHER): Payer: Self-pay

## 2023-10-28 ENCOUNTER — Other Ambulatory Visit (HOSPITAL_BASED_OUTPATIENT_CLINIC_OR_DEPARTMENT_OTHER): Payer: Self-pay

## 2023-10-28 MED ORDER — MOUNJARO 10 MG/0.5ML ~~LOC~~ SOAJ
10.0000 mg | SUBCUTANEOUS | 5 refills | Status: DC
  Filled 2023-10-28: qty 6, 84d supply, fill #0
  Filled 2023-11-23 (×2): qty 2, 28d supply, fill #0
  Filled 2023-12-21: qty 2, 28d supply, fill #1
  Filled 2024-01-12: qty 2, 28d supply, fill #2

## 2023-11-23 ENCOUNTER — Other Ambulatory Visit: Payer: Self-pay

## 2023-11-23 ENCOUNTER — Other Ambulatory Visit (HOSPITAL_BASED_OUTPATIENT_CLINIC_OR_DEPARTMENT_OTHER): Payer: Self-pay

## 2023-11-30 ENCOUNTER — Other Ambulatory Visit: Payer: Self-pay | Admitting: Family Medicine

## 2023-11-30 DIAGNOSIS — M545 Low back pain, unspecified: Secondary | ICD-10-CM

## 2023-11-30 DIAGNOSIS — N3289 Other specified disorders of bladder: Secondary | ICD-10-CM

## 2023-12-01 ENCOUNTER — Ambulatory Visit
Admission: RE | Admit: 2023-12-01 | Discharge: 2023-12-01 | Disposition: A | Discharge status: Home / Self Care | Source: Ambulatory Visit | Attending: Family Medicine | Admitting: Family Medicine

## 2023-12-01 DIAGNOSIS — N3289 Other specified disorders of bladder: Secondary | ICD-10-CM

## 2023-12-01 DIAGNOSIS — M545 Low back pain, unspecified: Secondary | ICD-10-CM

## 2023-12-21 ENCOUNTER — Other Ambulatory Visit (HOSPITAL_BASED_OUTPATIENT_CLINIC_OR_DEPARTMENT_OTHER): Payer: Self-pay

## 2024-01-12 ENCOUNTER — Ambulatory Visit (HOSPITAL_BASED_OUTPATIENT_CLINIC_OR_DEPARTMENT_OTHER): Admitting: Cardiology

## 2024-01-12 ENCOUNTER — Encounter (HOSPITAL_BASED_OUTPATIENT_CLINIC_OR_DEPARTMENT_OTHER): Payer: Self-pay | Admitting: Cardiology

## 2024-01-12 ENCOUNTER — Other Ambulatory Visit: Payer: Self-pay

## 2024-01-12 ENCOUNTER — Other Ambulatory Visit (HOSPITAL_BASED_OUTPATIENT_CLINIC_OR_DEPARTMENT_OTHER): Payer: Self-pay

## 2024-01-12 VITALS — BP 114/67 | HR 91 | Resp 12 | Ht 71.0 in | Wt 303.0 lb

## 2024-01-12 DIAGNOSIS — E119 Type 2 diabetes mellitus without complications: Secondary | ICD-10-CM

## 2024-01-12 DIAGNOSIS — R0609 Other forms of dyspnea: Secondary | ICD-10-CM | POA: Diagnosis not present

## 2024-01-12 DIAGNOSIS — I5032 Chronic diastolic (congestive) heart failure: Secondary | ICD-10-CM | POA: Diagnosis not present

## 2024-01-12 DIAGNOSIS — E782 Mixed hyperlipidemia: Secondary | ICD-10-CM

## 2024-01-12 DIAGNOSIS — I10 Essential (primary) hypertension: Secondary | ICD-10-CM | POA: Diagnosis not present

## 2024-01-12 NOTE — Progress Notes (Signed)
 Cardiology Office Note:  .   Date:  01/12/2024  ID:  Melanie Hood, DOB [birth year], MRN [id number] PCP: Aisha Harvey, MD  [facility] Providers Cardiologist:  Shelda Bruckner, MD {  History of Present Illness: .   Melanie Hood is a 68 y.o. female with PMH chronic diastolic heart failure, secondary pulmonary hypertension, morbid obesity, type II diabetes, hypertension, sleep apnea on CPAP. She was previously seen by Dr. Hobart and Dr. Maranda; I met her 01/26/23  Pertinent CV history: Workup in 2019 for SOB showed negative venous duplex, CTPE negative for clot but showed dilated PA, echo with preserved EF, G1DD, unable to calculate PA pressures. CT coronary normal. RHC mild pulm hypertension likely 2/2 diastolic dysfunction with mildly elevated wedge pressure. Workup in 2022 showed echo with normal LVEF, normal RV, normal PASP.  Today: Working on increasing her exercise conditioning. Doing both cardiovascular and strength training. Still losing weight on GLP, though not as rapidly as she was expecting on Mounjaro , so she started [facility] weight loss clinic. Got two epidural injections yesterday with improvement in her back pain.  ROS: Denies chest pain, shortness of breath at rest. No PND, orthopnea, LE edema or unexpected weight gain. No syncope or palpitations. ROS otherwise negative except as noted.   Studies Reviewed: SABRA    EKG:  EKG Interpretation Date/Time:  Thursday January 12 2024 15:53:01 EDT Ventricular Rate:  83 PR Interval:  192 QRS Duration:  78 QT Interval:  392 QTC Calculation: 461 R Axis:   43  Text Interpretation: Sinus rhythm with sinus arrhythmia and PVC Low voltage QRS Nonspecific T wave abnormality Confirmed by Bruckner Shelda [phone]) on 01/12/2024 4:20:15 PM    Physical Exam:   VS:  BP 114/67 (BP Location: Left Arm, Patient Position: Sitting, Cuff Size: Large)   Pulse 91   Resp 12   Ht 5' 11 (1.803 m)   Wt (!) 303 lb (137.4 kg)    SpO2 95%   BMI 42.26 kg/m    Wt Readings from Last 3 Encounters:  01/12/24 (!) 303 lb (137.4 kg)  01/26/23 (!) 317 lb (143.8 kg)  03/25/22 (!) 334 lb (151.5 kg)    GEN: Well nourished, well developed in no acute distress HEENT: Normal, moist mucous membranes NECK: No JVD CARDIAC: regular rhythm, normal S1 and S2, no rubs or gallops. No murmur. VASCULAR: Radial and DP pulses 2+ bilaterally. No carotid bruits RESPIRATORY:  Clear to auscultation without rales, wheezing or rhonchi  ABDOMEN: Soft, non-tender, non-distended MUSCULOSKELETAL:  Ambulates independently SKIN: Warm and dry, no edema NEUROLOGIC:  Alert and oriented x 3. No focal neuro deficits noted. PSYCHIATRIC:  Normal affect    ASSESSMENT AND PLAN: .    Fatigue, Dyspnea on exertion -working to gradually increase conditioning, feels that she is improving symptom wise  Chronic diastolic heart failure Morbid obesity, current BMI 42 Prior mild pulmonary hypertension, 2/2 diastolic dysfunction -stable, appears euvolemic -starting weight over 350 lbs (she wasn't measuring after that), current weight 303 lbs -tolerating mounjaro  well  Hypertension -well controlled on diltiazem , hydrochlorothiazide , lisinopril   Hyperlipidemia Type II diabetes Primary prevention -on atorvastatin . Not on aspirin  due to personal preference. -on mounjaro  -KPN lipids 07/2023 Tchol 159, LDL 88, HDL 52, TG 105  CV risk counseling and prevention -recommend heart healthy/Mediterranean diet, with whole grains, fruits, vegetable, fish, lean meats, nuts, and olive oil. Limit salt. -recommend moderate walking, 3-5 times/week for 30-50 minutes each session. Aim for at least 150 minutes.week. Goal should be  pace of 3 miles/hours, or walking 1.5 miles in 30 minutes -recommend avoidance of tobacco products. Avoid excess alcohol.  Dispo: 12 mos or sooner as needed  Signed, Shelda Bruckner, MD   Shelda Bruckner, MD, PhD, [facility] [place]   [facility]  [place]  Heart & Vascular at [facility] at [facility] [address] [place], [place] [address] [phone]

## 2024-01-12 NOTE — Patient Instructions (Addendum)
 Medication Instructions:  No changes *If you need a refill on your cardiac medications before your next appointment, please call your pharmacy*  Lab Work: none  Testing/Procedures: none  Follow-Up: At [facility], you and your health needs are our priority.  As part of our continuing mission to provide you with exceptional heart care, our providers are all part of one team.  This team includes your primary Cardiologist (physician) and Advanced Practice Providers or APPs (Physician Assistants and Nurse Practitioners) who all work together to provide you with the care you need, when you need it.  Your next appointment:   12 month(s)  Provider:   Shelda Bruckner, MD

## 2024-01-27 ENCOUNTER — Other Ambulatory Visit (HOSPITAL_BASED_OUTPATIENT_CLINIC_OR_DEPARTMENT_OTHER): Payer: Self-pay

## 2024-02-08 ENCOUNTER — Other Ambulatory Visit (HOSPITAL_BASED_OUTPATIENT_CLINIC_OR_DEPARTMENT_OTHER): Payer: Self-pay

## 2024-02-16 ENCOUNTER — Other Ambulatory Visit (HOSPITAL_BASED_OUTPATIENT_CLINIC_OR_DEPARTMENT_OTHER): Payer: Self-pay

## 2024-03-29 ENCOUNTER — Other Ambulatory Visit (HOSPITAL_BASED_OUTPATIENT_CLINIC_OR_DEPARTMENT_OTHER): Payer: Self-pay

## 2024-03-29 MED ORDER — MOUNJARO 10 MG/0.5ML ~~LOC~~ SOAJ
10.0000 mg | SUBCUTANEOUS | 1 refills | Status: AC
  Filled 2024-03-29: qty 6, 84d supply, fill #0
  Filled 2024-04-09: qty 2, 28d supply, fill #0
  Filled 2024-05-03: qty 2, 28d supply, fill #1
  Filled ????-??-??: fill #0

## 2024-03-30 ENCOUNTER — Other Ambulatory Visit (HOSPITAL_BASED_OUTPATIENT_CLINIC_OR_DEPARTMENT_OTHER): Payer: Self-pay

## 2024-04-09 ENCOUNTER — Other Ambulatory Visit (HOSPITAL_BASED_OUTPATIENT_CLINIC_OR_DEPARTMENT_OTHER): Payer: Self-pay

## 2024-04-09 ENCOUNTER — Other Ambulatory Visit (HOSPITAL_COMMUNITY): Payer: Self-pay

## 2024-04-11 ENCOUNTER — Other Ambulatory Visit (HOSPITAL_BASED_OUTPATIENT_CLINIC_OR_DEPARTMENT_OTHER): Payer: Self-pay

## 2024-05-03 ENCOUNTER — Other Ambulatory Visit (HOSPITAL_BASED_OUTPATIENT_CLINIC_OR_DEPARTMENT_OTHER): Payer: Self-pay
